# Patient Record
Sex: Female | Born: 1955 | Race: Black or African American | Hispanic: No | Marital: Married | Smoking: Former smoker
Health system: Southern US, Community
[De-identification: ages and names within clinical notes are randomized; demographics above are authoritative.]

## PROBLEM LIST (undated history)

## (undated) DIAGNOSIS — R112 Nausea with vomiting, unspecified: Secondary | ICD-10-CM

## (undated) DIAGNOSIS — E7800 Pure hypercholesterolemia, unspecified: Secondary | ICD-10-CM

## (undated) DIAGNOSIS — Z9851 Tubal ligation status: Secondary | ICD-10-CM

## (undated) DIAGNOSIS — Z8679 Personal history of other diseases of the circulatory system: Secondary | ICD-10-CM

## (undated) DIAGNOSIS — Z9889 Other specified postprocedural states: Secondary | ICD-10-CM

## (undated) DIAGNOSIS — T8859XA Other complications of anesthesia, initial encounter: Secondary | ICD-10-CM

## (undated) DIAGNOSIS — T4145XA Adverse effect of unspecified anesthetic, initial encounter: Secondary | ICD-10-CM

## (undated) DIAGNOSIS — I10 Essential (primary) hypertension: Secondary | ICD-10-CM

## (undated) HISTORY — PX: COLONOSCOPY: SHX174

## (undated) HISTORY — PX: TUBAL LIGATION: SHX77

## (undated) HISTORY — PX: BREAST SURGERY: SHX581

---
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
## 2000-10-30 ENCOUNTER — Encounter: Payer: Self-pay | Admitting: Emergency Medicine

## 2000-10-30 ENCOUNTER — Emergency Department (HOSPITAL_COMMUNITY): Admission: EM | Admit: 2000-10-30 | Discharge: 2000-10-30 | Payer: Self-pay | Admitting: Emergency Medicine

## 2005-10-28 DIAGNOSIS — D126 Benign neoplasm of colon, unspecified: Secondary | ICD-10-CM

## 2005-10-28 HISTORY — DX: Benign neoplasm of colon, unspecified: D12.6

## 2009-08-31 ENCOUNTER — Other Ambulatory Visit: Admission: RE | Admit: 2009-08-31 | Discharge: 2009-08-31 | Payer: Self-pay | Admitting: Obstetrics and Gynecology

## 2009-08-31 ENCOUNTER — Ambulatory Visit: Payer: Self-pay | Admitting: Obstetrics and Gynecology

## 2009-09-15 ENCOUNTER — Ambulatory Visit: Payer: Self-pay | Admitting: Obstetrics & Gynecology

## 2010-03-16 ENCOUNTER — Ambulatory Visit: Payer: Self-pay | Admitting: Obstetrics & Gynecology

## 2010-10-12 ENCOUNTER — Ambulatory Visit: Payer: Self-pay | Admitting: Obstetrics and Gynecology

## 2010-10-12 LAB — CONVERTED CEMR LAB: Pap Smear: NEGATIVE

## 2011-04-12 ENCOUNTER — Ambulatory Visit: Payer: Self-pay | Admitting: Advanced Practice Midwife

## 2011-05-27 ENCOUNTER — Encounter: Payer: Self-pay | Admitting: Podiatry

## 2012-01-09 ENCOUNTER — Encounter (HOSPITAL_BASED_OUTPATIENT_CLINIC_OR_DEPARTMENT_OTHER): Payer: Self-pay | Admitting: *Deleted

## 2012-01-09 ENCOUNTER — Emergency Department (HOSPITAL_BASED_OUTPATIENT_CLINIC_OR_DEPARTMENT_OTHER)
Admission: EM | Admit: 2012-01-09 | Discharge: 2012-01-09 | Disposition: A | Discharge status: Home / Self Care | Payer: 59 | Attending: Emergency Medicine | Admitting: Emergency Medicine

## 2012-01-09 DIAGNOSIS — R109 Unspecified abdominal pain: Secondary | ICD-10-CM | POA: Insufficient documentation

## 2012-01-09 DIAGNOSIS — E860 Dehydration: Secondary | ICD-10-CM

## 2012-01-09 DIAGNOSIS — K529 Noninfective gastroenteritis and colitis, unspecified: Secondary | ICD-10-CM

## 2012-01-09 DIAGNOSIS — I10 Essential (primary) hypertension: Secondary | ICD-10-CM | POA: Insufficient documentation

## 2012-01-09 DIAGNOSIS — E785 Hyperlipidemia, unspecified: Secondary | ICD-10-CM | POA: Insufficient documentation

## 2012-01-09 DIAGNOSIS — Z9851 Tubal ligation status: Secondary | ICD-10-CM | POA: Insufficient documentation

## 2012-01-09 DIAGNOSIS — K5289 Other specified noninfective gastroenteritis and colitis: Secondary | ICD-10-CM | POA: Insufficient documentation

## 2012-01-09 DIAGNOSIS — R110 Nausea: Secondary | ICD-10-CM | POA: Insufficient documentation

## 2012-01-09 DIAGNOSIS — R197 Diarrhea, unspecified: Secondary | ICD-10-CM | POA: Insufficient documentation

## 2012-01-09 DIAGNOSIS — R748 Abnormal levels of other serum enzymes: Secondary | ICD-10-CM

## 2012-01-09 HISTORY — DX: Essential (primary) hypertension: I10

## 2012-01-09 HISTORY — DX: Pure hypercholesterolemia, unspecified: E78.00

## 2012-01-09 HISTORY — DX: Tubal ligation status: Z98.51

## 2012-01-09 LAB — COMPREHENSIVE METABOLIC PANEL
BUN: 11 mg/dL (ref 6–23)
CO2: 30 mEq/L (ref 19–32)
Calcium: 9.4 mg/dL (ref 8.4–10.5)
Creatinine, Ser: 0.8 mg/dL (ref 0.50–1.10)
GFR calc Af Amer: >90 mL/min (ref >90)
GFR calc non Af Amer: 81 mL/min — ABNORMAL LOW (ref >90)
Glucose, Bld: 88 mg/dL (ref 70–99)

## 2012-01-09 LAB — DIFFERENTIAL
Eosinophils Relative: 2 % (ref 0–5)
Lymphocytes Relative: 8 % — ABNORMAL LOW (ref 12–46)
Lymphs Abs: 0.4 10*3/uL — ABNORMAL LOW (ref 0.7–4.0)
Monocytes Absolute: 0.3 10*3/uL (ref 0.1–1.0)

## 2012-01-09 LAB — URINALYSIS, ROUTINE W REFLEX MICROSCOPIC
Hgb urine dipstick: NEGATIVE
Leukocytes, UA: NEGATIVE
Protein, ur: NEGATIVE mg/dL
Urobilinogen, UA: 0.2 mg/dL (ref 0.0–1.0)

## 2012-01-09 LAB — CBC
HCT: 36.2 % (ref 36.0–46.0)
MCV: 81.7 fL (ref 78.0–100.0)
RBC: 4.43 MIL/uL (ref 3.87–5.11)
WBC: 4.3 10*3/uL (ref 4.0–10.5)

## 2012-01-09 MED ORDER — SODIUM CHLORIDE 0.9 % IV BOLUS (SEPSIS)
2000.0000 mL | Freq: Once | INTRAVENOUS | Status: AC
  Administered 2012-01-09: 1000 mL via INTRAVENOUS

## 2012-01-09 MED ORDER — FAMOTIDINE IN NACL 20-0.9 MG/50ML-% IV SOLN
20.0000 mg | Freq: Once | INTRAVENOUS | Status: AC
  Administered 2012-01-09: 20 mg via INTRAVENOUS
  Filled 2012-01-09: qty 50

## 2012-01-09 MED ORDER — KETOROLAC TROMETHAMINE 30 MG/ML IJ SOLN
30.0000 mg | Freq: Once | INTRAMUSCULAR | Status: AC
  Administered 2012-01-09: 30 mg via INTRAVENOUS
  Filled 2012-01-09: qty 1

## 2012-01-09 MED ORDER — HYDROCODONE-ACETAMINOPHEN 5-325 MG PO TABS: 2.0000 | ORAL_TABLET | ORAL | Status: AC | PRN

## 2012-01-09 MED ORDER — ONDANSETRON HCL 4 MG/2ML IJ SOLN
4.0000 mg | Freq: Once | INTRAMUSCULAR | Status: AC
  Administered 2012-01-09: 4 mg via INTRAVENOUS
  Filled 2012-01-09: qty 2

## 2012-01-09 MED ORDER — SODIUM CHLORIDE 0.9 % IV SOLN
INTRAVENOUS | Status: DC
  Administered 2012-01-09: 1000 mL via INTRAVENOUS

## 2012-01-09 MED ORDER — ONDANSETRON HCL 4 MG PO TABS: 8.0000 mg | ORAL_TABLET | Freq: Three times a day (TID) | ORAL | Status: AC | PRN

## 2012-01-09 MED ORDER — HYDROMORPHONE HCL PF 1 MG/ML IJ SOLN
0.5000 mg | Freq: Once | INTRAMUSCULAR | Status: AC
  Administered 2012-01-09: 0.5 mg via INTRAVENOUS
  Filled 2012-01-09: qty 1

## 2012-01-09 MED ORDER — FAMOTIDINE 20 MG PO TABS: 20.0000 mg | ORAL_TABLET | Freq: Two times a day (BID) | ORAL | Status: AC | PRN

## 2012-01-09 NOTE — ED Notes (Signed)
Diarrhea, headache low grade temp nausea

## 2012-01-09 NOTE — ED Notes (Signed)
Pt discharged understanding prescriptions and follow up instructions. Rated her pain at 5/10. Patient states that she feels much better just weak. Transported to check out by wheelchair.

## 2012-01-09 NOTE — Discharge Instructions (Signed)
Clear Liquid Diet The clear liquid dietconsists of foods that are liquid or will become liquid at room temperature.You should be able to see through the liquid and beverages. Examples of foods allowed on a clear liquid diet include fruit juice, broth or bouillon, gelatin, or frozen ice pops. The purpose of this diet is to provide necessary fluid, electrolytes such as sodium and potassium, and energy to keep the body functioning during times when you are not able to consume a regular diet.A clear liquid diet should not be continued for long periods of time as it is not nutritionally adequate.  REASONS FOR USING A CLEAR LIQUID DIET  In sudden onset (acute) conditions for a patient before or after surgery.   As the first step in oral feeding.   For fluid and electrolyte replacement in diarrheal diseases.   As a diet before certain medical tests are performed.  ADEQUACY The clear liquid diet is adequate only in ascorbic acid, according to the Recommended Dietary Allowances of the [employer]. CHOOSING FOODS Breads and Starches  Allowed:  None are allowed.   Avoid: All are avoided.  Vegetables  Allowed:  Strained tomato or vegetable juice.   Avoid: Any others.  Fruit  Allowed:  Strained fruit juices and fruit drinks. Include 1 serving of citrus or vitamin C-enriched fruit juice daily.   Avoid: Any others.  Meat and Meat Substitutes  Allowed:  None are allowed.   Avoid: All are avoided.  Milk  Allowed:  None are allowed.   Avoid: All are avoided.  Soups and Combination Foods  Allowed:  Clear bouillon, broth, or strained broth-based soups.   Avoid: Any others.  Desserts and Sweets  Allowed:  Sugar, honey. High protein gelatin. Flavored gelatin, ices, or frozen ice pops that do not contain milk.   Avoid: Any others.  Fats and Oils  Allowed:  None are allowed.   Avoid: All are avoided.  Beverages  Allowed: Cereal beverages, coffee (regular or  decaffeinated), tea, or soda at the discretion of your caregiver.   Avoid: Any others.  Condiments  Allowed:  Iodized salt.   Avoid: Any others, including pepper.  Supplements  Allowed:  Liquid nutrition beverages.   Avoid: Any others that contain lactose or fiber.  SAMPLE MEAL PLAN Breakfast  4 oz (120 mL) strained orange juice.    to 1 cup (125 to 250 mL) gelatin (plain or fortified).   1 cup (250 mL) beverage (coffee or tea).   Sugar, if desired.  Midmorning Snack   cup (125 mL) gelatin (plain or fortified).  Lunch  1 cup (250 mL) broth or consomm.   4 oz (120 mL) strained grapefruit juice.    cup (125 mL) gelatin (plain or fortified).   1 cup (250 mL) beverage (coffee or tea).   Sugar, if desired.  Midafternoon Snack   cup (125 mL) fruit ice.    cup (125 mL) strained fruit juice.  Dinner  1 cup (250 mL) broth or consomm.    cup (125 mL) cranberry juice.    cup (125 mL) flavored gelatin (plain or fortified).   1 cup (250 mL) beverage (coffee or tea).   Sugar, if desired.  Evening Snack  4 oz (120 mL) strained apple juice (vitamin C-fortified).    cup (125 mL) flavored gelatin (plain or fortified).  Document Released: 10/14/2005 Document Revised: 10/03/2011 Document Reviewed: 01/11/2011 [facility] Patient Information 2012 [place], [place].Dehydration, Adult Dehydration is when you lose more fluids from the body  than you take in. Vital organs like the kidneys, brain, and heart cannot function without a proper amount of fluids and salt. Any loss of fluids from the body can cause dehydration.  CAUSES   Vomiting.   Diarrhea.   Excessive sweating.   Excessive urine output.   Fever.  SYMPTOMS  Mild dehydration  Thirst.   Dry lips.   Slightly dry mouth.  Moderate dehydration  Very dry mouth.   Sunken eyes.   Skin does not bounce back quickly when lightly pinched and released.   Dark urine and decreased urine production.    Decreased tear production.   Headache.  Severe dehydration  Very dry mouth.   Extreme thirst.   Rapid, weak pulse (more than 100 beats per minute at rest).   Cold hands and feet.   Not able to sweat in spite of heat and temperature.   Rapid breathing.   Blue lips.   Confusion and lethargy.   Difficulty being awakened.   Minimal urine production.   No tears.  DIAGNOSIS  Your caregiver will diagnose dehydration based on your symptoms and your exam. Blood and urine tests will help confirm the diagnosis. The diagnostic evaluation should also identify the cause of dehydration. TREATMENT  Treatment of mild or moderate dehydration can often be done at home by increasing the amount of fluids that you drink. It is best to drink small amounts of fluid more often. Drinking too much at one time can make vomiting worse. Refer to the home care instructions below. Severe dehydration needs to be treated at the hospital where you will probably be given intravenous (IV) fluids that contain water and electrolytes. HOME CARE INSTRUCTIONS   Ask your caregiver about specific rehydration instructions.   Drink enough fluids to keep your urine clear or pale yellow.   Drink small amounts frequently if you have nausea and vomiting.   Eat as you normally do.   Avoid:   Foods or drinks high in sugar.   Carbonated drinks.   Juice.   Extremely hot or cold fluids.   Drinks with caffeine.   Fatty, greasy foods.   Alcohol.   Tobacco.   Overeating.   Gelatin desserts.   Wash your hands well to avoid spreading bacteria and viruses.   Only take over-the-counter or prescription medicines for pain, discomfort, or fever as directed by your caregiver.   Ask your caregiver if you should continue all prescribed and over-the-counter medicines.   Keep all follow-up appointments with your caregiver.  SEEK MEDICAL CARE IF:  You have abdominal pain and it increases or stays in one area  (localizes).   You have a rash, stiff neck, or severe headache.   You are irritable, sleepy, or difficult to awaken.   You are weak, dizzy, or extremely thirsty.  SEEK IMMEDIATE MEDICAL CARE IF:   You are unable to keep fluids down or you get worse despite treatment.   You have frequent episodes of vomiting or diarrhea.   You have blood or green matter (bile) in your vomit.   You have blood in your stool or your stool looks black and tarry.   You have not urinated in 6 to 8 hours, or you have only urinated a small amount of very dark urine.   You have a fever.   You faint.  MAKE SURE YOU:   Understand these instructions.   Will watch your condition.   Will get help right away if you are not doing well  or get worse.  Document Released: 10/14/2005 Document Revised: 10/03/2011 Document Reviewed: 06/03/2011 [facility] Patient Information 2012 [place], [place].Viral Gastroenteritis Viral gastroenteritis is also known as stomach flu. This condition affects the stomach and intestinal tract. It can cause sudden diarrhea and vomiting. The illness typically lasts 3 to 8 days. Most people develop an immune response that eventually gets rid of the virus. While this natural response develops, the virus can make you quite ill. CAUSES  Many different viruses can cause gastroenteritis, such as rotavirus or noroviruses. You can catch one of these viruses by consuming contaminated food or water. You may also catch a virus by sharing utensils or other personal items with an infected person or by touching a contaminated surface. SYMPTOMS  The most common symptoms are diarrhea and vomiting. These problems can cause a severe loss of body fluids (dehydration) and a body salt (electrolyte) imbalance. Other symptoms may include:  Fever.   Headache.   Fatigue.   Abdominal pain.  DIAGNOSIS  Your caregiver can usually diagnose viral gastroenteritis based on your symptoms and a physical exam. A stool  sample may also be taken to test for the presence of viruses or other infections. TREATMENT  This illness typically goes away on its own. Treatments are aimed at rehydration. The most serious cases of viral gastroenteritis involve vomiting so severely that you are not able to keep fluids down. In these cases, fluids must be given through an intravenous line (IV). HOME CARE INSTRUCTIONS   Drink enough fluids to keep your urine clear or pale yellow. Drink small amounts of fluids frequently and increase the amounts as tolerated.   Ask your caregiver for specific rehydration instructions.   Avoid:   Foods high in sugar.   Alcohol.   Carbonated drinks.   Tobacco.   Juice.   Caffeine drinks.   Extremely hot or cold fluids.   Fatty, greasy foods.   Too much intake of anything at one time.   Dairy products until 24 to 48 hours after diarrhea stops.   You may consume probiotics. Probiotics are active cultures of beneficial bacteria. They may lessen the amount and number of diarrheal stools in adults. Probiotics can be found in yogurt with active cultures and in supplements.   Wash your hands well to avoid spreading the virus.   Only take over-the-counter or prescription medicines for pain, discomfort, or fever as directed by your caregiver. Do not give aspirin to children. Antidiarrheal medicines are not recommended.   Ask your caregiver if you should continue to take your regular prescribed and over-the-counter medicines.   Keep all follow-up appointments as directed by your caregiver.  SEEK IMMEDIATE MEDICAL CARE IF:   You are unable to keep fluids down.   You do not urinate at least once every 6 to 8 hours.   You develop shortness of breath.   You notice blood in your stool or vomit. This may look like coffee grounds.   You have abdominal pain that increases or is concentrated in one small area (localized).   You have persistent vomiting or diarrhea.   You have a  fever.   The patient is a child younger than 3 months, and he or she has a fever.   The patient is a child older than 3 months, and he or she has a fever and persistent symptoms.   The patient is a child older than 3 months, and he or she has a fever and symptoms suddenly get worse.  The patient is a baby, and he or she has no tears when crying.  MAKE SURE YOU:   Understand these instructions.   Will watch your condition.   Will get help right away if you are not doing well or get worse.  Document Released: 10/14/2005 Document Revised: 10/03/2011 Document Reviewed: 07/31/2011 [facility] Patient Information 2012 [place], [place].Viral Gastroenteritis Viral gastroenteritis is also known as stomach flu. This condition affects the stomach and intestinal tract. It can cause sudden diarrhea and vomiting. The illness typically lasts 3 to 8 days. Most people develop an immune response that eventually gets rid of the virus. While this natural response develops, the virus can make you quite ill. CAUSES  Many different viruses can cause gastroenteritis, such as rotavirus or noroviruses. You can catch one of these viruses by consuming contaminated food or water. You may also catch a virus by sharing utensils or other personal items with an infected person or by touching a contaminated surface. SYMPTOMS  The most common symptoms are diarrhea and vomiting. These problems can cause a severe loss of body fluids (dehydration) and a body salt (electrolyte) imbalance. Other symptoms may include:  Fever.   Headache.   Fatigue.   Abdominal pain.  DIAGNOSIS  Your caregiver can usually diagnose viral gastroenteritis based on your symptoms and a physical exam. A stool sample may also be taken to test for the presence of viruses or other infections. TREATMENT  This illness typically goes away on its own. Treatments are aimed at rehydration. The most serious cases of viral gastroenteritis involve vomiting so  severely that you are not able to keep fluids down. In these cases, fluids must be given through an intravenous line (IV). HOME CARE INSTRUCTIONS   Drink enough fluids to keep your urine clear or pale yellow. Drink small amounts of fluids frequently and increase the amounts as tolerated.   Ask your caregiver for specific rehydration instructions.   Avoid:   Foods high in sugar.   Alcohol.   Carbonated drinks.   Tobacco.   Juice.   Caffeine drinks.   Extremely hot or cold fluids.   Fatty, greasy foods.   Too much intake of anything at one time.   Dairy products until 24 to 48 hours after diarrhea stops.   You may consume probiotics. Probiotics are active cultures of beneficial bacteria. They may lessen the amount and number of diarrheal stools in adults. Probiotics can be found in yogurt with active cultures and in supplements.   Wash your hands well to avoid spreading the virus.   Only take over-the-counter or prescription medicines for pain, discomfort, or fever as directed by your caregiver. Do not give aspirin to children. Antidiarrheal medicines are not recommended.   Ask your caregiver if you should continue to take your regular prescribed and over-the-counter medicines.   Keep all follow-up appointments as directed by your caregiver.  SEEK IMMEDIATE MEDICAL CARE IF:   You are unable to keep fluids down.   You do not urinate at least once every 6 to 8 hours.   You develop shortness of breath.   You notice blood in your stool or vomit. This may look like coffee grounds.   You have abdominal pain that increases or is concentrated in one small area (localized).   You have persistent vomiting or diarrhea.   You have a fever.   The patient is a child younger than 3 months, and he or she has a fever.   The patient is  a child older than 3 months, and he or she has a fever and persistent symptoms.   The patient is a child older than 3 months, and he or she has a  fever and symptoms suddenly get worse.   The patient is a baby, and he or she has no tears when crying.  MAKE SURE YOU:   Understand these instructions.   Will watch your condition.   Will get help right away if you are not doing well or get worse.  Document Released: 10/14/2005 Document Revised: 10/03/2011 Document Reviewed: 07/31/2011 [facility] Patient Information 2012 [place], [place].

## 2012-01-09 NOTE — ED Provider Notes (Addendum)
History     CSN: [id number]  Arrival date & time 01/09/12  1220   First MD Initiated Contact with Patient 01/09/12 1318      Chief Complaint  Patient presents with  . Nausea  . Diarrhea    (Consider location/radiation/quality/duration/timing/severity/associated sxs/prior treatment) Patient is a 56 y.o. female presenting with vomiting. The history is provided by the patient.  Emesis  This is a new problem. The current episode started 12 to 24 hours ago. The problem occurs 5 to 10 times per day. The problem has not changed since onset.The emesis has an appearance of stomach contents. The maximum temperature recorded prior to her arrival was 100 to 100.9 F. The fever has been present for less than 1 day. Associated symptoms include abdominal pain, chills, diarrhea, a fever and headaches. Pertinent negatives include no arthralgias, no cough, no myalgias, no sweats and no URI.    Past Medical History  Diagnosis Date  . Hypertension   . High cholesterol   . Tubal ligation status     Past Surgical History  Procedure Date  . Colonoscopy     No family history on file.  History  Substance Use Topics  . Smoking status: Not on file  . Smokeless tobacco: Not on file  . Alcohol Use:     OB History    Grav Para Term Preterm Abortions TAB SAB Ect Mult Living                  Review of Systems  Constitutional: Positive for fever, chills, appetite change and fatigue. Negative for diaphoresis.  HENT: Positive for sore throat. Negative for congestion, rhinorrhea, trouble swallowing, neck pain, neck stiffness, voice change and postnasal drip.   Eyes: Negative.   Respiratory: Negative for cough, chest tightness and shortness of breath.   Cardiovascular: Negative for chest pain.  Gastrointestinal: Positive for nausea, vomiting, abdominal pain and diarrhea. Negative for constipation, blood in stool, abdominal distention and anal bleeding.  Genitourinary: Negative for dysuria.    Musculoskeletal: Negative for myalgias, back pain, joint swelling and arthralgias.  Skin: Negative.   Neurological: Positive for light-headedness and headaches. Negative for dizziness and syncope.  Hematological: Negative.   Psychiatric/Behavioral: Negative.     Allergies  Codeine; Darvocet; and Morphine and related  Home Medications   Current Outpatient Rx  Name Route Sig Dispense Refill  . LOVASTATIN 20 MG PO TABS Oral Take 20 mg by mouth at bedtime.    Marland Kitchen OLMESARTAN MEDOXOMIL 20 MG PO TABS Oral Take 20 mg by mouth daily.      BP 111/73  Pulse 80  Temp(Src) 99 F (37.2 C) (Oral)  Resp 18  Ht 5\' 7"  (1.702 m)  Wt 160 lb (72.576 kg)  BMI 25.06 kg/m2  SpO2 99%  Physical Exam  Nursing note and vitals reviewed. Constitutional: She is oriented to person, place, and time. She appears well-developed and well-nourished. She is active.  Non-toxic appearance. She does not have a sickly appearance. She does not appear ill. She appears distressed.  HENT:  Head: Normocephalic and atraumatic.  Right Ear: Hearing, tympanic membrane, external ear and ear canal normal.  Left Ear: Hearing, tympanic membrane, external ear and ear canal normal.  Nose: Nose normal. No mucosal edema.  Mouth/Throat: Uvula is midline. Mucous membranes are dry. No oral lesions. No uvula swelling. No oropharyngeal exudate, posterior oropharyngeal edema, posterior oropharyngeal erythema or tonsillar abscesses.       No suggestion of pharyngitis or tonsillitis.  Eyes:  Conjunctivae and EOM are normal. Pupils are equal, round, and reactive to light. Right eye exhibits no chemosis, no discharge and no exudate. Left eye exhibits no chemosis, no discharge and no exudate. Right conjunctiva is not injected. Left conjunctiva is not injected. No scleral icterus.  Neck: Normal range of motion, full passive range of motion without pain and phonation normal. Neck supple. No rigidity. No Brudzinski's sign noted.  Cardiovascular:  Normal rate, regular rhythm, intact distal pulses and normal pulses.   No extrasystoles are present.  Pulmonary/Chest: Effort normal and breath sounds normal. No accessory muscle usage. Not tachypneic. No respiratory distress. She has no decreased breath sounds. She has no wheezes. She has no rhonchi. She has no rales. She exhibits no tenderness, no crepitus and no retraction.  Abdominal: Soft. Normal appearance. She exhibits no shifting dullness, no distension, no pulsatile liver, no fluid wave, no abdominal bruit, no ascites, no pulsatile midline mass and no mass. Bowel sounds are increased. There is no hepatosplenomegaly. There is generalized tenderness. There is no rigidity, no rebound, no guarding, no CVA tenderness, no tenderness at McBurney's point and negative Murphy's sign. No hernia.  Musculoskeletal: Normal range of motion.  Neurological: She is alert and oriented to person, place, and time. She has normal strength and normal reflexes. She is not disoriented. No cranial nerve deficit. Coordination normal. GCS eye subscore is 4. GCS verbal subscore is 5. GCS motor subscore is 6.  Skin: Skin is warm, dry and intact. No bruising, no ecchymosis, no lesion and no rash noted. She is not diaphoretic. No erythema. No pallor.  Psychiatric: She has a normal mood and affect. Her speech is normal and behavior is normal. Judgment and thought content normal. Cognition and memory are normal.    ED Course  Procedures (including critical care time)   Labs Reviewed  CBC  DIFFERENTIAL  COMPREHENSIVE METABOLIC PANEL  LIPASE, BLOOD  URINALYSIS, ROUTINE W REFLEX MICROSCOPIC   No results found.   No diagnosis found.    MDM  The patient appears to have most likely a viral gastroenteritis with mild dehydration. I will give her IV fluid repletion, anti-medics, antacids, analgesics through her IV. I will assess her electrolytes, her liver enzymes, pancreatic enzyme, kidney function, urine looking for  electrolyte abnormality, evidence of cholecystitis, choledocholithiasis, pancreatitis, renal failure, or urinary tract infection.  My overwhelming suspicion however is for uncomplicated gastroenteritis of a viral etiology with mild dehydration.        Felisa Bonier, MD 01/09/12 1423  3:19 PM At this time the patient has had no further vomiting, has no further nausea, reports improved abdominal discomfort, and is feeling well. She is only now getting the beginning of her first of 2 L of saline IV infusion to rehydrate her. She is interested in trying an oral fluid challenge and she will need more time to complete her IV fluid boluses, but at that time will be able to be discharged. Her liver enzymes are mildly inflamed, and this is attributed to likely viral etiology and not thought to represent acute cholecystitis or choledocholithiasis.  Felisa Bonier, MD 01/09/12 1520

## 2012-01-20 ENCOUNTER — Encounter (HOSPITAL_BASED_OUTPATIENT_CLINIC_OR_DEPARTMENT_OTHER): Payer: Self-pay | Admitting: Emergency Medicine

## 2012-01-20 ENCOUNTER — Emergency Department (HOSPITAL_BASED_OUTPATIENT_CLINIC_OR_DEPARTMENT_OTHER)
Admission: EM | Admit: 2012-01-20 | Discharge: 2012-01-20 | Disposition: A | Discharge status: Home / Self Care | Payer: 59 | Attending: Emergency Medicine | Admitting: Emergency Medicine

## 2012-01-20 ENCOUNTER — Emergency Department (INDEPENDENT_AMBULATORY_CARE_PROVIDER_SITE_OTHER): Payer: 59

## 2012-01-20 DIAGNOSIS — R0989 Other specified symptoms and signs involving the circulatory and respiratory systems: Secondary | ICD-10-CM

## 2012-01-20 DIAGNOSIS — I10 Essential (primary) hypertension: Secondary | ICD-10-CM | POA: Insufficient documentation

## 2012-01-20 DIAGNOSIS — R509 Fever, unspecified: Secondary | ICD-10-CM | POA: Insufficient documentation

## 2012-01-20 DIAGNOSIS — B349 Viral infection, unspecified: Secondary | ICD-10-CM

## 2012-01-20 DIAGNOSIS — R918 Other nonspecific abnormal finding of lung field: Secondary | ICD-10-CM

## 2012-01-20 DIAGNOSIS — B9789 Other viral agents as the cause of diseases classified elsewhere: Secondary | ICD-10-CM | POA: Insufficient documentation

## 2012-01-20 DIAGNOSIS — E7800 Pure hypercholesterolemia, unspecified: Secondary | ICD-10-CM | POA: Insufficient documentation

## 2012-01-20 DIAGNOSIS — J029 Acute pharyngitis, unspecified: Secondary | ICD-10-CM | POA: Insufficient documentation

## 2012-01-20 DIAGNOSIS — R52 Pain, unspecified: Secondary | ICD-10-CM

## 2012-01-20 LAB — DIFFERENTIAL
Basophils Absolute: 0 10*3/uL (ref 0.0–0.1)
Basophils Relative: 1 % (ref 0–1)
Eosinophils Absolute: 0 10*3/uL (ref 0.0–0.7)
Eosinophils Relative: 0 % (ref 0–5)
Lymphocytes Relative: 18 % (ref 12–46)

## 2012-01-20 LAB — LIPASE, BLOOD: Lipase: 18 U/L (ref 11–59)

## 2012-01-20 LAB — URINALYSIS, ROUTINE W REFLEX MICROSCOPIC
Bilirubin Urine: NEGATIVE
Glucose, UA: NEGATIVE mg/dL
Specific Gravity, Urine: 1.024 (ref 1.005–1.030)
pH: 5.5 (ref 5.0–8.0)

## 2012-01-20 LAB — COMPREHENSIVE METABOLIC PANEL
ALT: 27 U/L (ref 0–35)
AST: 33 U/L (ref 0–37)
Albumin: 4.1 g/dL (ref 3.5–5.2)
CO2: 28 mEq/L (ref 19–32)
Calcium: 9.4 mg/dL (ref 8.4–10.5)
Sodium: 138 mEq/L (ref 135–145)
Total Protein: 7.7 g/dL (ref 6.0–8.3)

## 2012-01-20 LAB — URINE MICROSCOPIC-ADD ON

## 2012-01-20 LAB — CBC
MCV: 81.7 fL (ref 78.0–100.0)
Platelets: 296 10*3/uL (ref 150–400)
RDW: 14.8 % (ref 11.5–15.5)
WBC: 3.8 10*3/uL — ABNORMAL LOW (ref 4.0–10.5)

## 2012-01-20 MED ORDER — BENZONATATE 100 MG PO CAPS: 100.0000 mg | ORAL_CAPSULE | Freq: Three times a day (TID) | ORAL | Status: AC

## 2012-01-20 MED ORDER — KETOROLAC TROMETHAMINE 30 MG/ML IJ SOLN
30.0000 mg | Freq: Once | INTRAMUSCULAR | Status: AC
  Administered 2012-01-20: 30 mg via INTRAVENOUS

## 2012-01-20 MED ORDER — ONDANSETRON HCL 4 MG/2ML IJ SOLN
INTRAMUSCULAR | Status: AC
  Administered 2012-01-20: 4 mg
  Filled 2012-01-20: qty 2

## 2012-01-20 MED ORDER — POTASSIUM CHLORIDE CRYS ER 20 MEQ PO TBCR
40.0000 meq | EXTENDED_RELEASE_TABLET | Freq: Once | ORAL | Status: AC
  Administered 2012-01-20: 40 meq via ORAL
  Filled 2012-01-20: qty 2

## 2012-01-20 MED ORDER — SODIUM CHLORIDE 0.9 % IV BOLUS (SEPSIS)
1000.0000 mL | Freq: Once | INTRAVENOUS | Status: AC
  Administered 2012-01-20: 1000 mL via INTRAVENOUS

## 2012-01-20 MED ORDER — KETOROLAC TROMETHAMINE 30 MG/ML IJ SOLN
INTRAMUSCULAR | Status: AC
  Filled 2012-01-20: qty 1

## 2012-01-20 NOTE — ED Provider Notes (Signed)
History     CSN: [id number]  Arrival date & time 01/20/12  7829   First MD Initiated Contact with Patient 01/20/12 [phone]      No chief complaint on file.   (Consider location/radiation/quality/duration/timing/severity/associated sxs/prior treatment) HPI Comments: Patient presents for 2 weeks of not feeling well.  She states she hurts all over, has headaches, has cough, has left-sided pain, has occasional nausea and vomiting.  She had an episode of diarrhea yesterday.  She has intermittent abdominal pain as well.  Patient states she was seen here 11 days ago for similar symptoms.  She did followup with her primary care doctor this week who did not give her any further treatments.  Patient states she still doesn't feel well which is why she presents today.  The left-sided pain is worse with coughing.  She denies dysuria.  She has subjective fevers.  She complains of sore throat and difficulty eating due to her nausea.  Patient is a 56 y.o. female presenting with cough. The history is provided by the patient. No language interpreter was used.  Cough This is a new problem. The current episode started more than 1 week ago. The problem occurs every few minutes. The cough is non-productive. Maximum temperature: Subjective. Associated symptoms include headaches, sore throat and myalgias. Pertinent negatives include no chest pain, no chills, no shortness of breath and no eye redness. She is not a smoker.    Past Medical History  Diagnosis Date  . Hypertension   . High cholesterol   . Tubal ligation status     Past Surgical History  Procedure Date  . Colonoscopy     History reviewed. No pertinent family history.  History  Substance Use Topics  . Smoking status: Not on file  . Smokeless tobacco: Not on file  . Alcohol Use:     OB History    Grav Para Term Preterm Abortions TAB SAB Ect Mult Living                  Review of Systems  Constitutional: Positive for fever and appetite  change. Negative for chills.  HENT: Positive for sore throat.   Eyes: Negative.  Negative for discharge and redness.  Respiratory: Positive for cough. Negative for shortness of breath.   Cardiovascular: Negative.  Negative for chest pain.  Gastrointestinal: Positive for nausea, vomiting, abdominal pain and diarrhea.  Genitourinary: Negative.  Negative for dysuria and vaginal discharge.  Musculoskeletal: Positive for myalgias. Negative for back pain.  Skin: Negative.  Negative for color change and rash.  Neurological: Positive for headaches. Negative for syncope.  Hematological: Negative.  Negative for adenopathy.  Psychiatric/Behavioral: Negative.  Negative for confusion.  All other systems reviewed and are negative.    Allergies  Codeine; Darvocet; and Morphine and related  Home Medications   Current Outpatient Rx  Name Route Sig Dispense Refill  . FAMOTIDINE 20 MG PO TABS Oral Take 1 tablet (20 mg total) by mouth 2 (two) times daily as needed for heartburn (upset stomach). 14 tablet 0  . HYDROCODONE-ACETAMINOPHEN 5-325 MG PO TABS Oral Take 2 tablets by mouth every 4 (four) hours as needed for pain. 20 tablet 0  . LOVASTATIN 20 MG PO TABS Oral Take 20 mg by mouth at bedtime.    Marland Kitchen OLMESARTAN MEDOXOMIL 20 MG PO TABS Oral Take 20 mg by mouth daily.      There were no vitals taken for this visit.  Physical Exam  Nursing note and vitals reviewed.  Constitutional: She is oriented to person, place, and time. She appears well-developed and well-nourished.  Non-toxic appearance. She does not have a sickly appearance.  HENT:  Head: Normocephalic and atraumatic.  Eyes: Conjunctivae, EOM and lids are normal. Pupils are equal, round, and reactive to light. No scleral icterus.  Neck: Trachea normal and normal range of motion. Neck supple.  Cardiovascular: Normal rate, regular rhythm and normal heart sounds.   Pulmonary/Chest: Effort normal and breath sounds normal. No respiratory distress.  She has no wheezes. She has no rales.  Abdominal: Soft. Normal appearance. She exhibits no distension. There is no tenderness. There is no rebound, no guarding and no CVA tenderness.  Genitourinary:       Mild CVA tenderness worse on the left than the right  Musculoskeletal: Normal range of motion.  Neurological: She is alert and oriented to person, place, and time. She has normal strength.  Skin: Skin is warm, dry and intact. No rash noted.  Psychiatric: She has a normal mood and affect. Her behavior is normal. Judgment and thought content normal.    ED Course  Procedures (including critical care time)  Results for orders placed during the hospital encounter of 01/20/12  CBC      Component Value Range   WBC 3.8 (*) 4.0 - 10.5 (K/uL)   RBC 4.75  3.87 - 5.11 (MIL/uL)   Hemoglobin 13.1  12.0 - 15.0 (g/dL)   HCT 16.1  09.6 - 04.5 (%)   MCV 81.7  78.0 - 100.0 (fL)   MCH 27.6  26.0 - 34.0 (pg)   MCHC 33.8  30.0 - 36.0 (g/dL)   RDW 40.9  81.1 - 91.4 (%)   Platelets 296  150 - 400 (K/uL)  DIFFERENTIAL      Component Value Range   Neutrophils Relative 67  43 - 77 (%)   Neutro Abs 2.5  1.7 - 7.7 (K/uL)   Lymphocytes Relative 18  12 - 46 (%)   Lymphs Abs 0.7  0.7 - 4.0 (K/uL)   Monocytes Relative 15 (*) 3 - 12 (%)   Monocytes Absolute 0.6  0.1 - 1.0 (K/uL)   Eosinophils Relative 0  0 - 5 (%)   Eosinophils Absolute 0.0  0.0 - 0.7 (K/uL)   Basophils Relative 1  0 - 1 (%)   Basophils Absolute 0.0  0.0 - 0.1 (K/uL)  COMPREHENSIVE METABOLIC PANEL      Component Value Range   Sodium 138  135 - 145 (mEq/L)   Potassium 3.3 (*) 3.5 - 5.1 (mEq/L)   Chloride 100  96 - 112 (mEq/L)   CO2 28  19 - 32 (mEq/L)   Glucose, Bld 94  70 - 99 (mg/dL)   BUN 4 (*) 6 - 23 (mg/dL)   Creatinine, Ser 7.82  0.50 - 1.10 (mg/dL)   Calcium 9.4  8.4 - 95.6 (mg/dL)   Total Protein 7.7  6.0 - 8.3 (g/dL)   Albumin 4.1  3.5 - 5.2 (g/dL)   AST 33  0 - 37 (U/L)   ALT 27  0 - 35 (U/L)   Alkaline Phosphatase 76  39  - 117 (U/L)   Total Bilirubin 0.2 (*) 0.3 - 1.2 (mg/dL)   GFR calc non Af Amer 62 (*) >90 (mL/min)   GFR calc Af Amer 72 (*) >90 (mL/min)  URINALYSIS, ROUTINE W REFLEX MICROSCOPIC      Component Value Range   Color, Urine YELLOW  YELLOW    APPearance CLOUDY (*) CLEAR  Specific Gravity, Urine 1.024  1.005 - 1.030    pH 5.5  5.0 - 8.0    Glucose, UA NEGATIVE  NEGATIVE (mg/dL)   Hgb urine dipstick TRACE (*) NEGATIVE    Bilirubin Urine NEGATIVE  NEGATIVE    Ketones, ur NEGATIVE  NEGATIVE (mg/dL)   Protein, ur NEGATIVE  NEGATIVE (mg/dL)   Urobilinogen, UA 0.2  0.0 - 1.0 (mg/dL)   Nitrite NEGATIVE  NEGATIVE    Leukocytes, UA NEGATIVE  NEGATIVE   LIPASE, BLOOD      Component Value Range   Lipase 18  11 - 59 (U/L)  URINE MICROSCOPIC-ADD ON      Component Value Range   Squamous Epithelial / LPF MANY (*) RARE    WBC, UA 3-6  <3 (WBC/hpf)   RBC / HPF 3-6  <3 (RBC/hpf)   Bacteria, UA MANY (*) RARE    Urine-Other MUCOUS PRESENT     Dg Chest 2 View  01/20/2012  *RADIOLOGY REPORT*  Clinical Data: Congestion, body aches, fever  CHEST - 2 VIEW  Comparison: None.  Findings: Normal cardiac silhouette.  No evidence effusion, infiltrate, or pneumothorax. There is some coarsening of the central bronchovascular markings.  Nodular density projecting over the right lower lobe measuring 10 mm.  This could represent a nipple shadow.  Degenerative spurring of the thoracic spine is noted.  IMPRESSION:  1.  Some coarsened central bronchovascular markings may represent bronchitis.  2.  Nodular density projecting in the right lower lobe may represent nipple shadow. Recommend repeat frontal projection with the nipple markers. 3.  No evidence of pneumonia.  Original Report Authenticated By: Genevive Bi, M.D.     MDM  Patient with likely viral syndrome.  She shows no signs of urinary tract infection or pneumonia on her x-ray and urinalysis.  She shows no significant signs of other laboratory abnormalities  except some mild hypokalemia.  She's been given potassium here to supplement that.  Patient has a benign abdominal exam at this point in time.  Given the patient's myriad symptoms she may have influenza, mononucleosis or some other systemic viral syndrome.  Patient's been counseled regarding rest, good hydration and Tylenol and ibuprofen for fevers and myalgias.        Nat Christen, MD 01/20/12 0830

## 2012-01-20 NOTE — Discharge Instructions (Signed)
Cough, Adult  A cough is a reflex that helps clear your throat and airways. It can help heal the body or may be a reaction to an irritated airway. A cough may only last 2 or 3 weeks (acute) or may last more than 8 weeks (chronic).  CAUSES Acute cough:  Viral or bacterial infections.  Chronic cough:  Infections.   Allergies.   Asthma.   Post-nasal drip.   Smoking.   Heartburn or acid reflux.   Some medicines.   Chronic lung problems (COPD).   Cancer.  SYMPTOMS   Cough.   Fever.   Chest pain.   Increased breathing rate.   High-pitched whistling sound when breathing (wheezing).   Colored mucus that you cough up (sputum).  TREATMENT   A bacterial cough may be treated with antibiotic medicine.   A viral cough must run its course and will not respond to antibiotics.   Your caregiver may recommend other treatments if you have a chronic cough.  HOME CARE INSTRUCTIONS   Only take over-the-counter or prescription medicines for pain, discomfort, or fever as directed by your caregiver. Use cough suppressants only as directed by your caregiver.   Use a cold steam vaporizer or humidifier in your bedroom or home to help loosen secretions.   Sleep in a semi-upright position if your cough is worse at night.   Rest as needed.   Stop smoking if you smoke.  SEEK IMMEDIATE MEDICAL CARE IF:   You have pus in your sputum.   Your cough starts to worsen.   You cannot control your cough with suppressants and are losing sleep.   You begin coughing up blood.   You have difficulty breathing.   You develop pain which is getting worse or is uncontrolled with medicine.   You have a fever.  MAKE SURE YOU:   Understand these instructions.   Will watch your condition.   Will get help right away if you are not doing well or get worse.  Document Released: 04/12/2011 Document Revised: 10/03/2011 Document Reviewed: 04/12/2011 [facility] Patient Information 2012 [place],  [place].Viral Syndrome You or your child has Viral Syndrome. It is the most common infection causing "colds" and infections in the nose, throat, sinuses, and breathing tubes. Sometimes the infection causes nausea, vomiting, or diarrhea. The germ that causes the infection is a virus. No antibiotic or other medicine will kill it. There are medicines that you can take to make you or your child more comfortable.  HOME CARE INSTRUCTIONS   Rest in bed until you start to feel better.   If you have diarrhea or vomiting, eat small amounts of crackers and toast. Soup is helpful.   Do not give aspirin or medicine that contains aspirin to children.   Only take over-the-counter or prescription medicines for pain, discomfort, or fever as directed by your caregiver.  SEEK IMMEDIATE MEDICAL CARE IF:   You or your child has not improved within one week.   You or your child has pain that is not at least partially relieved by over-the-counter medicine.   Thick, colored mucus or blood is coughed up.   Discharge from the nose becomes thick yellow or green.   Diarrhea or vomiting gets worse.   There is any major change in your or your child's condition.   You or your child develops a skin rash, stiff neck, severe headache, or are unable to hold down food or fluid.   You or your child has an oral temperature  above 102 F (38.9 C), not controlled by medicine.   Your baby is older than 3 months with a rectal temperature of 102 F (38.9 C) or higher.   Your baby is 100 months old or younger with a rectal temperature of 100.4 F (38 C) or higher.  Document Released: 09/29/2006 Document Revised: 10/03/2011 Document Reviewed: 09/30/2007 [facility] Patient Information 2012 [place], [place].

## 2012-01-20 NOTE — ED Notes (Signed)
Pt reports generalized body aches, fever, cough, back pain and sore throat x 11 days.  She was seen in ED and again with PMD but not feeling better.

## 2012-06-10 DIAGNOSIS — L732 Hidradenitis suppurativa: Secondary | ICD-10-CM | POA: Insufficient documentation

## 2013-03-28 ENCOUNTER — Encounter (HOSPITAL_BASED_OUTPATIENT_CLINIC_OR_DEPARTMENT_OTHER): Payer: Self-pay | Admitting: *Deleted

## 2013-03-28 ENCOUNTER — Emergency Department (HOSPITAL_BASED_OUTPATIENT_CLINIC_OR_DEPARTMENT_OTHER)
Admission: EM | Admit: 2013-03-28 | Discharge: 2013-03-29 | Disposition: A | Discharge status: Home / Self Care | Payer: 59 | Attending: Emergency Medicine | Admitting: Emergency Medicine

## 2013-03-28 ENCOUNTER — Emergency Department (HOSPITAL_BASED_OUTPATIENT_CLINIC_OR_DEPARTMENT_OTHER): Payer: 59

## 2013-03-28 DIAGNOSIS — R21 Rash and other nonspecific skin eruption: Secondary | ICD-10-CM | POA: Insufficient documentation

## 2013-03-28 DIAGNOSIS — R509 Fever, unspecified: Secondary | ICD-10-CM | POA: Insufficient documentation

## 2013-03-28 DIAGNOSIS — R142 Eructation: Secondary | ICD-10-CM | POA: Insufficient documentation

## 2013-03-28 DIAGNOSIS — R51 Headache: Secondary | ICD-10-CM | POA: Insufficient documentation

## 2013-03-28 DIAGNOSIS — Z9851 Tubal ligation status: Secondary | ICD-10-CM | POA: Insufficient documentation

## 2013-03-28 DIAGNOSIS — R143 Flatulence: Secondary | ICD-10-CM | POA: Insufficient documentation

## 2013-03-28 DIAGNOSIS — R141 Gas pain: Secondary | ICD-10-CM | POA: Insufficient documentation

## 2013-03-28 DIAGNOSIS — K529 Noninfective gastroenteritis and colitis, unspecified: Secondary | ICD-10-CM

## 2013-03-28 DIAGNOSIS — R1012 Left upper quadrant pain: Secondary | ICD-10-CM | POA: Insufficient documentation

## 2013-03-28 DIAGNOSIS — R52 Pain, unspecified: Secondary | ICD-10-CM | POA: Insufficient documentation

## 2013-03-28 DIAGNOSIS — K5289 Other specified noninfective gastroenteritis and colitis: Secondary | ICD-10-CM | POA: Insufficient documentation

## 2013-03-28 DIAGNOSIS — Z79899 Other long term (current) drug therapy: Secondary | ICD-10-CM | POA: Insufficient documentation

## 2013-03-28 DIAGNOSIS — I10 Essential (primary) hypertension: Secondary | ICD-10-CM | POA: Insufficient documentation

## 2013-03-28 DIAGNOSIS — R197 Diarrhea, unspecified: Secondary | ICD-10-CM | POA: Insufficient documentation

## 2013-03-28 DIAGNOSIS — E7800 Pure hypercholesterolemia, unspecified: Secondary | ICD-10-CM | POA: Insufficient documentation

## 2013-03-28 DIAGNOSIS — E876 Hypokalemia: Secondary | ICD-10-CM | POA: Insufficient documentation

## 2013-03-28 LAB — URINALYSIS, ROUTINE W REFLEX MICROSCOPIC
Bilirubin Urine: NEGATIVE
Ketones, ur: NEGATIVE mg/dL
Nitrite: NEGATIVE
Urobilinogen, UA: 0.2 mg/dL (ref 0.0–1.0)

## 2013-03-28 LAB — COMPREHENSIVE METABOLIC PANEL
AST: 36 U/L (ref 0–37)
BUN: 7 mg/dL (ref 6–23)
CO2: 29 mEq/L (ref 19–32)
Calcium: 9.7 mg/dL (ref 8.4–10.5)
Creatinine, Ser: 0.9 mg/dL (ref 0.50–1.10)
GFR calc Af Amer: 81 mL/min — ABNORMAL LOW (ref >90)
GFR calc non Af Amer: 70 mL/min — ABNORMAL LOW (ref >90)
Potassium: 2.9 mEq/L — ABNORMAL LOW (ref 3.5–5.1)
Sodium: 141 mEq/L (ref 135–145)
Total Bilirubin: 0.3 mg/dL (ref 0.3–1.2)

## 2013-03-28 LAB — CBC WITH DIFFERENTIAL/PLATELET
Basophils Absolute: 0 10*3/uL (ref 0.0–0.1)
Eosinophils Relative: 1 % (ref 0–5)
Lymphocytes Relative: 23 % (ref 12–46)
Lymphs Abs: 0.8 10*3/uL (ref 0.7–4.0)
Neutro Abs: 2.2 10*3/uL (ref 1.7–7.7)
Neutrophils Relative %: 66 % (ref 43–77)
Platelets: 230 10*3/uL (ref 150–400)
RBC: 4.33 MIL/uL (ref 3.87–5.11)
RDW: 14.9 % (ref 11.5–15.5)
WBC: 3.3 10*3/uL — ABNORMAL LOW (ref 4.0–10.5)

## 2013-03-28 LAB — URINE MICROSCOPIC-ADD ON

## 2013-03-28 LAB — LIPASE, BLOOD: Lipase: 17 U/L (ref 11–59)

## 2013-03-28 MED ORDER — HYDROMORPHONE HCL PF 1 MG/ML IJ SOLN
1.0000 mg | Freq: Once | INTRAMUSCULAR | Status: AC
  Administered 2013-03-28: 1 mg via INTRAVENOUS
  Filled 2013-03-28: qty 1

## 2013-03-28 MED ORDER — SODIUM CHLORIDE 0.9 % IV BOLUS (SEPSIS)
1000.0000 mL | Freq: Once | INTRAVENOUS | Status: AC
  Administered 2013-03-28: 1000 mL via INTRAVENOUS

## 2013-03-28 MED ORDER — IOHEXOL 300 MG/ML  SOLN: 100.0000 mL | Freq: Once | INTRAMUSCULAR | Status: AC | PRN

## 2013-03-28 MED ORDER — ONDANSETRON HCL 4 MG/2ML IJ SOLN
4.0000 mg | Freq: Once | INTRAMUSCULAR | Status: AC
  Administered 2013-03-28: 4 mg via INTRAVENOUS
  Filled 2013-03-28: qty 2

## 2013-03-28 MED ORDER — SODIUM CHLORIDE 0.9 % IV SOLN
INTRAVENOUS | Status: DC
  Administered 2013-03-29: via INTRAVENOUS

## 2013-03-28 MED ORDER — POTASSIUM CHLORIDE 10 MEQ/100ML IV SOLN
10.0000 meq | Freq: Once | INTRAVENOUS | Status: AC
  Administered 2013-03-29: 10 meq via INTRAVENOUS
  Filled 2013-03-28: qty 100

## 2013-03-28 MED ORDER — IOHEXOL 300 MG/ML  SOLN
50.0000 mL | Freq: Once | INTRAMUSCULAR | Status: AC | PRN
  Administered 2013-03-28: 50 mL via ORAL

## 2013-03-28 NOTE — ED Notes (Signed)
Patient hasn't vomited since 5am today, but has had loose stools all day. States that she able to hold down ginger ale and water. Abd pain on the LUQ continues.

## 2013-03-28 NOTE — ED Provider Notes (Signed)
History    This chart was scribed for Nancy Jakes, MD by Quintella Reichert, ED scribe.  This patient was seen in room MH09/MH09 and the patient's care was started at 9:48 PM.   CSN: [id number]  Arrival date & time 03/28/13  1842      Chief Complaint  Patient presents with  . Emesis     Patient is a 57 y.o. female presenting with vomiting. The history is provided by the patient. No language interpreter was used.  Emesis Severity:  Moderate Duration:  1 day Timing:  Intermittent Number of daily episodes:  3 Emesis appearance: white. Progression:  Worsening Chronicity:  Recurrent Relieved by:  None tried Worsened by:  Nothing tried Ineffective treatments:  None tried Associated symptoms: abdominal pain, chills, fever and headaches     HPI Comments: [name] is a 57 y.o. female who presents to the Emergency Department complaining of emesis that started yesterday, with accompanying intermittent LUQ abdominal pain, moderate-to-severe headache, intermittent generalized body aches, fever, chills and diarrhea.  Pt states that she woke up with a headache on Saturday, and subsequently developed left-sided abdominal pain with accompanying belching and gas.  She attempted to treat pain with Tylenol, without relief.  Throughout the day on Saturday pt notes that on several occasions she vomited "white stuff."  At 2:30 AM she began vomiting more frequently.  She states that she has vomited 3-4 times today.  She states that headache is waxing-and-waning and severity and exacerbated by vomiting.  Presently she rates HA at a severity of 5/10.  Pt has been to the ED 2 times before for similar symptoms and diagnosed with likely viral gastroenteritis.  She notes she has never received a CT-scan.  Pt also notes a rash under her breast that began prior to onset of current symptoms.  She denies blood in stool or vomit, visual changes, cough, congestion, rhinorrhea, CP, SOB, dysuria, hematuria,  or swelling in ankles.  She denies h/o bleeding easily.     PCP is Dr. Ivory Broad in [place], [place].   Past Medical History  Diagnosis Date  . Hypertension   . High cholesterol   . Tubal ligation status     Past Surgical History  Procedure Laterality Date  . Colonoscopy    . Tubal ligation      No family history on file.  History  Substance Use Topics  . Smoking status: Never Smoker   . Smokeless tobacco: Not on file  . Alcohol Use: No    OB History   Grav Para Term Preterm Abortions TAB SAB Ect Mult Living                  Review of Systems  Constitutional: Positive for fever and chills.  HENT: Negative for congestion and rhinorrhea.   Eyes: Negative for visual disturbance.  Respiratory: Negative for cough and shortness of breath.   Cardiovascular: Negative for chest pain and leg swelling.  Gastrointestinal: Positive for vomiting and abdominal pain. Negative for blood in stool.  Genitourinary: Negative for dysuria and hematuria.  Skin: Positive for rash.  Neurological: Positive for headaches.  Hematological: Does not bruise/bleed easily.  Psychiatric/Behavioral: Negative for confusion.    Allergies  Codeine; Darvocet; and Morphine and related  Home Medications   Current Outpatient Rx  Name  Route  Sig  Dispense  Refill  . Atorvastatin Calcium (LIPITOR PO)   Oral   Take by mouth.         Marland Kitchen LOVASTATIN  PO   Oral   Take by mouth.         . lovastatin (MEVACOR) 20 MG tablet   Oral   Take 20 mg by mouth at bedtime.         Marland Kitchen olmesartan (BENICAR) 20 MG tablet   Oral   Take 20 mg by mouth daily.           BP 125/64  Pulse 76  Temp(Src) 98.5 F (36.9 C) (Oral)  Ht 5' 7.5" (1.715 m)  Wt 180 lb (81.647 kg)  BMI 27.76 kg/m2  SpO2 95%  Physical Exam  Nursing note and vitals reviewed. Constitutional: She is oriented to person, place, and time. She appears well-developed and well-nourished. No distress.  HENT:  Head: Normocephalic and  atraumatic.  Eyes: Conjunctivae and EOM are normal. Pupils are equal, round, and reactive to light.  Neck: Normal range of motion. Neck supple.  Cardiovascular: Normal rate, regular rhythm and normal heart sounds.   Pulmonary/Chest: Effort normal and breath sounds normal. No respiratory distress. She has no wheezes. She has no rales.  Abdominal: Soft. Bowel sounds are normal. There is tenderness (Some epigastric and LUQ tenderness, no tenderness in other areas).  Neurological: She is alert and oriented to person, place, and time. No cranial nerve deficit. Coordination normal.  Skin: Skin is warm and dry.  Psychiatric: She has a normal mood and affect. Her behavior is normal.    ED Course  Procedures (including critical care time)  DIAGNOSTIC STUDIES: Oxygen Saturation is 95% on room air, adequate by my interpretation.    COORDINATION OF CARE: 9:59 PM-Discussed treatment plan which includes CT-scan of abdomen, pelvis and head with pt at bedside and pt agreed to plan.    Results for orders placed during the hospital encounter of 03/28/13  URINALYSIS, ROUTINE W REFLEX MICROSCOPIC      Result Value Range   Color, Urine YELLOW  YELLOW   APPearance CLEAR  CLEAR   Specific Gravity, Urine 1.006  1.005 - 1.030   pH 6.0  5.0 - 8.0   Glucose, UA NEGATIVE  NEGATIVE mg/dL   Hgb urine dipstick SMALL (*) NEGATIVE   Bilirubin Urine NEGATIVE  NEGATIVE   Ketones, ur NEGATIVE  NEGATIVE mg/dL   Protein, ur NEGATIVE  NEGATIVE mg/dL   Urobilinogen, UA 0.2  0.0 - 1.0 mg/dL   Nitrite NEGATIVE  NEGATIVE   Leukocytes, UA NEGATIVE  NEGATIVE  URINE MICROSCOPIC-ADD ON      Result Value Range   Squamous Epithelial / LPF FEW (*) RARE   WBC, UA 0-2  <3 WBC/hpf   RBC / HPF 3-6  <3 RBC/hpf  COMPREHENSIVE METABOLIC PANEL      Result Value Range   Sodium 141  135 - 145 mEq/L   Potassium 2.9 (*) 3.5 - 5.1 mEq/L   Chloride 100  96 - 112 mEq/L   CO2 29  19 - 32 mEq/L   Glucose, Bld 96  70 - 99 mg/dL   BUN 7   6 - 23 mg/dL   Creatinine, Ser 1.61  0.50 - 1.10 mg/dL   Calcium 9.7  8.4 - 09.6 mg/dL   Total Protein 7.1  6.0 - 8.3 g/dL   Albumin 3.6  3.5 - 5.2 g/dL   AST 36  0 - 37 U/L   ALT 24  0 - 35 U/L   Alkaline Phosphatase 59  39 - 117 U/L   Total Bilirubin 0.3  0.3 - 1.2 mg/dL  GFR calc non Af Amer 70 (*) >90 mL/min   GFR calc Af Amer 81 (*) >90 mL/min  LIPASE, BLOOD      Result Value Range   Lipase 17  11 - 59 U/L  CBC WITH DIFFERENTIAL      Result Value Range   WBC 3.3 (*) 4.0 - 10.5 K/uL   RBC 4.33  3.87 - 5.11 MIL/uL   Hemoglobin 12.0  12.0 - 15.0 g/dL   HCT 16.1 (*) 09.6 - 04.5 %   MCV 81.3  78.0 - 100.0 fL   MCH 27.7  26.0 - 34.0 pg   MCHC 34.1  30.0 - 36.0 g/dL   RDW 40.9  81.1 - 91.4 %   Platelets 230  150 - 400 K/uL   Neutrophils Relative % 66  43 - 77 %   Neutro Abs 2.2  1.7 - 7.7 K/uL   Lymphocytes Relative 23  12 - 46 %   Lymphs Abs 0.8  0.7 - 4.0 K/uL   Monocytes Relative 10  3 - 12 %   Monocytes Absolute 0.3  0.1 - 1.0 K/uL   Eosinophils Relative 1  0 - 5 %   Eosinophils Absolute 0.0  0.0 - 0.7 K/uL   Basophils Relative 1  0 - 1 %   Basophils Absolute 0.0  0.0 - 0.1 K/uL      1. Gastroenteritis   2. Hypokalemia   3. Headache       MDM  Lab workup so far significant for hypokalemia. Since patient's been having vomiting and diarrhea we'll go ahead and treat with 10 mEq IV piggyback of potassium x2. Patient awaiting CT scan and head CT scan results. White blood cell count is low this is most likely a viral illness and probably consistent with a gastroenteritis. Urinalysis negative for urinary tract infection liver function tests are normal including lipase not consistent with pancreatitis. No elevated bilirubin. Disposition will be based on CT scan.     I personally performed the services described in this documentation, which was scribed in my presence. The recorded information has been reviewed and is accurate.     Nancy Jakes, MD 03/28/13  [id number]

## 2013-03-28 NOTE — ED Notes (Signed)
C/o vomiting that started yesterday. C/o upper left abd pain that started yesterday. Pain with inspiration. C/o diarrhea. Has not been around anyone that is sick. Fevers unknown. Denies urinary symptoms. C/o general h/a since yesterday.

## 2013-03-29 MED ORDER — ONDANSETRON 4 MG PO TBDP: 4.0000 mg | ORAL_TABLET | Freq: Three times a day (TID) | ORAL | Status: DC | PRN

## 2013-03-29 MED ORDER — PROMETHAZINE HCL 25 MG PO TABS: 25.0000 mg | ORAL_TABLET | Freq: Four times a day (QID) | ORAL | Status: DC | PRN

## 2013-03-29 MED ORDER — POTASSIUM CHLORIDE CRYS ER 20 MEQ PO TBCR: 20.0000 meq | EXTENDED_RELEASE_TABLET | Freq: Two times a day (BID) | ORAL | Status: DC

## 2013-03-29 MED ORDER — IOHEXOL 300 MG/ML  SOLN
100.0000 mL | Freq: Once | INTRAMUSCULAR | Status: AC | PRN
  Administered 2013-03-29: 100 mL via INTRAVENOUS

## 2013-03-29 MED ORDER — LOPERAMIDE HCL 2 MG PO TABS: 2.0000 mg | ORAL_TABLET | Freq: Four times a day (QID) | ORAL | Status: DC | PRN

## 2014-09-03 DIAGNOSIS — I10 Essential (primary) hypertension: Secondary | ICD-10-CM | POA: Diagnosis present

## 2014-10-25 ENCOUNTER — Inpatient Hospital Stay (HOSPITAL_COMMUNITY): Admission: RE | Admit: 2014-10-25 | Payer: 59 | Source: Ambulatory Visit | Admitting: Orthopedic Surgery

## 2014-10-25 ENCOUNTER — Encounter (HOSPITAL_COMMUNITY): Admission: RE | Payer: Self-pay | Source: Ambulatory Visit

## 2014-10-25 SURGERY — ARTHROPLASTY, KNEE, TOTAL
Anesthesia: Spinal | Site: Knee | Laterality: Right

## 2014-10-28 DIAGNOSIS — D649 Anemia, unspecified: Secondary | ICD-10-CM

## 2014-10-28 HISTORY — DX: Anemia, unspecified: D64.9

## 2014-11-07 DIAGNOSIS — M179 Osteoarthritis of knee, unspecified: Secondary | ICD-10-CM | POA: Insufficient documentation

## 2015-03-08 ENCOUNTER — Encounter (HOSPITAL_BASED_OUTPATIENT_CLINIC_OR_DEPARTMENT_OTHER): Payer: Self-pay | Admitting: *Deleted

## 2015-03-08 ENCOUNTER — Emergency Department (HOSPITAL_BASED_OUTPATIENT_CLINIC_OR_DEPARTMENT_OTHER): Payer: 59

## 2015-03-08 ENCOUNTER — Emergency Department (HOSPITAL_BASED_OUTPATIENT_CLINIC_OR_DEPARTMENT_OTHER)
Admission: EM | Admit: 2015-03-08 | Discharge: 2015-03-08 | Disposition: A | Discharge status: Home / Self Care | Payer: 59 | Attending: Emergency Medicine | Admitting: Emergency Medicine

## 2015-03-08 DIAGNOSIS — S6991XA Unspecified injury of right wrist, hand and finger(s), initial encounter: Secondary | ICD-10-CM | POA: Diagnosis present

## 2015-03-08 DIAGNOSIS — Y929 Unspecified place or not applicable: Secondary | ICD-10-CM | POA: Insufficient documentation

## 2015-03-08 DIAGNOSIS — E780 Pure hypercholesterolemia: Secondary | ICD-10-CM | POA: Insufficient documentation

## 2015-03-08 DIAGNOSIS — Y999 Unspecified external cause status: Secondary | ICD-10-CM | POA: Insufficient documentation

## 2015-03-08 DIAGNOSIS — S62626A Displaced fracture of medial phalanx of right little finger, initial encounter for closed fracture: Secondary | ICD-10-CM

## 2015-03-08 DIAGNOSIS — I10 Essential (primary) hypertension: Secondary | ICD-10-CM | POA: Diagnosis not present

## 2015-03-08 DIAGNOSIS — W010XXA Fall on same level from slipping, tripping and stumbling without subsequent striking against object, initial encounter: Secondary | ICD-10-CM | POA: Insufficient documentation

## 2015-03-08 DIAGNOSIS — Z79899 Other long term (current) drug therapy: Secondary | ICD-10-CM | POA: Diagnosis not present

## 2015-03-08 DIAGNOSIS — S62656A Nondisplaced fracture of medial phalanx of right little finger, initial encounter for closed fracture: Secondary | ICD-10-CM | POA: Insufficient documentation

## 2015-03-08 DIAGNOSIS — Y939 Activity, unspecified: Secondary | ICD-10-CM | POA: Insufficient documentation

## 2015-03-08 NOTE — Discharge Instructions (Signed)
Rest, ice and elevate your finger. Take ibuprofen, 600-800 mg every 6-8 hours for pain and swelling.  Finger Fracture Fractures of fingers are breaks in the bones of the fingers. There are many types of fractures. There are different ways of treating these fractures. Your health care provider will discuss the best way to treat your fracture. CAUSES Traumatic injury is the main cause of broken fingers. These include:  Injuries while playing sports.  Workplace injuries.  Falls. RISK FACTORS Activities that can increase your risk of finger fractures include:  Sports.  Workplace activities that involve machinery.  A condition called osteoporosis, which can make your bones less dense and cause them to fracture more easily. SIGNS AND SYMPTOMS The main symptoms of a broken finger are pain and swelling within 15 minutes after the injury. Other symptoms include:  Bruising of your finger.  Stiffness of your finger.  Numbness of your finger.  Exposed bones (compound fracture) if the fracture is severe. DIAGNOSIS  The best way to diagnose a broken bone is with X-ray imaging. Additionally, your health care provider will use this X-ray image to evaluate the position of the broken finger bones.  TREATMENT  Finger fractures can be treated with:   Nonreduction--This means the bones are in place. The finger is splinted without changing the positions of the bone pieces. The splint is usually left on for about a week to 10 days. This will depend on your fracture and what your health care provider thinks.  Closed reduction--The bones are put back into position without using surgery. The finger is then splinted.  Open reduction and internal fixation--The fracture site is opened. Then the bone pieces are fixed into place with pins or some type of hardware. This is seldom required. It depends on the severity of the fracture. HOME CARE INSTRUCTIONS   Follow your health care provider's instructions  regarding activities, exercises, and physical therapy.  Only take over-the-counter or prescription medicines for pain, discomfort, or fever as directed by your health care provider. SEEK MEDICAL CARE IF: You have pain or swelling that limits the motion or use of your fingers. SEEK IMMEDIATE MEDICAL CARE IF:  Your finger becomes numb. MAKE SURE YOU:   Understand these instructions.  Will watch your condition.  Will get help right away if you are not doing well or get worse. Document Released: 01/26/2001 Document Revised: 08/04/2013 Document Reviewed: 05/26/2013 [facility] Patient Information 2015 [place], [place]. This information is not intended to replace advice given to you by your health care provider. Make sure you discuss any questions you have with your health care provider.

## 2015-03-08 NOTE — ED Provider Notes (Signed)
CSN: [id number]     Arrival date & time 03/08/15  1528 History   First MD Initiated Contact with Patient 03/08/15 1615     Chief Complaint  Patient presents with  . Hand Injury     (Consider location/radiation/quality/duration/timing/severity/associated sxs/prior Treatment) HPI Comments: 59 year old female complaining of right pinky finger pain after tripping and falling up the stairs and landing on her finger 5 days ago. States she then went to the beach over the weekend and was unable to have it evaluated. Reports increased pain and swelling to her finger. Pain worse with movement. No alleviating factors tried. Denies numbness or tingling. Pain is described as a throbbing sensation.  Patient is a 59 y.o. female presenting with hand injury. The history is provided by the patient.  Hand Injury   Past Medical History  Diagnosis Date  . Hypertension   . High cholesterol   . Tubal ligation status    Past Surgical History  Procedure Laterality Date  . Colonoscopy    . Tubal ligation     History reviewed. No pertinent family history. History  Substance Use Topics  . Smoking status: Never Smoker   . Smokeless tobacco: Not on file  . Alcohol Use: No   OB History    No data available     Review of Systems  Constitutional: Negative.   HENT: Negative.   Respiratory: Negative.   Musculoskeletal:       + R pinky finger pain and swelling.  Skin: Negative for color change.  Neurological: Negative for numbness.      Allergies  Codeine; Darvocet; and Morphine and related  Home Medications   Prior to Admission medications   Medication Sig Start Date End Date Taking? Authorizing Provider  Atorvastatin Calcium (LIPITOR PO) Take by mouth.    Historical Provider, MD  loperamide (IMODIUM A-D) 2 MG tablet Take 1 tablet (2 mg total) by mouth 4 (four) times daily as needed for diarrhea or loose stools. 03/28/13   Fredia Sorrow, MD  lovastatin (MEVACOR) 20 MG tablet Take 20 mg by  mouth at bedtime.    Historical Provider, MD  LOVASTATIN PO Take by mouth.    Historical Provider, MD  olmesartan (BENICAR) 20 MG tablet Take 20 mg by mouth daily.    Historical Provider, MD  ondansetron (ZOFRAN ODT) 4 MG disintegrating tablet Take 1 tablet (4 mg total) by mouth every 8 (eight) hours as needed for nausea. 03/29/13   Fredia Sorrow, MD  potassium chloride SA (K-DUR,KLOR-CON) 20 MEQ tablet Take 1 tablet (20 mEq total) by mouth 2 (two) times daily. 03/29/13   Fredia Sorrow, MD  promethazine (PHENERGAN) 25 MG tablet Take 1 tablet (25 mg total) by mouth every 6 (six) hours as needed for nausea. 03/29/13   Fredia Sorrow, MD   BP 112/67 mmHg  Pulse 75  Temp(Src) 98.2 F (36.8 C) (Oral)  Resp 16  Ht 5\' 7"  (1.702 m)  Wt 180 lb (81.647 kg)  BMI 28.19 kg/m2  SpO2 100% Physical Exam  Constitutional: She is oriented to person, place, and time. She appears well-developed and well-nourished. No distress.  HENT:  Head: Normocephalic and atraumatic.  Mouth/Throat: Oropharynx is clear and moist.  Eyes: Conjunctivae and EOM are normal.  Neck: Normal range of motion. Neck supple.  Cardiovascular: Normal rate, regular rhythm and normal heart sounds.   Pulmonary/Chest: Effort normal and breath sounds normal. No respiratory distress.  Musculoskeletal:  Right fifth digit with swelling from PIP distally. TTP over middle  phalanx. No deformity. Range of motion limited by pain. Brisk cap refill less than 2 seconds.  Neurological: She is alert and oriented to person, place, and time. No sensory deficit.  Skin: Skin is warm and dry.  Psychiatric: She has a normal mood and affect. Her behavior is normal.  Nursing note and vitals reviewed.   ED Course  Procedures (including critical care time) Labs Review Labs Reviewed - No data to display  Imaging Review Dg Hand Complete Right  03/08/2015   CLINICAL DATA:  Patient tripped and fell, hitting medial right hand.  EXAM: RIGHT HAND - COMPLETE 3+  VIEW  COMPARISON:  None.  FINDINGS: Frontal, oblique, and lateral views obtained. There is a nondisplaced fracture of the mid to distal aspect of the fifth middle phalanx. There is no other apparent fracture. No dislocation. There is no appreciable arthropathy.  IMPRESSION: Nondisplaced fracture mid to distal aspect fifth middle phalanx.   Electronically Signed   By: Lowella Grip III M.D.   On: 03/08/2015 16:07     EKG Interpretation None      MDM   Final diagnoses:  Fracture of fifth finger, middle phalanx, right, closed, initial encounter   Neurovascularly intact. X-ray results as stated above. Finger splint applied. Advised rice, NSAIDs. Follow-up with Dr. Barbaraann Barthel. Stable for discharge. Return precautions given. Patient states understanding of treatment care plan and is agreeable.  Carman Ching, PA-C 03/08/15 1640  Ripley Fraise, MD 03/08/15 1944

## 2015-03-08 NOTE — ED Notes (Signed)
Patient transported to X-ray 

## 2015-03-08 NOTE — ED Notes (Signed)
Pt c/o fall up one step right hand injury

## 2015-05-26 DIAGNOSIS — R7303 Prediabetes: Secondary | ICD-10-CM | POA: Diagnosis present

## 2017-04-02 ENCOUNTER — Other Ambulatory Visit: Payer: Self-pay | Admitting: Internal Medicine

## 2017-04-02 DIAGNOSIS — Z1231 Encounter for screening mammogram for malignant neoplasm of breast: Secondary | ICD-10-CM

## 2017-06-03 ENCOUNTER — Ambulatory Visit
Admission: RE | Admit: 2017-06-03 | Discharge: 2017-06-03 | Disposition: A | Discharge status: Home / Self Care | Payer: No Typology Code available for payment source | Source: Ambulatory Visit | Attending: Internal Medicine | Admitting: Internal Medicine

## 2017-06-03 DIAGNOSIS — Z1231 Encounter for screening mammogram for malignant neoplasm of breast: Secondary | ICD-10-CM

## 2017-09-15 ENCOUNTER — Encounter (HOSPITAL_BASED_OUTPATIENT_CLINIC_OR_DEPARTMENT_OTHER): Payer: Self-pay | Admitting: *Deleted

## 2017-09-15 ENCOUNTER — Other Ambulatory Visit: Payer: Self-pay

## 2017-09-15 ENCOUNTER — Emergency Department (HOSPITAL_BASED_OUTPATIENT_CLINIC_OR_DEPARTMENT_OTHER)
Admission: EM | Admit: 2017-09-15 | Discharge: 2017-09-15 | Disposition: A | Discharge status: Home / Self Care | Payer: No Typology Code available for payment source | Attending: Emergency Medicine | Admitting: Emergency Medicine

## 2017-09-15 DIAGNOSIS — Z79899 Other long term (current) drug therapy: Secondary | ICD-10-CM | POA: Insufficient documentation

## 2017-09-15 DIAGNOSIS — M62830 Muscle spasm of back: Secondary | ICD-10-CM | POA: Insufficient documentation

## 2017-09-15 DIAGNOSIS — I10 Essential (primary) hypertension: Secondary | ICD-10-CM | POA: Insufficient documentation

## 2017-09-15 DIAGNOSIS — M62838 Other muscle spasm: Secondary | ICD-10-CM

## 2017-09-15 MED ORDER — KETOROLAC TROMETHAMINE 60 MG/2ML IM SOLN
30.0000 mg | Freq: Once | INTRAMUSCULAR | Status: AC
  Administered 2017-09-15: 30 mg via INTRAMUSCULAR
  Filled 2017-09-15: qty 2

## 2017-09-15 MED ORDER — DEXAMETHASONE SODIUM PHOSPHATE 10 MG/ML IJ SOLN
10.0000 mg | Freq: Once | INTRAMUSCULAR | Status: AC
  Administered 2017-09-15: 10 mg via INTRAMUSCULAR
  Filled 2017-09-15: qty 1

## 2017-09-15 MED ORDER — CYCLOBENZAPRINE HCL 5 MG PO TABS
5.0000 mg | ORAL_TABLET | Freq: Once | ORAL | Status: AC
  Administered 2017-09-15: 5 mg via ORAL

## 2017-09-15 MED ORDER — CYCLOBENZAPRINE HCL 10 MG PO TABS
ORAL_TABLET | ORAL | Status: AC
  Filled 2017-09-15: qty 1

## 2017-09-15 MED ORDER — ACETAMINOPHEN 500 MG PO TABS: 1000.0000 mg | ORAL_TABLET | Freq: Three times a day (TID) | ORAL | 0 refills | Status: AC

## 2017-09-15 MED ORDER — CYCLOBENZAPRINE HCL 10 MG PO TABS: 5.0000 mg | ORAL_TABLET | Freq: Every day | ORAL | 0 refills | Status: AC

## 2017-09-15 MED ORDER — IBUPROFEN 600 MG PO TABS: 600.0000 mg | ORAL_TABLET | Freq: Four times a day (QID) | ORAL | 0 refills | Status: DC | PRN

## 2017-09-15 NOTE — ED Provider Notes (Signed)
[facility] EMERGENCY DEPARTMENT Provider Note  CSN: [id number] Arrival date & time: 09/15/17 1629  Chief Complaint(s) No chief complaint on file.  HPI Nancy Tran is a 61 y.o. female with a history of chronic left upper back/shoulder pain being managed by primary care provider and orthopedic surgery presents to the emergency department with persistent pain.  She denies any trauma.  Describes the pain as aching/spastic/cramping pain.  Moderate to severe in intensity.  Exacerbated with certain positions and movement.  Also exacerbated with palpation of the parascapular muscles.  Patient reports that the most alleviating factor was steroid shot in the left shoulder performed 6 months ago.  She denies any chest pain, shortness of breath, recent fevers or infections.  Denies any focal weakness.  Denies any other alleviating or aggravating factors.  Denies any other physical complaints.  HPI  Past Medical History Past Medical History:  Diagnosis Date  . High cholesterol   . High cholesterol   . Hypertension   . Tubal ligation status    Patient Active Problem List   Diagnosis Date Noted  . Tubal ligation status    Home Medication(s) Prior to Admission medications   Medication Sig Start Date End Date Taking? Authorizing Provider  LOSARTAN POTASSIUM PO Take by mouth.   Yes [provider]  PRAVASTATIN SODIUM PO Take by mouth.   Yes [provider]  acetaminophen (TYLENOL) 500 MG tablet Take 2 tablets (1,000 mg total) every 8 (eight) hours for 5 days by mouth. Do not take more than 4000 mg of acetaminophen (Tylenol) in a 24-hour period. Please note that other medicines that you may be prescribed may have Tylenol as well. 09/15/17 09/20/17  Rayven Hendrickson, Grayce Sessions, MD  Atorvastatin Calcium (LIPITOR PO) Take by mouth.    [provider]  cyclobenzaprine (FLEXERIL) 10 MG tablet Take 0.5-1 tablets (5-10 mg total) at bedtime for 10 days by mouth. 09/15/17  09/25/17  Horrace Hanak, Grayce Sessions, MD  ibuprofen (ADVIL,MOTRIN) 600 MG tablet Take 1 tablet (600 mg total) every 6 (six) hours as needed by mouth. 09/15/17   Philisha Weinel, Grayce Sessions, MD  loperamide (IMODIUM A-D) 2 MG tablet Take 1 tablet (2 mg total) by mouth 4 (four) times daily as needed for diarrhea or loose stools. 03/28/13   Fredia Sorrow, MD  lovastatin (MEVACOR) 20 MG tablet Take 20 mg by mouth at bedtime.    [provider]  LOVASTATIN PO Take by mouth.    [provider]  olmesartan (BENICAR) 20 MG tablet Take 20 mg by mouth daily.    [provider]  ondansetron (ZOFRAN ODT) 4 MG disintegrating tablet Take 1 tablet (4 mg total) by mouth every 8 (eight) hours as needed for nausea. 03/29/13   Fredia Sorrow, MD  potassium chloride SA (K-DUR,KLOR-CON) 20 MEQ tablet Take 1 tablet (20 mEq total) by mouth 2 (two) times daily. 03/29/13   Fredia Sorrow, MD  promethazine (PHENERGAN) 25 MG tablet Take 1 tablet (25 mg total) by mouth every 6 (six) hours as needed for nausea. 03/29/13   Fredia Sorrow, MD  Past Surgical History Past Surgical History:  Procedure Laterality Date  . COLONOSCOPY    . TUBAL LIGATION     Family History No family history on file.  Social History Social History   Tobacco Use  . Smoking status: Never Smoker  . Smokeless tobacco: Never Used  Substance Use Topics  . Alcohol use: Yes  . Drug use: No   Allergies Codeine; Darvocet [propoxyphene n-acetaminophen]; and Morphine and related  Review of Systems Review of Systems All other systems are reviewed and are negative for acute change except as noted in the HPI  Physical Exam Vital Signs  I have reviewed the triage vital signs BP 136/82   Pulse 77   Temp 98.4 F (36.9 C) (Oral)   Resp 20   Ht 5\' 7"  (1.702 m)   Wt 81.6 kg (180 lb)   SpO2 99%   BMI  28.19 kg/m   Physical Exam  Constitutional: She is oriented to person, place, and time. She appears well-developed and well-nourished. No distress.  HENT:  Head: Normocephalic and atraumatic.  Right Ear: External ear normal.  Left Ear: External ear normal.  Nose: Nose normal.  Eyes: Conjunctivae and EOM are normal. No scleral icterus.  Neck: Normal range of motion and phonation normal.  Cardiovascular: Normal rate and regular rhythm.  Pulmonary/Chest: Effort normal. No stridor. No respiratory distress.  Abdominal: She exhibits no distension.  Musculoskeletal: Normal range of motion. She exhibits no edema.       Cervical back: She exhibits tenderness and spasm. She exhibits no bony tenderness.       Back:  Neurological: She is alert and oriented to person, place, and time.  Skin: She is not diaphoretic.  Psychiatric: She has a normal mood and affect. Her behavior is normal.  Vitals reviewed.   ED Results and Treatments Labs (all labs ordered are listed, but only abnormal results are displayed) Labs Reviewed - No data to display                                                                                                                       EKG  EKG Interpretation  Date/Time:    Ventricular Rate:    PR Interval:    QRS Duration:   QT Interval:    QTC Calculation:   R Axis:     Text Interpretation:        Radiology No results found. Pertinent labs & imaging results that were available during my care of the patient were reviewed by me and considered in my medical decision making (see chart for details).  Medications Ordered in ED Medications  cyclobenzaprine (FLEXERIL) 10 MG tablet (not administered)  dexamethasone (DECADRON) injection 10 mg (10 mg Intramuscular Given 09/15/17 1832)  ketorolac (TORADOL) injection 30 mg (30 mg Intramuscular Given 09/15/17 1832)  cyclobenzaprine (FLEXERIL) tablet 5 mg (5 mg Oral Given 09/15/17 1831)  Procedures Procedures  (including critical care time)  Medical Decision Making / ED Course I have reviewed the nursing notes for this encounter and the patient's prior records (if available in EHR or on provided paperwork).    Presentation is consistent with muscular strain/spasm versus undiagnosed herniated disc.  No need for emergent imaging at this time.  Recommended continued symptomatic/supportive management.  Patient provided with IM Decadron and Toradol here.  We will provide the patient with prescription for muscle relaxers.  Recommended follow-up with primary care provider and orthopedic surgery for continued management.  The patient appears reasonably screened and/or stabilized for discharge and I doubt any other medical condition or other [facility] requiring further screening, evaluation, or treatment in the ED at this time prior to discharge.  The patient is safe for discharge with strict return precautions.   Final Clinical Impression(s) / ED Diagnoses Final diagnoses:  Muscle spasm    Disposition: Discharge  Condition: Good  I have discussed the results, Dx and Tx plan with the patient who expressed understanding and agree(s) with the plan. Discharge instructions discussed at great length. The patient was given strict return precautions who verbalized understanding of the instructions. No further questions at time of discharge.    ED Discharge Orders        Ordered    cyclobenzaprine (FLEXERIL) 10 MG tablet  Daily at bedtime     09/15/17 1837    acetaminophen (TYLENOL) 500 MG tablet  Every 8 hours     09/15/17 1837    ibuprofen (ADVIL,MOTRIN) 600 MG tablet  Every 6 hours PRN     09/15/17 1837       Follow Up: Lilian Coma., MD [place] [address] [place] [address] [phone]  Schedule an appointment as soon as possible for a visit  As  needed     This chart was dictated using voice recognition software.  Despite best efforts to proofread,  errors can occur which can change the documentation meaning.   Fatima Blank, MD 09/15/17 [phone]

## 2017-09-15 NOTE — Discharge Instructions (Signed)
You may use over-the-counter Motrin (Ibuprofen), Acetaminophen (Tylenol), topical muscle creams such as SalonPas, Icy Hot, Bengay, etc. Please stretch, apply heat, and have massage therapy for additional assistance. ° °

## 2017-09-15 NOTE — ED Triage Notes (Signed)
Muscle spasms in her left upper arm and neck for a long time. She had a cortisone injection 6 months ago but has not been able to go back.

## 2018-06-10 DIAGNOSIS — R1310 Dysphagia, unspecified: Secondary | ICD-10-CM | POA: Insufficient documentation

## 2018-06-10 DIAGNOSIS — E041 Nontoxic single thyroid nodule: Secondary | ICD-10-CM | POA: Insufficient documentation

## 2018-06-24 DIAGNOSIS — Z86010 Personal history of colonic polyps: Secondary | ICD-10-CM | POA: Insufficient documentation

## 2018-07-28 DIAGNOSIS — D709 Neutropenia, unspecified: Secondary | ICD-10-CM | POA: Insufficient documentation

## 2018-08-28 DIAGNOSIS — K2960 Other gastritis without bleeding: Secondary | ICD-10-CM

## 2018-08-28 HISTORY — DX: Other gastritis without bleeding: K29.60

## 2018-10-12 DIAGNOSIS — Z124 Encounter for screening for malignant neoplasm of cervix: Secondary | ICD-10-CM | POA: Insufficient documentation

## 2018-11-02 DIAGNOSIS — K2970 Gastritis, unspecified, without bleeding: Secondary | ICD-10-CM | POA: Insufficient documentation

## 2019-04-12 DIAGNOSIS — D649 Anemia, unspecified: Secondary | ICD-10-CM | POA: Diagnosis present

## 2019-07-20 DIAGNOSIS — M7989 Other specified soft tissue disorders: Secondary | ICD-10-CM | POA: Insufficient documentation

## 2019-10-14 DIAGNOSIS — N289 Disorder of kidney and ureter, unspecified: Secondary | ICD-10-CM | POA: Insufficient documentation

## 2019-10-28 DIAGNOSIS — F411 Generalized anxiety disorder: Secondary | ICD-10-CM | POA: Diagnosis present

## 2019-10-28 DIAGNOSIS — G44209 Tension-type headache, unspecified, not intractable: Secondary | ICD-10-CM | POA: Insufficient documentation

## 2019-11-07 DIAGNOSIS — Z8249 Family history of ischemic heart disease and other diseases of the circulatory system: Secondary | ICD-10-CM | POA: Insufficient documentation

## 2019-12-09 DIAGNOSIS — I671 Cerebral aneurysm, nonruptured: Secondary | ICD-10-CM | POA: Diagnosis present

## 2019-12-27 DIAGNOSIS — K5792 Diverticulitis of intestine, part unspecified, without perforation or abscess without bleeding: Secondary | ICD-10-CM

## 2019-12-27 HISTORY — DX: Diverticulitis of intestine, part unspecified, without perforation or abscess without bleeding: K57.92

## 2019-12-29 HISTORY — PX: CEREBRAL ANEURYSM REPAIR: SHX164

## 2020-01-09 ENCOUNTER — Inpatient Hospital Stay (HOSPITAL_BASED_OUTPATIENT_CLINIC_OR_DEPARTMENT_OTHER)
Admission: EM | Admit: 2020-01-09 | Discharge: 2020-01-18 | DRG: 378 | Disposition: A | Discharge status: Home / Self Care | Payer: BC Managed Care – PPO | Attending: Internal Medicine | Admitting: Internal Medicine

## 2020-01-09 ENCOUNTER — Emergency Department (HOSPITAL_BASED_OUTPATIENT_CLINIC_OR_DEPARTMENT_OTHER): Payer: BC Managed Care – PPO

## 2020-01-09 ENCOUNTER — Encounter (HOSPITAL_BASED_OUTPATIENT_CLINIC_OR_DEPARTMENT_OTHER): Payer: Self-pay

## 2020-01-09 ENCOUNTER — Other Ambulatory Visit: Payer: Self-pay

## 2020-01-09 DIAGNOSIS — E785 Hyperlipidemia, unspecified: Secondary | ICD-10-CM | POA: Diagnosis present

## 2020-01-09 DIAGNOSIS — Z7902 Long term (current) use of antithrombotics/antiplatelets: Secondary | ICD-10-CM

## 2020-01-09 DIAGNOSIS — E041 Nontoxic single thyroid nodule: Secondary | ICD-10-CM | POA: Diagnosis present

## 2020-01-09 DIAGNOSIS — I671 Cerebral aneurysm, nonruptured: Secondary | ICD-10-CM | POA: Diagnosis not present

## 2020-01-09 DIAGNOSIS — D649 Anemia, unspecified: Secondary | ICD-10-CM | POA: Diagnosis present

## 2020-01-09 DIAGNOSIS — K5733 Diverticulitis of large intestine without perforation or abscess with bleeding: Secondary | ICD-10-CM

## 2020-01-09 DIAGNOSIS — R7303 Prediabetes: Secondary | ICD-10-CM | POA: Diagnosis not present

## 2020-01-09 DIAGNOSIS — N179 Acute kidney failure, unspecified: Secondary | ICD-10-CM | POA: Diagnosis not present

## 2020-01-09 DIAGNOSIS — Z881 Allergy status to other antibiotic agents status: Secondary | ICD-10-CM | POA: Diagnosis not present

## 2020-01-09 DIAGNOSIS — N289 Disorder of kidney and ureter, unspecified: Secondary | ICD-10-CM | POA: Diagnosis not present

## 2020-01-09 DIAGNOSIS — R195 Other fecal abnormalities: Secondary | ICD-10-CM | POA: Diagnosis not present

## 2020-01-09 DIAGNOSIS — Z880 Allergy status to penicillin: Secondary | ICD-10-CM | POA: Diagnosis not present

## 2020-01-09 DIAGNOSIS — Z79899 Other long term (current) drug therapy: Secondary | ICD-10-CM | POA: Diagnosis not present

## 2020-01-09 DIAGNOSIS — Z86010 Personal history of colonic polyps: Secondary | ICD-10-CM | POA: Diagnosis not present

## 2020-01-09 DIAGNOSIS — F411 Generalized anxiety disorder: Secondary | ICD-10-CM | POA: Diagnosis present

## 2020-01-09 DIAGNOSIS — I10 Essential (primary) hypertension: Secondary | ICD-10-CM | POA: Diagnosis not present

## 2020-01-09 DIAGNOSIS — Z888 Allergy status to other drugs, medicaments and biological substances status: Secondary | ICD-10-CM | POA: Diagnosis not present

## 2020-01-09 DIAGNOSIS — Z7289 Other problems related to lifestyle: Secondary | ICD-10-CM | POA: Diagnosis not present

## 2020-01-09 DIAGNOSIS — E876 Hypokalemia: Secondary | ICD-10-CM | POA: Diagnosis not present

## 2020-01-09 DIAGNOSIS — K5792 Diverticulitis of intestine, part unspecified, without perforation or abscess without bleeding: Secondary | ICD-10-CM | POA: Diagnosis not present

## 2020-01-09 DIAGNOSIS — Z20822 Contact with and (suspected) exposure to covid-19: Secondary | ICD-10-CM | POA: Diagnosis present

## 2020-01-09 DIAGNOSIS — Z885 Allergy status to narcotic agent status: Secondary | ICD-10-CM | POA: Diagnosis not present

## 2020-01-09 DIAGNOSIS — D62 Acute posthemorrhagic anemia: Secondary | ICD-10-CM | POA: Diagnosis present

## 2020-01-09 HISTORY — DX: Personal history of other diseases of the circulatory system: Z86.79

## 2020-01-09 HISTORY — DX: Other complications of anesthesia, initial encounter: T88.59XA

## 2020-01-09 HISTORY — DX: Nausea with vomiting, unspecified: Z98.890

## 2020-01-09 HISTORY — DX: Nausea with vomiting, unspecified: R11.2

## 2020-01-09 LAB — CBC WITH DIFFERENTIAL/PLATELET
Abs Immature Granulocytes: 0.03 10*3/uL (ref 0.00–0.07)
Basophils Absolute: 0 10*3/uL (ref 0.0–0.1)
Basophils Relative: 0 %
Eosinophils Absolute: 0.1 10*3/uL (ref 0.0–0.5)
Eosinophils Relative: 1 %
HCT: 28.5 % — ABNORMAL LOW (ref 36.0–46.0)
Hemoglobin: 9.2 g/dL — ABNORMAL LOW (ref 12.0–15.0)
Immature Granulocytes: 0 %
Lymphocytes Relative: 21 %
Lymphs Abs: 1.7 10*3/uL (ref 0.7–4.0)
MCH: 27.4 pg (ref 26.0–34.0)
MCHC: 32.3 g/dL (ref 30.0–36.0)
MCV: 84.8 fL (ref 80.0–100.0)
Monocytes Absolute: 0.6 10*3/uL (ref 0.1–1.0)
Monocytes Relative: 8 %
Neutro Abs: 5.6 10*3/uL (ref 1.7–7.7)
Neutrophils Relative %: 70 %
Platelets: 378 10*3/uL (ref 150–400)
RBC: 3.36 MIL/uL — ABNORMAL LOW (ref 3.87–5.11)
RDW: 14.9 % (ref 11.5–15.5)
WBC: 8.1 10*3/uL (ref 4.0–10.5)
nRBC: 0 % (ref 0.0–0.2)

## 2020-01-09 LAB — URINALYSIS, ROUTINE W REFLEX MICROSCOPIC
Bilirubin Urine: NEGATIVE
Glucose, UA: NEGATIVE mg/dL
Hgb urine dipstick: NEGATIVE
Ketones, ur: NEGATIVE mg/dL
Leukocytes,Ua: NEGATIVE
Nitrite: NEGATIVE
Protein, ur: NEGATIVE mg/dL
Specific Gravity, Urine: 1.02 (ref 1.005–1.030)
pH: 5.5 (ref 5.0–8.0)

## 2020-01-09 LAB — COMPREHENSIVE METABOLIC PANEL
ALT: 17 U/L (ref 0–44)
AST: 19 U/L (ref 15–41)
Albumin: 4.2 g/dL (ref 3.5–5.0)
Alkaline Phosphatase: 55 U/L (ref 38–126)
Anion gap: 11 (ref 5–15)
BUN: 24 mg/dL — ABNORMAL HIGH (ref 8–23)
CO2: 24 mmol/L (ref 22–32)
Calcium: 9.7 mg/dL (ref 8.9–10.3)
Chloride: 102 mmol/L (ref 98–111)
Creatinine, Ser: 1.21 mg/dL — ABNORMAL HIGH (ref 0.44–1.00)
GFR calc Af Amer: 55 mL/min — ABNORMAL LOW (ref >60)
GFR calc non Af Amer: 47 mL/min — ABNORMAL LOW (ref >60)
Glucose, Bld: 107 mg/dL — ABNORMAL HIGH (ref 70–99)
Potassium: 3.3 mmol/L — ABNORMAL LOW (ref 3.5–5.1)
Sodium: 137 mmol/L (ref 135–145)
Total Bilirubin: 0.3 mg/dL (ref 0.3–1.2)
Total Protein: 7.2 g/dL (ref 6.5–8.1)

## 2020-01-09 LAB — LIPASE, BLOOD: Lipase: 37 U/L (ref 11–51)

## 2020-01-09 LAB — OCCULT BLOOD X 1 CARD TO LAB, STOOL: Fecal Occult Bld: POSITIVE — AB

## 2020-01-09 MED ORDER — FENTANYL CITRATE (PF) 100 MCG/2ML IJ SOLN
100.0000 ug | Freq: Once | INTRAMUSCULAR | Status: AC
  Administered 2020-01-09: 21:00:00 100 ug via INTRAVENOUS
  Filled 2020-01-09: qty 2

## 2020-01-09 MED ORDER — CIPROFLOXACIN IN D5W 400 MG/200ML IV SOLN
400.0000 mg | Freq: Once | INTRAVENOUS | Status: AC
Start: 2020-01-09 — End: 2020-01-10
  Administered 2020-01-09: 400 mg via INTRAVENOUS
  Filled 2020-01-09: qty 200

## 2020-01-09 MED ORDER — FENTANYL CITRATE (PF) 100 MCG/2ML IJ SOLN
100.0000 ug | Freq: Once | INTRAMUSCULAR | Status: AC
  Administered 2020-01-09: 23:00:00 100 ug via INTRAVENOUS
  Filled 2020-01-09: qty 2

## 2020-01-09 MED ORDER — IOHEXOL 300 MG/ML  SOLN
100.0000 mL | Freq: Once | INTRAMUSCULAR | Status: AC | PRN
  Administered 2020-01-09: 100 mL via INTRAVENOUS

## 2020-01-09 MED ORDER — ONDANSETRON HCL 4 MG/2ML IJ SOLN
4.0000 mg | Freq: Once | INTRAMUSCULAR | Status: AC
  Administered 2020-01-09: 21:00:00 4 mg via INTRAVENOUS
  Filled 2020-01-09: qty 2

## 2020-01-09 MED ORDER — SODIUM CHLORIDE 0.9 % IV SOLN: Freq: Once | INTRAVENOUS | Status: AC

## 2020-01-09 MED ORDER — PROMETHAZINE HCL 25 MG/ML IJ SOLN
12.5000 mg | Freq: Once | INTRAMUSCULAR | Status: AC
  Administered 2020-01-10: 12.5 mg via INTRAVENOUS

## 2020-01-09 MED ORDER — METRONIDAZOLE IN NACL 5-0.79 MG/ML-% IV SOLN
500.0000 mg | Freq: Once | INTRAVENOUS | Status: AC
Start: 2020-01-09 — End: 2020-01-10
  Administered 2020-01-09: 500 mg via INTRAVENOUS
  Filled 2020-01-09: qty 100

## 2020-01-09 MED ORDER — SODIUM CHLORIDE 0.9 % IV SOLN
INTRAVENOUS | Status: DC | PRN
  Administered 2020-01-09: 23:00:00 250 mL via INTRAVENOUS

## 2020-01-09 MED ORDER — PROMETHAZINE HCL 25 MG/ML IJ SOLN
INTRAMUSCULAR | Status: AC
  Filled 2020-01-09: qty 1

## 2020-01-09 NOTE — ED Notes (Signed)
Pt vomiting approx 217ml. Dr. Laverta Baltimore and primary RN aware.

## 2020-01-09 NOTE — ED Notes (Signed)
Pt keeping pants, shoes, personal cell phone and charger cord. All other belongings given to husband Truman Hayward.

## 2020-01-09 NOTE — ED Triage Notes (Signed)
Pt reports sudden onset abdominal pain this evening. She had a covid vaccine earlier today. She states she is on blood thinners and her stools are"dark" due to that. 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

## 2020-01-09 NOTE — ED Provider Notes (Signed)
Emergency Department Provider Note   I have reviewed the triage vital signs and the nursing notes.   HISTORY  Chief Complaint Abdominal Pain   HPI Nancy Tran is a 64 y.o. female with PMH of HTN, HLD, and cerebral aneurysm s/p repair on Brilinta presents to the emergency department with acute onset abdominal pain with nausea after eating this evening.  Patient did receive for COVID-19 vaccine today but did not have any immediate vaccine reaction.  She describes her pain is severe with no radiation of symptoms or other modifying factors.  She has noticed some darker bowel movements since being started on Brilinta after her recent aneurysm repair.  No black bowel movements or bright red blood.  No prior history of similar abdominal pain.  Denies any fevers or chills.  No chest pain or respiratory symptoms.  No URI symptoms. No HA, weakness/numbness, vision change, or syncope.   Past Medical History:  Diagnosis Date  . High cholesterol   . High cholesterol   . Hypertension   . Tubal ligation status     Patient Active Problem List   Diagnosis Date Noted  . Hypokalemia 01/10/2020  . Occult GI bleeding 01/10/2020  . AKI (acute kidney injury) ([place]) 01/10/2020  . Hypomagnesemia 01/10/2020  . Acute diverticulitis 01/09/2020  . Cerebral aneurysm 12/09/2019  . Family history of brain aneurysm 11/07/2019  . GAD (generalized anxiety disorder) 10/28/2019  . Tension-type headache, not intractable 10/28/2019  . Mild renal insufficiency 10/14/2019  . Leg swelling 07/20/2019  . Normochromic anemia 04/12/2019  . Gastritis determined by endoscopy 11/02/2018  . Cervical cancer screening 10/12/2018  . Neutropenia ([place]) 07/28/2018  . History of colonic polyps 06/24/2018  . Odynophagia 06/10/2018  . Thyroid nodule 06/10/2018  . Prediabetes 05/26/2015  . Osteoarthritis of knee 11/07/2014  . Essential hypertension 09/03/2014  . Hyperlipidemia LDL goal <130 10/26/2013  . Hidradenitis  06/10/2012  . Tubal ligation status     Past Surgical History:  Procedure Laterality Date  . COLONOSCOPY    . TUBAL LIGATION      Allergies Codeine, Darvocet [propoxyphene n-acetaminophen], Lisinopril, Morphine and related, Amoxicillin-pot clavulanate, and Penicillins  History reviewed. No pertinent family history.  Social History Social History   Tobacco Use  . Smoking status: Never Smoker  . Smokeless tobacco: Never Used  Substance Use Topics  . Alcohol use: Yes  . Drug use: No    Review of Systems  Constitutional: No fever/chills Eyes: No visual changes. ENT: No sore throat. Cardiovascular: Denies chest pain. Respiratory: Denies shortness of breath. Gastrointestinal: Right sided abdominal pain. Positive nausea, no vomiting.  No diarrhea.  No constipation. Positive dark BMs.  Genitourinary: Negative for dysuria. Musculoskeletal: Negative for back pain. Skin: Negative for rash. Neurological: Negative for headaches, focal weakness or numbness.  10-point ROS otherwise negative.  ____________________________________________   PHYSICAL EXAM:  VITAL SIGNS: ED Triage Vitals  Enc Vitals Group     BP 01/09/20 2038 (!) 152/81     Pulse Rate 01/09/20 2038 83     Resp 01/09/20 2038 18     Temp 01/09/20 2038 98.3 F (36.8 C)     Temp Source 01/09/20 2038 Oral     SpO2 01/09/20 2038 100 %     Weight 01/09/20 2044 189 lb (85.7 kg)     Height 01/09/20 2044 5\' 7"  (1.702 m)   Constitutional: Alert and oriented. Able to provide a full history but appears uncomfortable.  Eyes: Conjunctivae are normal.  Head: Atraumatic. Nose:  No congestion/rhinnorhea. Mouth/Throat: Mucous membranes are moist.   Neck: No stridor.   Cardiovascular: Normal rate, regular rhythm. Good peripheral circulation. Grossly normal heart sounds.   Respiratory: Normal respiratory effort.  No retractions. Lungs CTAB. Gastrointestinal: Soft and nontender. No distention.  Musculoskeletal: No lower  extremity tenderness nor edema. No gross deformities of extremities. Neurologic:  Normal speech and language. No gross focal neurologic deficits are appreciated.  Skin:  Skin is warm, dry and intact. No rash noted.  ____________________________________________   LABS (all labs ordered are listed, but only abnormal results are displayed)  Labs Reviewed  COMPREHENSIVE METABOLIC PANEL - Abnormal; Notable for the following components:      Result Value   Potassium 3.3 (*)    Glucose, Bld 107 (*)    BUN 24 (*)    Creatinine, Ser 1.21 (*)    GFR calc non Af Amer 47 (*)    GFR calc Af Amer 55 (*)    All other components within normal limits  CBC WITH DIFFERENTIAL/PLATELET - Abnormal; Notable for the following components:   RBC 3.36 (*)    Hemoglobin 9.2 (*)    HCT 28.5 (*)    All other components within normal limits  OCCULT BLOOD X 1 CARD TO LAB, STOOL - Abnormal; Notable for the following components:   Fecal Occult Bld POSITIVE (*)    All other components within normal limits  COMPREHENSIVE METABOLIC PANEL - Abnormal; Notable for the following components:   Glucose, Bld 117 (*)    Creatinine, Ser 1.01 (*)    GFR calc non Af Amer 59 (*)    All other components within normal limits  CBC WITH DIFFERENTIAL/PLATELET - Abnormal; Notable for the following components:   WBC 14.2 (*)    RBC 3.45 (*)    Hemoglobin 9.3 (*)    HCT 29.3 (*)    Neutro Abs 13.0 (*)    Lymphs Abs 0.4 (*)    All other components within normal limits  MAGNESIUM - Abnormal; Notable for the following components:   Magnesium 1.2 (*)    All other components within normal limits  SARS CORONAVIRUS 2 (TAT 6-24 HRS)  URINE CULTURE  LIPASE, BLOOD  URINALYSIS, ROUTINE W REFLEX MICROSCOPIC  HIV ANTIBODY (ROUTINE TESTING W REFLEX)  CBC WITH DIFFERENTIAL/PLATELET  BASIC METABOLIC PANEL  MAGNESIUM  TYPE AND SCREEN  ABO/RH   ____________________________________________  RADIOLOGY  CT ABDOMEN PELVIS W  CONTRAST  Result Date: 01/09/2020 CLINICAL DATA:  Sudden onset abdominal pain EXAM: CT ABDOMEN AND PELVIS WITH CONTRAST TECHNIQUE: Multidetector CT imaging of the abdomen and pelvis was performed using the standard protocol following bolus administration of intravenous contrast. CONTRAST:  171mL OMNIPAQUE IOHEXOL 300 MG/ML  SOLN COMPARISON:  CT abdomen pelvis 03/29/2013 (report only) FINDINGS: Lower chest: Lung bases are clear. Normal heart size. No pericardial effusion. Hepatobiliary: No focal liver abnormality is seen. No gallstones, gallbladder wall thickening, or biliary dilatation. Pancreas: Unremarkable. No pancreatic ductal dilatation or surrounding inflammatory changes. Spleen: Normal in size without focal abnormality. Adrenals/Urinary Tract: Adrenal glands are unremarkable. Kidneys are normal, without renal calculi, focal lesion, or hydronephrosis. Bladder is unremarkable. Stomach/Bowel: Distal esophagus, stomach and duodenal sweep are unremarkable. No small bowel wall thickening or dilatation. No evidence of obstruction. There is circumferential thickening of the ascending colon with more focal phlegmonous change which appears to be centered on several right-sided colonic diverticula (2/45, 5/33, 6/33). No extraluminal gas or fluid collection is seen. Minimal thickening at the appendiceal base with a  more normal appearing appendiceal tip favored to be reactive to the more focal colonic process. Distal colon is unremarkable. Vascular/Lymphatic: Atherosclerotic plaque within the normal caliber abdominal aorta and branch vessels. Few reactive lymph nodes in the right lower quadrant. No pathologically enlarged nodes in the abdomen or pelvis. Reproductive: Normal appearance of the uterus and adnexal structures. Other: No abdominopelvic free fluid or free gas. No bowel containing hernias. Musculoskeletal: Mild grade 1 anterolisthesis L4 on 5 without pars defects. Minimal discogenic and facet degenerative  change maximal L5-S1. No acute osseous abnormality or suspicious osseous lesion. IMPRESSION: 1. Circumferential thickening of the ascending colon with more focal phlegmonous change which appears to be centered on several right-sided colonic diverticula. No extraluminal gas or fluid collection is seen to suggest perforation. Findings are most suggestive of acute diverticulitis. 2. Minimal thickening at the appendiceal base with a more normal appearing appendiceal tip favored to be reactive to the above process. 3. Mild grade 1 anterolisthesis L4 on 5 without pars defects. 4. Aortic Atherosclerosis (ICD10-I70.0). Electronically Signed   By: Lovena Le M.D.   On: 01/09/2020 22:01    ____________________________________________   PROCEDURES  Procedure(s) performed:   Procedures  None ____________________________________________   INITIAL IMPRESSION / ASSESSMENT AND PLAN / ED COURSE  Pertinent labs & imaging results that were available during my care of the patient were reviewed by me and considered in my medical decision making (see chart for details).   Patient presents to the ED with acute abdominal pain and report of dark stools. Patient is on antiplatelet after recent aneurysm coiling at [facility]. No neuro symptoms. Abdomen tender on the right diffusely with no peritoneal findings. Labs reviewed. CT with consistent with acute diverticulitis. Appendix changes thought to be related to diverticulitis changes. Covering with abx.   No reassessment the patient continues to have moderate to severe pain and has developed some vomiting as well. Will admit. Starting IVF.   Discussed patient's case with [facility], Dr. Myna Hidalgo to request admission. Patient and family (if present) updated with plan. Care transferred to [facility] [place] service.  I reviewed all nursing notes, vitals, pertinent old records, EKGs, labs, imaging (as available).   ____________________________________________  FINAL CLINICAL IMPRESSION(S) / ED  DIAGNOSES  Final diagnoses:  Diverticulitis of large intestine without perforation or abscess with bleeding     MEDICATIONS GIVEN DURING THIS VISIT:  Medications  ondansetron (ZOFRAN) injection 4 mg (4 mg Intravenous Given 01/10/20 1303)  metroNIDAZOLE (FLAGYL) IVPB 500 mg (500 mg Intravenous New Bag/Given 01/10/20 1307)  ciprofloxacin (CIPRO) IVPB 400 mg (400 mg Intravenous New Bag/Given 01/10/20 0954)  acetaminophen (TYLENOL) tablet 650 mg (has no administration in time range)    Or  acetaminophen (TYLENOL) suppository 650 mg (has no administration in time range)  aspirin EC tablet 81 mg (81 mg Oral Given 01/10/20 1519)  pravastatin (PRAVACHOL) tablet 40 mg (40 mg Oral Given 01/10/20 1519)  ticagrelor (BRILINTA) tablet 90 mg (90 mg Oral Given 01/10/20 1519)  baclofen (LIORESAL) tablet 5-10 mg (has no administration in time range)  HYDROmorphone (DILAUDID) injection 1 mg (1 mg Intravenous Given 01/10/20 1518)  pantoprazole (PROTONIX) injection 40 mg (40 mg Intravenous Given 01/10/20 1519)  dextrose 5 % in lactated ringers infusion ( Intravenous New Bag/Given 01/10/20 1519)  ondansetron (ZOFRAN) injection 4 mg (4 mg Intravenous Given 01/09/20 2110)  fentaNYL (SUBLIMAZE) injection 100 mcg (100 mcg Intravenous Given 01/09/20 2112)  iohexol (OMNIPAQUE) 300 MG/ML solution 100 mL (100 mLs Intravenous Contrast Given 01/09/20 2138)  fentaNYL (SUBLIMAZE)  injection 100 mcg (100 mcg Intravenous Given 01/09/20 2246)  ciprofloxacin (CIPRO) IVPB 400 mg ( Intravenous Stopped 01/10/20 0000)  metroNIDAZOLE (FLAGYL) IVPB 500 mg ( Intravenous Stopped 01/10/20 0002)  0.9 %  sodium chloride infusion ( Intravenous Rate/Dose Verify 01/10/20 0007)  promethazine (PHENERGAN) injection 12.5 mg (12.5 mg Intravenous Given 01/10/20 0008)  potassium chloride SA (KLOR-CON) CR tablet 40 mEq (40 mEq Oral Given 01/10/20 1519)  magnesium sulfate IVPB 2 g 50 mL (2 g Intravenous New Bag/Given 01/10/20 1818)    Note:  This document was  prepared using Dragon voice recognition software and may include unintentional dictation errors.  Nanda Quinton, MD, [facility]    Dillyn Joaquin, [facility], MD 01/10/20 2001

## 2020-01-10 ENCOUNTER — Encounter (HOSPITAL_COMMUNITY): Payer: Self-pay | Admitting: Family Medicine

## 2020-01-10 DIAGNOSIS — N179 Acute kidney failure, unspecified: Secondary | ICD-10-CM

## 2020-01-10 DIAGNOSIS — E876 Hypokalemia: Secondary | ICD-10-CM | POA: Diagnosis present

## 2020-01-10 DIAGNOSIS — R195 Other fecal abnormalities: Secondary | ICD-10-CM | POA: Diagnosis present

## 2020-01-10 DIAGNOSIS — I671 Cerebral aneurysm, nonruptured: Secondary | ICD-10-CM

## 2020-01-10 LAB — CBC WITH DIFFERENTIAL/PLATELET
Abs Immature Granulocytes: 0.06 10*3/uL (ref 0.00–0.07)
Basophils Absolute: 0 10*3/uL (ref 0.0–0.1)
Basophils Relative: 0 %
Eosinophils Absolute: 0 10*3/uL (ref 0.0–0.5)
Eosinophils Relative: 0 %
HCT: 29.3 % — ABNORMAL LOW (ref 36.0–46.0)
Hemoglobin: 9.3 g/dL — ABNORMAL LOW (ref 12.0–15.0)
Immature Granulocytes: 0 %
Lymphocytes Relative: 3 %
Lymphs Abs: 0.4 10*3/uL — ABNORMAL LOW (ref 0.7–4.0)
MCH: 27 pg (ref 26.0–34.0)
MCHC: 31.7 g/dL (ref 30.0–36.0)
MCV: 84.9 fL (ref 80.0–100.0)
Monocytes Absolute: 0.8 10*3/uL (ref 0.1–1.0)
Monocytes Relative: 5 %
Neutro Abs: 13 10*3/uL — ABNORMAL HIGH (ref 1.7–7.7)
Neutrophils Relative %: 92 %
Platelets: 391 10*3/uL (ref 150–400)
RBC: 3.45 MIL/uL — ABNORMAL LOW (ref 3.87–5.11)
RDW: 14.8 % (ref 11.5–15.5)
WBC: 14.2 10*3/uL — ABNORMAL HIGH (ref 4.0–10.5)
nRBC: 0 % (ref 0.0–0.2)

## 2020-01-10 LAB — COMPREHENSIVE METABOLIC PANEL
ALT: 18 U/L (ref 0–44)
AST: 22 U/L (ref 15–41)
Albumin: 4 g/dL (ref 3.5–5.0)
Alkaline Phosphatase: 51 U/L (ref 38–126)
Anion gap: 13 (ref 5–15)
BUN: 17 mg/dL (ref 8–23)
CO2: 25 mmol/L (ref 22–32)
Calcium: 9.5 mg/dL (ref 8.9–10.3)
Chloride: 100 mmol/L (ref 98–111)
Creatinine, Ser: 1.01 mg/dL — ABNORMAL HIGH (ref 0.44–1.00)
GFR calc Af Amer: >60 mL/min (ref >60)
GFR calc non Af Amer: 59 mL/min — ABNORMAL LOW (ref >60)
Glucose, Bld: 117 mg/dL — ABNORMAL HIGH (ref 70–99)
Potassium: 3.5 mmol/L (ref 3.5–5.1)
Sodium: 138 mmol/L (ref 135–145)
Total Bilirubin: 0.6 mg/dL (ref 0.3–1.2)
Total Protein: 7 g/dL (ref 6.5–8.1)

## 2020-01-10 LAB — TYPE AND SCREEN
ABO/RH(D): O POS
Antibody Screen: NEGATIVE

## 2020-01-10 LAB — MAGNESIUM: Magnesium: 1.2 mg/dL — ABNORMAL LOW (ref 1.7–2.4)

## 2020-01-10 LAB — ABO/RH: ABO/RH(D): O POS

## 2020-01-10 LAB — HIV ANTIBODY (ROUTINE TESTING W REFLEX): HIV Screen 4th Generation wRfx: NONREACTIVE

## 2020-01-10 LAB — SARS CORONAVIRUS 2 (TAT 6-24 HRS): SARS Coronavirus 2: NEGATIVE

## 2020-01-10 MED ORDER — CIPROFLOXACIN IN D5W 400 MG/200ML IV SOLN
400.0000 mg | Freq: Two times a day (BID) | INTRAVENOUS | Status: DC
  Administered 2020-01-10 – 2020-01-16 (×13): 400 mg via INTRAVENOUS
  Filled 2020-01-10 (×14): qty 200

## 2020-01-10 MED ORDER — ACETAMINOPHEN 325 MG PO TABS
650.0000 mg | ORAL_TABLET | Freq: Four times a day (QID) | ORAL | Status: DC | PRN
  Administered 2020-01-16 (×2): 650 mg via ORAL
  Filled 2020-01-10 (×2): qty 2

## 2020-01-10 MED ORDER — ASPIRIN EC 81 MG PO TBEC
81.0000 mg | DELAYED_RELEASE_TABLET | Freq: Every day | ORAL | Status: DC
  Administered 2020-01-10 – 2020-01-18 (×9): 81 mg via ORAL
  Filled 2020-01-10 (×9): qty 1

## 2020-01-10 MED ORDER — HYDROMORPHONE HCL 1 MG/ML IJ SOLN
1.0000 mg | INTRAMUSCULAR | Status: DC | PRN
  Administered 2020-01-10 – 2020-01-15 (×16): 1 mg via INTRAVENOUS
  Filled 2020-01-10 (×17): qty 1

## 2020-01-10 MED ORDER — PANTOPRAZOLE SODIUM 40 MG PO TBEC: 40.0000 mg | DELAYED_RELEASE_TABLET | Freq: Every day | ORAL | Status: DC

## 2020-01-10 MED ORDER — POTASSIUM CHLORIDE CRYS ER 20 MEQ PO TBCR
40.0000 meq | EXTENDED_RELEASE_TABLET | Freq: Once | ORAL | Status: AC
  Administered 2020-01-10: 40 meq via ORAL
  Filled 2020-01-10: qty 2

## 2020-01-10 MED ORDER — TICAGRELOR 90 MG PO TABS
90.0000 mg | ORAL_TABLET | Freq: Two times a day (BID) | ORAL | Status: DC
  Administered 2020-01-10 – 2020-01-18 (×17): 90 mg via ORAL
  Filled 2020-01-10 (×17): qty 1

## 2020-01-10 MED ORDER — ONDANSETRON HCL 4 MG/2ML IJ SOLN
4.0000 mg | Freq: Four times a day (QID) | INTRAMUSCULAR | Status: DC | PRN
  Administered 2020-01-10 – 2020-01-15 (×7): 4 mg via INTRAVENOUS
  Filled 2020-01-10 (×8): qty 2

## 2020-01-10 MED ORDER — PANTOPRAZOLE SODIUM 40 MG IV SOLR
40.0000 mg | INTRAVENOUS | Status: DC
  Administered 2020-01-10 – 2020-01-11 (×2): 40 mg via INTRAVENOUS
  Filled 2020-01-10 (×2): qty 40

## 2020-01-10 MED ORDER — METRONIDAZOLE IN NACL 5-0.79 MG/ML-% IV SOLN
500.0000 mg | Freq: Three times a day (TID) | INTRAVENOUS | Status: DC
  Administered 2020-01-10 – 2020-01-16 (×20): 500 mg via INTRAVENOUS
  Filled 2020-01-10 (×20): qty 100

## 2020-01-10 MED ORDER — FENTANYL CITRATE (PF) 100 MCG/2ML IJ SOLN
25.0000 ug | INTRAMUSCULAR | Status: DC | PRN
  Administered 2020-01-10 (×3): 50 ug via INTRAVENOUS
  Filled 2020-01-10 (×3): qty 2

## 2020-01-10 MED ORDER — ACETAMINOPHEN 650 MG RE SUPP: 650.0000 mg | Freq: Four times a day (QID) | RECTAL | Status: DC | PRN

## 2020-01-10 MED ORDER — MAGNESIUM SULFATE 2 GM/50ML IV SOLN
2.0000 g | Freq: Once | INTRAVENOUS | Status: AC
  Administered 2020-01-10: 2 g via INTRAVENOUS
  Filled 2020-01-10: qty 50

## 2020-01-10 MED ORDER — BACLOFEN 10 MG PO TABS: 5.0000 mg | ORAL_TABLET | Freq: Three times a day (TID) | ORAL | Status: DC | PRN

## 2020-01-10 MED ORDER — PRAVASTATIN SODIUM 40 MG PO TABS
40.0000 mg | ORAL_TABLET | Freq: Every day | ORAL | Status: DC
  Administered 2020-01-10 – 2020-01-18 (×9): 40 mg via ORAL
  Filled 2020-01-10 (×10): qty 1

## 2020-01-10 MED ORDER — POTASSIUM CHLORIDE IN NACL 20-0.9 MEQ/L-% IV SOLN
INTRAVENOUS | Status: DC
  Filled 2020-01-10: qty 1000

## 2020-01-10 MED ORDER — DEXTROSE IN LACTATED RINGERS 5 % IV SOLN: INTRAVENOUS | Status: DC

## 2020-01-10 MED ORDER — PANTOPRAZOLE SODIUM 40 MG IV SOLR
40.0000 mg | INTRAVENOUS | Status: DC
  Administered 2020-01-10: 02:00:00 40 mg via INTRAVENOUS
  Filled 2020-01-10: qty 40

## 2020-01-10 NOTE — H&P (Signed)
History and Physical    Nancy Tran [id number] DOB: 29-May-1956 DOA: 01/09/2020  PCP: Lilian Coma., MD   Patient coming from: Home   Chief Complaint: Abdominal pain, nausea   HPI: Nancy Tran is a 64 y.o. female with medical history significant for hypertension, hyperlipidemia, and recent embolization of unruptured left ICA aneurysm, now presenting to the emergency department with acute abdominal pain.  The patient reports 2 to 3 days of mild abdominal discomfort, and then developed severe pain on 01/09/2020, described as generalized and associated with nausea and nonbloody vomiting.  She had noted some dark stools recently, but no melena or hematochezia.  Dark stools began after she started aspirin and Brilinta approximately 3 weeks ago leading up to her ICA embolization on 12/29/2019.  She denies any fevers, chills, hematemesis, chest pain, shortness of breath, or cough.  She has never experienced this previously.  [facility] ED Course: Upon arrival to the ED, patient is found to be afebrile, saturating well on room air, and with stable blood pressure.  Chemistry panel notable for potassium 3.3 and creatinine 1.21, up from 0.89 earlier this month.  CBC notable for a hemoglobin of 9.2, up from 8.6 earlier this month.  Patient was treated with IV fluids, ciprofloxacin, Flagyl, fentanyl, and COVID-19 PCR screening test is in process.  Review of Systems:  All other systems reviewed and apart from HPI, are negative.  Past Medical History:  Diagnosis Date  . High cholesterol   . High cholesterol   . Hypertension   . Tubal ligation status     Past Surgical History:  Procedure Laterality Date  . COLONOSCOPY    . TUBAL LIGATION       reports that she has never smoked. She has never used smokeless tobacco. She reports current alcohol use. She reports that she does not use drugs.  Allergies  Allergen Reactions  . Codeine   . Darvocet [Propoxyphene N-Acetaminophen]   . Lisinopril  Other (See Comments)  . Morphine And Related Nausea And Vomiting  . Amoxicillin-Pot Clavulanate Rash    Other reaction(s): RASH Other reaction(s): RASH   . Penicillins Rash    History reviewed. No pertinent family history.   Prior to Admission medications   Medication Sig Start Date End Date Taking? Authorizing Provider  Atorvastatin Calcium (LIPITOR PO) Take by mouth.    [provider]  ibuprofen (ADVIL,MOTRIN) 600 MG tablet Take 1 tablet (600 mg total) every 6 (six) hours as needed by mouth. 09/15/17   Cardama, Grayce Sessions, MD  loperamide (IMODIUM A-D) 2 MG tablet Take 1 tablet (2 mg total) by mouth 4 (four) times daily as needed for diarrhea or loose stools. 03/28/13   Fredia Sorrow, MD  LOSARTAN POTASSIUM PO Take by mouth.    [provider]  lovastatin (MEVACOR) 20 MG tablet Take 20 mg by mouth at bedtime.    [provider]  LOVASTATIN PO Take by mouth.    [provider]  olmesartan (BENICAR) 20 MG tablet Take 20 mg by mouth daily.    [provider]  ondansetron (ZOFRAN ODT) 4 MG disintegrating tablet Take 1 tablet (4 mg total) by mouth every 8 (eight) hours as needed for nausea. 03/29/13   Fredia Sorrow, MD  potassium chloride SA (K-DUR,KLOR-CON) 20 MEQ tablet Take 1 tablet (20 mEq total) by mouth 2 (two) times daily. 03/29/13   Fredia Sorrow, MD  PRAVASTATIN SODIUM PO Take by mouth.    [provider]  promethazine (PHENERGAN) 25  MG tablet Take 1 tablet (25 mg total) by mouth every 6 (six) hours as needed for nausea. 03/29/13   Fredia Sorrow, MD    Physical Exam: Vitals:   01/09/20 2256 01/10/20 0009 01/10/20 0036 01/10/20 0125  BP: (!) 142/72 140/70 132/71 130/70  Pulse: 86 95 83 79  Resp: 11 13 (!) 24 20  Temp:    97.9 F (36.6 C)  TempSrc:    Oral  SpO2: 100% 100% 100% 100%  Weight:      Height:         Constitutional: NAD, calm  Eyes: PERTLA, lids and conjunctivae normal ENMT: Mucous membranes are  moist. Posterior pharynx clear of any exudate or lesions.   Neck: normal, supple, no masses, no thyromegaly Respiratory:  no wheezing, no crackles. No accessory muscle use.  Cardiovascular: S1 & S2 heard, regular rate and rhythm. No extremity edema. No significant JVD. Abdomen: No distension, soft, tender in mid-abdomen without rebound pain or guarding. Bowel sounds active.  Musculoskeletal: no clubbing / cyanosis. No joint deformity upper and lower extremities.   Skin: no significant rashes, lesions, ulcers. Warm, dry, well-perfused. Neurologic: No facial asymmetry. Sensation intact. Moving all extremities.  Psychiatric: Alert and oriented, appropriate throughout interview and exam. Pleasant and cooperative.    Labs and Imaging on Admission: I have personally reviewed following labs and imaging studies  CBC: Recent Labs  Lab 01/09/20 2100  WBC 8.1  NEUTROABS 5.6  HGB 9.2*  HCT 28.5*  MCV 84.8  PLT [id number]   Basic Metabolic Panel: Recent Labs  Lab 01/09/20 2100  NA 137  K 3.3*  CL 102  CO2 24  GLUCOSE 107*  BUN 24*  CREATININE 1.21*  CALCIUM 9.7   GFR: Estimated Creatinine Clearance: 52.8 mL/min (A) (by C-G formula based on SCr of 1.21 mg/dL (H)). Liver Function Tests: Recent Labs  Lab 01/09/20 2100  AST 19  ALT 17  ALKPHOS 55  BILITOT 0.3  PROT 7.2  ALBUMIN 4.2   Recent Labs  Lab 01/09/20 2100  LIPASE 37   No results for input(s): AMMONIA in the last 168 hours. Coagulation Profile: No results for input(s): INR, PROTIME in the last 168 hours. Cardiac Enzymes: No results for input(s): CKTOTAL, CKMB, CKMBINDEX, TROPONINI in the last 168 hours. BNP (last 3 results) No results for input(s): PROBNP in the last 8760 hours. HbA1C: No results for input(s): HGBA1C in the last 72 hours. CBG: No results for input(s): GLUCAP in the last 168 hours. Lipid Profile: No results for input(s): CHOL, HDL, LDLCALC, TRIG, CHOLHDL, LDLDIRECT in the last 72 hours. Thyroid  Function Tests: No results for input(s): TSH, T4TOTAL, FREET4, T3FREE, THYROIDAB in the last 72 hours. Anemia Panel: No results for input(s): VITAMINB12, FOLATE, FERRITIN, TIBC, IRON, RETICCTPCT in the last 72 hours. Urine analysis:    Component Value Date/Time   COLORURINE YELLOW 01/09/2020 2245   APPEARANCEUR CLEAR 01/09/2020 2245   LABSPEC 1.020 01/09/2020 2245   PHURINE 5.5 01/09/2020 2245   GLUCOSEU NEGATIVE 01/09/2020 2245   HGBUR NEGATIVE 01/09/2020 2245   BILIRUBINUR NEGATIVE 01/09/2020 2245   KETONESUR NEGATIVE 01/09/2020 2245   PROTEINUR NEGATIVE 01/09/2020 2245   UROBILINOGEN 0.2 03/28/2013 1921   NITRITE NEGATIVE 01/09/2020 2245   LEUKOCYTESUR NEGATIVE 01/09/2020 2245   Sepsis Labs: @LABRCNTIP (procalcitonin:4,lacticidven:4) )No results found for this or any previous visit (from the past 240 hour(s)).   Radiological Exams on Admission: CT ABDOMEN PELVIS W CONTRAST  Result Date: 01/09/2020 CLINICAL DATA:  Sudden onset abdominal  pain EXAM: CT ABDOMEN AND PELVIS WITH CONTRAST TECHNIQUE: Multidetector CT imaging of the abdomen and pelvis was performed using the standard protocol following bolus administration of intravenous contrast. CONTRAST:  134mL OMNIPAQUE IOHEXOL 300 MG/ML  SOLN COMPARISON:  CT abdomen pelvis 03/29/2013 (report only) FINDINGS: Lower chest: Lung bases are clear. Normal heart size. No pericardial effusion. Hepatobiliary: No focal liver abnormality is seen. No gallstones, gallbladder wall thickening, or biliary dilatation. Pancreas: Unremarkable. No pancreatic ductal dilatation or surrounding inflammatory changes. Spleen: Normal in size without focal abnormality. Adrenals/Urinary Tract: Adrenal glands are unremarkable. Kidneys are normal, without renal calculi, focal lesion, or hydronephrosis. Bladder is unremarkable. Stomach/Bowel: Distal esophagus, stomach and duodenal sweep are unremarkable. No small bowel wall thickening or dilatation. No evidence of  obstruction. There is circumferential thickening of the ascending colon with more focal phlegmonous change which appears to be centered on several right-sided colonic diverticula (2/45, 5/33, 6/33). No extraluminal gas or fluid collection is seen. Minimal thickening at the appendiceal base with a more normal appearing appendiceal tip favored to be reactive to the more focal colonic process. Distal colon is unremarkable. Vascular/Lymphatic: Atherosclerotic plaque within the normal caliber abdominal aorta and branch vessels. Few reactive lymph nodes in the right lower quadrant. No pathologically enlarged nodes in the abdomen or pelvis. Reproductive: Normal appearance of the uterus and adnexal structures. Other: No abdominopelvic free fluid or free gas. No bowel containing hernias. Musculoskeletal: Mild grade 1 anterolisthesis L4 on 5 without pars defects. Minimal discogenic and facet degenerative change maximal L5-S1. No acute osseous abnormality or suspicious osseous lesion. IMPRESSION: 1. Circumferential thickening of the ascending colon with more focal phlegmonous change which appears to be centered on several right-sided colonic diverticula. No extraluminal gas or fluid collection is seen to suggest perforation. Findings are most suggestive of acute diverticulitis. 2. Minimal thickening at the appendiceal base with a more normal appearing appendiceal tip favored to be reactive to the above process. 3. Mild grade 1 anterolisthesis L4 on 5 without pars defects. 4. Aortic Atherosclerosis (ICD10-I70.0). Electronically Signed   By: Lovena Le M.D.   On: 01/09/2020 22:01    Assessment/Plan   1. Acute diverticulitis  - Presents with acute abdominal pain and nausea after 2-3 days of mild abdominal discomfort, and found to have likely acute diverticulitis on CT  - She reports penicillin allergy and was started on ciprofloxacin and Flagyl in ED  - Continue current antibiotics, pain-control, IVF hydration, bowel  rest, and consider repeat imaging if not significantly improved in 2-3 days    2. Occult GI bleeding  - Hgb is 9.2 in ED, up from 8.6 earlier this month  - She reports recent dark stools after starting ASA and Brilinta prior to embolization of unruptured left ICA aneurysm 2 wks ago, no melena or gross blood on DRE per ED physician but was FOBT positive  - Type and screen, monitor blood counts, start daily PPI   3. Mild renal insufficiency  - SCr is 1.2 in ED< up from 0.9 earlier this month  - Hold losartan, continue IVF hydration, repeat chem panel in am    4. Hypokalemia  - KCl added to IVF, repeat chem panel in am   5. Hypertension  - BP at goal, hold losartan in light of increased creatinine and treat only as needed for now     DVT prophylaxis: SCDs Code Status: Full  Family Communication: Discussed with patient   Disposition Plan: Likely home in 3 days with oral antibiotics if  significantly improved, otherwise may need repeat imaging +/- surgical or IR consultation  Consults called: None  Admission status: inpatient    Vianne Bulls, MD [facility] Pager: See [email or web address]  If 7AM-7PM, please contact the daytime attending [email or web address]  01/10/2020, 2:08 AM

## 2020-01-10 NOTE — Progress Notes (Signed)
PROGRESS NOTE    Nancy Tran  [id number] DOB: 16-Jan-1956 DOA: 01/09/2020 PCP: Lilian Coma., MD    Brief Narrative:  Patient was admitted to the hospital with a working diagnosis of acute diverticulitis, complicated by acute kidney injury.   64 year old female who presented with abdominal pain and nausea.  She does have significant past medical history for hypertension, this lipidemia, left intracerebral aneurysm status post embolization.  Patient reported 2 to 3 days of mild abdominal discomfort, that progressed into severe pain on the day of admission.  No fevers no chills.  On her initial physical examination her blood pressure was 142/72, heart rate 95, respiratory rate 24, temperature 97.9, oxygen saturation 100%, her lungs had no wheezing or rhonchi, heart S1-S2 present and rhythmic, abdomen soft, positive tender in the mid abdomen without rebound or guarding, no lower extremity edema. Sodium 137, potassium 3.3, chloride 102, bicarb 24, glucose 107, BUN 24, creatinine 1.21, AST 19, ALT 17, total bilirubin 0.8, white count 8.1, hemoglobin 9.2, hematocrit 28.5, platelets 378.  SARS COVID-19 pending.  Urinalysis negative for infection.  CT of the abdomen and pelvis with circumferential thickening of the ascending colon, with more focal phlegmonous change on the right sided colonic diverticula, consistent with acute diverticulitis.  Patient was started on intravenous fluids, bowel rest and broad spectrum antibiotic therapy.   Assessment & Plan:   Principal Problem:   Acute diverticulitis Active Problems:   Cerebral aneurysm   Essential hypertension   Normochromic anemia   Hypokalemia   AKI (acute kidney injury) ([place])   Hypomagnesemia   1. Acute right sided diverticulitis. Patient continue to have significant pain and nausea, worse with movement. Wbc is now up to 14.2. Stable Hgb at 9,3 with Hct at 29,3. Positive hemoccult blood.   Will continue IV antibiotic therapy with  ciprofloxacin and metronidazole, continue hydration with IV fluids (d5 LR at 75 ml per H), change fentanyl to hydromorphone and will allow to have clear liquids. No clinical signs of significant gi bleeding, will continue close cell count monitoring.   Patient continue to be at high risk for worsening diverticulitis.   2. AKI with hypokalemia and hypomagnesemia. Patient clinically dry, will continue hydration with balanced electrolyte solution and dextrose at 75 ml per H, will continue K correction with KCl and MG, target K of 4 and Mg at 2. Renal function today with serum cr at 1,01.   3. HTN. Continue with IV fluids, will continue blood pressure monitoring, hold on antihypertensive medications.  4. Cerebral aneurysm/ dyslipidemia. Will resume antiplatelet therapy and statin.    DVT prophylaxis: enoxaparin   Code Status:  full Family Communication: I spoke with patient's husband at the bedside, we talked in detail about patient's condition, plan of care and prognosis and all questions were addressed.  Disposition Plan/ discharge barriers: patient from home, barrier for dc acutely ill with diverticulitis, requiring IV antibiotic therapy.    Antimicrobials:   Ciprofloxacin   Metronidazole     Subjective: Patient continue to have significant right lower abdominal pain, worse with movement, sharp in natures, associated with nausea, mild improvement with IV analgesics. No chest pain or dyspnea.   Objective: Vitals:   01/10/20 0009 01/10/20 0036 01/10/20 0125 01/10/20 0609  BP: 140/70 132/71 130/70 (!) 120/58  Pulse: 95 83 79 87  Resp: 13 (!) 24 20 16   Temp:   97.9 F (36.6 C) (!) 100.5 F (38.1 C)  TempSrc:   Oral Oral  SpO2: 100% 100% 100%  99%  Weight:      Height:        Intake/Output Summary (Last 24 hours) at 01/10/2020 1153 Last data filed at 01/10/2020 0007 Gross per 24 hour  Intake 463.81 ml  Output --  Net 463.81 ml   Filed Weights   01/09/20 2044  Weight: 85.7  kg    Examination:   General: Not in pain or dyspnea. Deconditioned  Neurology: Awake and alert, non focal  E ENT: no pallor, no icterus, oral mucosa moist Cardiovascular: No JVD. S1-S2 present, rhythmic, no gallops, rubs, or murmurs. No lower extremity edema. Pulmonary: positive breath sounds bilaterally, adequate air movement, no wheezing, rhonchi or rales. Gastrointestinal. Abdomen with no organomegaly, distended, tender to palpation at the right side, superficial palpation, mild guarding and rebound.  Skin. No rashes Musculoskeletal: no joint deformities     Data Reviewed: I have personally reviewed following labs and imaging studies  CBC: Recent Labs  Lab 01/09/20 2100 01/10/20 0308  WBC 8.1 14.2*  NEUTROABS 5.6 13.0*  HGB 9.2* 9.3*  HCT 28.5* 29.3*  MCV 84.8 84.9  PLT 378 0000000   Basic Metabolic Panel: Recent Labs  Lab 01/09/20 2100 01/10/20 0308  NA 137 138  K 3.3* 3.5  CL 102 100  CO2 24 25  GLUCOSE 107* 117*  BUN 24* 17  CREATININE 1.21* 1.01*  CALCIUM 9.7 9.5  MG  --  1.2*   GFR: Estimated Creatinine Clearance: 63.2 mL/min (A) (by C-G formula based on SCr of 1.01 mg/dL (H)). Liver Function Tests: Recent Labs  Lab 01/09/20 2100 01/10/20 0308  AST 19 22  ALT 17 18  ALKPHOS 55 51  BILITOT 0.3 0.6  PROT 7.2 7.0  ALBUMIN 4.2 4.0   Recent Labs  Lab 01/09/20 2100  LIPASE 37   No results for input(s): AMMONIA in the last 168 hours. Coagulation Profile: No results for input(s): INR, PROTIME in the last 168 hours. Cardiac Enzymes: No results for input(s): CKTOTAL, CKMB, CKMBINDEX, TROPONINI in the last 168 hours. BNP (last 3 results) No results for input(s): PROBNP in the last 8760 hours. HbA1C: No results for input(s): HGBA1C in the last 72 hours. CBG: No results for input(s): GLUCAP in the last 168 hours. Lipid Profile: No results for input(s): CHOL, HDL, LDLCALC, TRIG, CHOLHDL, LDLDIRECT in the last 72 hours. Thyroid Function Tests: No  results for input(s): TSH, T4TOTAL, FREET4, T3FREE, THYROIDAB in the last 72 hours. Anemia Panel: No results for input(s): VITAMINB12, FOLATE, FERRITIN, TIBC, IRON, RETICCTPCT in the last 72 hours.    Radiology Studies: I have reviewed all of the imaging during this hospital visit personally     Scheduled Meds: . pantoprazole (PROTONIX) IV  40 mg Intravenous Q24H   Continuous Infusions: . 0.9 % NaCl with KCl 20 mEq / L 100 mL/hr at 01/10/20 0156  . ciprofloxacin 400 mg (01/10/20 0954)  . metronidazole 500 mg (01/10/20 0606)     LOS: 1 day        Triton Heidrich Gerome Apley, MD

## 2020-01-10 NOTE — ED Notes (Signed)
[facility] notified (Tammy) - patient ready for transport 

## 2020-01-10 NOTE — Progress Notes (Addendum)
Received pt to rm [id number] via [facility] from [place], alert and oriented, tolerated transport well.

## 2020-01-11 ENCOUNTER — Encounter (HOSPITAL_COMMUNITY): Payer: Self-pay | Admitting: Family Medicine

## 2020-01-11 LAB — CBC WITH DIFFERENTIAL/PLATELET
Abs Immature Granulocytes: 0.12 10*3/uL — ABNORMAL HIGH (ref 0.00–0.07)
Basophils Absolute: 0 10*3/uL (ref 0.0–0.1)
Basophils Relative: 0 %
Eosinophils Absolute: 0 10*3/uL (ref 0.0–0.5)
Eosinophils Relative: 0 %
HCT: 24.7 % — ABNORMAL LOW (ref 36.0–46.0)
Hemoglobin: 8.1 g/dL — ABNORMAL LOW (ref 12.0–15.0)
Immature Granulocytes: 1 %
Lymphocytes Relative: 5 %
Lymphs Abs: 0.8 10*3/uL (ref 0.7–4.0)
MCH: 27 pg (ref 26.0–34.0)
MCHC: 32.8 g/dL (ref 30.0–36.0)
MCV: 82.3 fL (ref 80.0–100.0)
Monocytes Absolute: 1.1 10*3/uL — ABNORMAL HIGH (ref 0.1–1.0)
Monocytes Relative: 8 %
Neutro Abs: 12.9 10*3/uL — ABNORMAL HIGH (ref 1.7–7.7)
Neutrophils Relative %: 86 %
Platelets: 294 10*3/uL (ref 150–400)
RBC: 3 MIL/uL — ABNORMAL LOW (ref 3.87–5.11)
RDW: 14.6 % (ref 11.5–15.5)
WBC: 14.9 10*3/uL — ABNORMAL HIGH (ref 4.0–10.5)
nRBC: 0 % (ref 0.0–0.2)

## 2020-01-11 LAB — BASIC METABOLIC PANEL
Anion gap: 10 (ref 5–15)
BUN: 8 mg/dL (ref 8–23)
CO2: 23 mmol/L (ref 22–32)
Calcium: 8.9 mg/dL (ref 8.9–10.3)
Chloride: 103 mmol/L (ref 98–111)
Creatinine, Ser: 0.99 mg/dL (ref 0.44–1.00)
GFR calc Af Amer: >60 mL/min (ref >60)
GFR calc non Af Amer: >60 mL/min (ref >60)
Glucose, Bld: 119 mg/dL — ABNORMAL HIGH (ref 70–99)
Potassium: 4.6 mmol/L (ref 3.5–5.1)
Sodium: 136 mmol/L (ref 135–145)

## 2020-01-11 LAB — URINE CULTURE: Culture: NO GROWTH

## 2020-01-11 LAB — MAGNESIUM: Magnesium: 1.8 mg/dL (ref 1.7–2.4)

## 2020-01-11 NOTE — Progress Notes (Signed)
PROGRESS NOTE    Nancy Tran  [id number] DOB: 11-May-1956 DOA: 01/09/2020 PCP: Lilian Coma., MD    Brief Narrative:  Patient was admitted to the hospital with a working diagnosis of acute diverticulitis, complicated by acute kidney injury.   64 year old female who presented with abdominal pain and nausea.  She does have significant past medical history for hypertension, this lipidemia, left intracerebral aneurysm status post embolization.  Patient reported 2 to 3 days of mild abdominal discomfort, that progressed into severe pain on the day of admission.  No fevers no chills.  On her initial physical examination her blood pressure was 142/72, heart rate 95, respiratory rate 24, temperature 97.9, oxygen saturation 100%, her lungs had no wheezing or rhonchi, heart S1-S2 present and rhythmic, abdomen soft, positive tender in the mid abdomen without rebound or guarding, no lower extremity edema. Sodium 137, potassium 3.3, chloride 102, bicarb 24, glucose 107, BUN 24, creatinine 1.21, AST 19, ALT 17, total bilirubin 0.8, white count 8.1, hemoglobin 9.2, hematocrit 28.5, platelets 378.  SARS COVID-19 pending.  Urinalysis negative for infection.  CT of the abdomen and pelvis with circumferential thickening of the ascending colon, with more focal phlegmonous change on the right sided colonic diverticula, consistent with acute diverticulitis.  Patient was started on intravenous fluids, bowel rest and broad spectrum antibiotic therapy.    Assessment & Plan:   Principal Problem:   Acute diverticulitis Active Problems:   Cerebral aneurysm   Essential hypertension   Normochromic anemia   Hypokalemia   AKI (acute kidney injury) ([place])   Hypomagnesemia    1. Acute right sided diverticulitis. Persistent leukocytosis at 14.9, with persistent abdominal pain, nausea and vomiting.  Will continue broad spectrum antibiotic therapy with IV ciprofloxacin and metronidazole, IV fluids with d5 LR  at 75 ml per H and continue pain control with hydromorphone, antiacids and as needed antiemetics. Will keep patient on clear liquid diet for now.  Patient continue to be at high risk for worsening diverticulitis.   2. AKI with hypokalemia and hypomagnesemia. K has improved to 4,6 with stable renal function with serum cr at 0,99 with bicarbonate at 23. Mg continue to be low at 1,8. Will add 2 gm Mg sulfate IV and follow renal function in am.   3. HTN. Blood pressure today 125 mmHg, will continue to hold on antihypertensive medications, for now.  4. Cerebral aneurysm/ dyslipidemia. Continue ticagrelor/ asa and statin therapy.   DVT prophylaxis: enoxaparin   Code Status:  full Family Communication: I spoke with patient's husband at the bedside, we talked in detail about patient's condition, plan of care and prognosis and all questions were addressed.  Disposition Plan/ discharge barriers: patient from home, barrier for dc acutely ill with diverticulitis, requiring IV antibiotic therapy.    Antimicrobials:   Ciprofloxacin   Metronidazole      Subjective: Patient continue to have significant pain at the right sided abdomen, worse with movement, associated with nausea and vomiting, no chest pain or dyspnea. No melena or hematochezia.   Objective: Vitals:   01/10/20 0609 01/10/20 0815 01/10/20 2200 01/11/20 0408  BP: (!) 120/58 (!) 107/59 (!) 110/56 110/63  Pulse: 87 82 82 84  Resp: 16  16 16   Temp: (!) 100.5 F (38.1 C)  98.8 F (37.1 C) 98.7 F (37.1 C)  TempSrc: Oral  Oral Oral  SpO2: 99% 100% 100% 100%  Weight:      Height:        Intake/Output Summary (Last  24 hours) at 01/11/2020 1003 Last data filed at 01/11/2020 0532 Gross per 24 hour  Intake 2487.39 ml  Output --  Net 2487.39 ml   Filed Weights   01/09/20 2044  Weight: 85.7 kg    Examination:   General: Not in pain or dyspnea, deconditioned Neurology: Awake and alert, non focal  E ENT: no pallor, no  icterus, oral mucosa moist Cardiovascular: No JVD. S1-S2 present, rhythmic, no gallops, rubs, or murmurs. No lower extremity edema. Pulmonary: positive breath sounds bilaterally, adequate air movement, no wheezing, rhonchi or rales. Gastrointestinal. Abdomen mild distention. Positive tenderness to superficial palpation diffusely more on the right side. Positive guarding, unable to assess rebound. No organomegaly.  Skin. No rashes Musculoskeletal: no joint deformities     Data Reviewed: I have personally reviewed following labs and imaging studies  CBC: Recent Labs  Lab 01/09/20 2100 01/10/20 0308 01/11/20 0243  WBC 8.1 14.2* 14.9*  NEUTROABS 5.6 13.0* 12.9*  HGB 9.2* 9.3* 8.1*  HCT 28.5* 29.3* 24.7*  MCV 84.8 84.9 82.3  PLT 378 391 [id number]   Basic Metabolic Panel: Recent Labs  Lab 01/09/20 2100 01/10/20 0308 01/11/20 0243  NA 137 138 136  K 3.3* 3.5 4.6  CL 102 100 103  CO2 24 25 23   GLUCOSE 107* 117* 119*  BUN 24* 17 8  CREATININE 1.21* 1.01* 0.99  CALCIUM 9.7 9.5 8.9  MG  --  1.2* 1.8   GFR: Estimated Creatinine Clearance: 64.5 mL/min (by C-G formula based on SCr of 0.99 mg/dL). Liver Function Tests: Recent Labs  Lab 01/09/20 2100 01/10/20 0308  AST 19 22  ALT 17 18  ALKPHOS 55 51  BILITOT 0.3 0.6  PROT 7.2 7.0  ALBUMIN 4.2 4.0   Recent Labs  Lab 01/09/20 2100  LIPASE 37   No results for input(s): AMMONIA in the last 168 hours. Coagulation Profile: No results for input(s): INR, PROTIME in the last 168 hours. Cardiac Enzymes: No results for input(s): CKTOTAL, CKMB, CKMBINDEX, TROPONINI in the last 168 hours. BNP (last 3 results) No results for input(s): PROBNP in the last 8760 hours. HbA1C: No results for input(s): HGBA1C in the last 72 hours. CBG: No results for input(s): GLUCAP in the last 168 hours. Lipid Profile: No results for input(s): CHOL, HDL, LDLCALC, TRIG, CHOLHDL, LDLDIRECT in the last 72 hours. Thyroid Function Tests: No results for  input(s): TSH, T4TOTAL, FREET4, T3FREE, THYROIDAB in the last 72 hours. Anemia Panel: No results for input(s): VITAMINB12, FOLATE, FERRITIN, TIBC, IRON, RETICCTPCT in the last 72 hours.    Radiology Studies: I have reviewed all of the imaging during this hospital visit personally     Scheduled Meds: . aspirin EC  81 mg Oral Daily  . pantoprazole (PROTONIX) IV  40 mg Intravenous Q24H  . pravastatin  40 mg Oral Daily  . ticagrelor  90 mg Oral BID   Continuous Infusions: . ciprofloxacin 400 mg (01/11/20 0940)  . dextrose 5% lactated ringers 75 mL/hr at 01/11/20 0843  . metronidazole 500 mg (01/11/20 0532)     LOS: 2 days        Lavere Stork Gerome Apley, MD

## 2020-01-11 NOTE — Plan of Care (Signed)
  Problem: Education: Goal: Knowledge of General Education information will improve Description: Including pain rating scale, medication(s)/side effects and non-pharmacologic comfort measures Outcome: Progressing   Problem: Activity: Goal: Risk for activity intolerance will decrease Outcome: Progressing   

## 2020-01-11 NOTE — Progress Notes (Signed)
1530: Arrived to room to restart IV. Pt. Has IVF infusing in her R AC currently. She said "It is better now. My nurse redressed it and its fine so I dont need to be restuck right now." Instructed pt. To let her RN know if it started hurting or wouldn't infuse and IVT could come back. She said "OK".

## 2020-01-12 LAB — CBC WITH DIFFERENTIAL/PLATELET
Abs Immature Granulocytes: 0.04 10*3/uL (ref 0.00–0.07)
Basophils Absolute: 0 10*3/uL (ref 0.0–0.1)
Basophils Relative: 0 %
Eosinophils Absolute: 0.2 10*3/uL (ref 0.0–0.5)
Eosinophils Relative: 2 %
HCT: 23.5 % — ABNORMAL LOW (ref 36.0–46.0)
Hemoglobin: 7.6 g/dL — ABNORMAL LOW (ref 12.0–15.0)
Immature Granulocytes: 0 %
Lymphocytes Relative: 10 %
Lymphs Abs: 1 10*3/uL (ref 0.7–4.0)
MCH: 27.3 pg (ref 26.0–34.0)
MCHC: 32.3 g/dL (ref 30.0–36.0)
MCV: 84.5 fL (ref 80.0–100.0)
Monocytes Absolute: 1 10*3/uL (ref 0.1–1.0)
Monocytes Relative: 9 %
Neutro Abs: 8.4 10*3/uL — ABNORMAL HIGH (ref 1.7–7.7)
Neutrophils Relative %: 79 %
Platelets: 313 10*3/uL (ref 150–400)
RBC: 2.78 MIL/uL — ABNORMAL LOW (ref 3.87–5.11)
RDW: 14.6 % (ref 11.5–15.5)
WBC: 10.7 10*3/uL — ABNORMAL HIGH (ref 4.0–10.5)
nRBC: 0 % (ref 0.0–0.2)

## 2020-01-12 LAB — BASIC METABOLIC PANEL
Anion gap: 11 (ref 5–15)
BUN: <5 mg/dL — ABNORMAL LOW (ref 8–23)
CO2: 24 mmol/L (ref 22–32)
Calcium: 8.9 mg/dL (ref 8.9–10.3)
Chloride: 105 mmol/L (ref 98–111)
Creatinine, Ser: 0.94 mg/dL (ref 0.44–1.00)
GFR calc Af Amer: >60 mL/min (ref >60)
GFR calc non Af Amer: >60 mL/min (ref >60)
Glucose, Bld: 99 mg/dL (ref 70–99)
Potassium: 3.5 mmol/L (ref 3.5–5.1)
Sodium: 140 mmol/L (ref 135–145)

## 2020-01-12 MED ORDER — METOCLOPRAMIDE HCL 5 MG/ML IJ SOLN
5.0000 mg | Freq: Three times a day (TID) | INTRAMUSCULAR | Status: DC
  Administered 2020-01-12 – 2020-01-17 (×13): 5 mg via INTRAVENOUS
  Filled 2020-01-12 (×16): qty 2

## 2020-01-12 MED ORDER — POTASSIUM CHLORIDE CRYS ER 20 MEQ PO TBCR
40.0000 meq | EXTENDED_RELEASE_TABLET | Freq: Once | ORAL | Status: AC
  Administered 2020-01-12: 40 meq via ORAL
  Filled 2020-01-12: qty 2

## 2020-01-12 MED ORDER — PANTOPRAZOLE SODIUM 40 MG PO TBEC
40.0000 mg | DELAYED_RELEASE_TABLET | Freq: Two times a day (BID) | ORAL | Status: DC
  Administered 2020-01-12 – 2020-01-18 (×12): 40 mg via ORAL
  Filled 2020-01-12 (×12): qty 1

## 2020-01-12 MED ORDER — WHITE PETROLATUM EX OINT
TOPICAL_OINTMENT | CUTANEOUS | Status: AC
  Filled 2020-01-12: qty 28.35

## 2020-01-12 MED ORDER — PANTOPRAZOLE SODIUM 40 MG PO TBEC
40.0000 mg | DELAYED_RELEASE_TABLET | Freq: Every day | ORAL | Status: DC
  Administered 2020-01-12: 40 mg via ORAL
  Filled 2020-01-12: qty 1

## 2020-01-12 NOTE — Progress Notes (Signed)
Per patient, not ready for PIV start, she wants to take a shower first, waiting for husband to bring her soap. RN made aware.

## 2020-01-12 NOTE — Progress Notes (Signed)
PROGRESS NOTE    Nancy Tran  [id number] DOB: 1956/01/02 DOA: 01/09/2020 PCP: Lilian Coma., MD    Brief Narrative:  Patient was admitted to the hospital with a working diagnosis of acute diverticulitis, complicated by acute kidney injury.  64 year old female who presented with abdominal pain and nausea. She does have significant past medical history for hypertension, this lipidemia, left intracerebral aneurysm status post embolization. Patient reported 2 to 3 days of mild abdominal discomfort, that progressed into severe pain on the day of admission. No fevers no chills. On her initial physical examination her blood pressure was 142/72, heart rate 95, respiratory rate 24, temperature 97.9, oxygen saturation 100%,her lungs had no wheezing or rhonchi, heart S1-S2 present and rhythmic, abdomen soft, positive tender in the mid abdomen without rebound or guarding, no lower extremity edema. Sodium 137, potassium 3.3, chloride 102, bicarb 24, glucose 107, BUN 24, creatinine 1.21, AST 19, ALT 17, total bilirubin 0.8, white count 8.1, hemoglobin 9.2, hematocrit 28.5, platelets 378.SARS COVID-19 pending.Urinalysis negative for infection. CT of the abdomen and pelvis with circumferential thickening of the ascending colon, with more focal phlegmonous change on the right sided colonic diverticula, consistent with acute diverticulitis.  Patient was started on intravenous fluids, bowel rest and broad spectrum antibiotic therapy.  Abdominal pain slowly improving, Hgb noted to be low and complains of dark stools.   Assessment & Plan:   Principal Problem:   Acute diverticulitis Active Problems:   Cerebral aneurysm   Essential hypertension   Normochromic anemia   Hypokalemia   AKI (acute kidney injury) ([place])   Hypomagnesemia   1. Acute right sided diverticulitis/ lower GI bleeding/ acute blood loss anemia. Wbc is down to 10,7, her abdominal pain has improved but not yet back to  baseline, she continue to have nausea and vomiting.  Tolerating well antibiotic therapy with IV ciprofloxacin and metronidazole. Will decreased fluids with d5 LR at 50 ml per H, pain control with hydromorphone, continue with antiacids and as needed antiemetics. Advance diet to soft and will schedule metoclopramide tid with meals.   Reported black stools and serum Hgb down to 7,6, will continue close follow up of cell count, prbc transfusion if Hgb less than 7. Risk vs benefit, will continue with dual antiplatelet therapy for now.   2. AKI with hypokalemia and hypomagnesemia. K today is 3,5, with serum cr at 0,94 with bicarbonate at 24. Mg. Will continue k correction with po kcl 40 meq and will follow renal panel in am, plus Mg.    3. HTN. Continue to hold on antihypertensive medications, for now.  4. Cerebral aneurysm/ dyslipidemia. On ticagrelor/ asa and statin therapy. Risk vs benefit will continue antiplatelet therapy for now.    DVT prophylaxis:enoxaparin Code Status:full Family Communication:no family at the bedside  Disposition Plan/ discharge barriers:patient from home, barrier for dc acutely ill with diverticulitis, requiring IV antibiotic therapy.   Antimicrobials:  Ciprofloxacin   Metronidazole     Subjective: Patient continue to have right lower quadrant abdominal pain, mild improvement for yesterday but not yet back to baseline, is worse with movement and touch. Positive nausea and vomiting, dark stools and positive headache.   Objective: Vitals:   01/11/20 0408 01/11/20 1401 01/11/20 2111 01/12/20 0404  BP: 110/63 125/60 118/60 123/66  Pulse: 84 91 88 89  Resp: 16 17 18 20   Temp: 98.7 F (37.1 C) 99.6 F (37.6 C) 98.6 F (37 C) 98.1 F (36.7 C)  TempSrc: Oral Oral Oral Oral  SpO2:  100%  99% 100%  Weight:      Height:        Intake/Output Summary (Last 24 hours) at 01/12/2020 1123 Last data filed at 01/12/2020 0404 Gross per 24 hour    Intake 1943.95 ml  Output --  Net 1943.95 ml   Filed Weights   01/09/20 2044  Weight: 85.7 kg    Examination:   General: Not in pain or dyspnea, deconditioned and in pain/ abdominal Neurology: Awake and alert, non focal  E ENT: mild pallor, no icterus, oral mucosa moist Cardiovascular: No JVD. S1-S2 present, rhythmic, no gallops, rubs, or murmurs. No lower extremity edema. Pulmonary: positive breath sounds bilaterally, adequate air movement, no wheezing, rhonchi or rales. Gastrointestinal. Abdomen mild distended, tender to palpation at the right side, superficial palpation, with no organomegaly. Skin. No rashes Musculoskeletal: no joint deformities     Data Reviewed: I have personally reviewed following labs and imaging studies  CBC: Recent Labs  Lab 01/09/20 2100 01/10/20 0308 01/11/20 0243 01/12/20 0215  WBC 8.1 14.2* 14.9* 10.7*  NEUTROABS 5.6 13.0* 12.9* 8.4*  HGB 9.2* 9.3* 8.1* 7.6*  HCT 28.5* 29.3* 24.7* 23.5*  MCV 84.8 84.9 82.3 84.5  PLT 378 391 294 [id number]   Basic Metabolic Panel: Recent Labs  Lab 01/09/20 2100 01/10/20 0308 01/11/20 0243 01/12/20 0215  NA 137 138 136 140  K 3.3* 3.5 4.6 3.5  CL 102 100 103 105  CO2 24 25 23 24   GLUCOSE 107* 117* 119* 99  BUN 24* 17 8 <5*  CREATININE 1.21* 1.01* 0.99 0.94  CALCIUM 9.7 9.5 8.9 8.9  MG  --  1.2* 1.8  --    GFR: Estimated Creatinine Clearance: 68 mL/min (by C-G formula based on SCr of 0.94 mg/dL). Liver Function Tests: Recent Labs  Lab 01/09/20 2100 01/10/20 0308  AST 19 22  ALT 17 18  ALKPHOS 55 51  BILITOT 0.3 0.6  PROT 7.2 7.0  ALBUMIN 4.2 4.0   Recent Labs  Lab 01/09/20 2100  LIPASE 37   No results for input(s): AMMONIA in the last 168 hours. Coagulation Profile: No results for input(s): INR, PROTIME in the last 168 hours. Cardiac Enzymes: No results for input(s): CKTOTAL, CKMB, CKMBINDEX, TROPONINI in the last 168 hours. BNP (last 3 results) No results for input(s): PROBNP in  the last 8760 hours. HbA1C: No results for input(s): HGBA1C in the last 72 hours. CBG: No results for input(s): GLUCAP in the last 168 hours. Lipid Profile: No results for input(s): CHOL, HDL, LDLCALC, TRIG, CHOLHDL, LDLDIRECT in the last 72 hours. Thyroid Function Tests: No results for input(s): TSH, T4TOTAL, FREET4, T3FREE, THYROIDAB in the last 72 hours. Anemia Panel: No results for input(s): VITAMINB12, FOLATE, FERRITIN, TIBC, IRON, RETICCTPCT in the last 72 hours.    Radiology Studies: I have reviewed all of the imaging during this hospital visit personally     Scheduled Meds: . aspirin EC  81 mg Oral Daily  . pantoprazole (PROTONIX) IV  40 mg Intravenous Q24H  . pravastatin  40 mg Oral Daily  . ticagrelor  90 mg Oral BID   Continuous Infusions: . ciprofloxacin 400 mg (01/12/20 0847)  . dextrose 5% lactated ringers 75 mL/hr at 01/12/20 0341  . metronidazole 500 mg (01/12/20 0513)     LOS: 3 days        Gabriella Woodhead Gerome Apley, MD

## 2020-01-13 LAB — CBC WITH DIFFERENTIAL/PLATELET
Abs Immature Granulocytes: 0.03 10*3/uL (ref 0.00–0.07)
Basophils Absolute: 0.1 10*3/uL (ref 0.0–0.1)
Basophils Relative: 1 %
Eosinophils Absolute: 0.2 10*3/uL (ref 0.0–0.5)
Eosinophils Relative: 3 %
HCT: 26.9 % — ABNORMAL LOW (ref 36.0–46.0)
Hemoglobin: 8.4 g/dL — ABNORMAL LOW (ref 12.0–15.0)
Immature Granulocytes: 1 %
Lymphocytes Relative: 18 %
Lymphs Abs: 1.2 10*3/uL (ref 0.7–4.0)
MCH: 26.8 pg (ref 26.0–34.0)
MCHC: 31.2 g/dL (ref 30.0–36.0)
MCV: 85.9 fL (ref 80.0–100.0)
Monocytes Absolute: 0.8 10*3/uL (ref 0.1–1.0)
Monocytes Relative: 12 %
Neutro Abs: 4.3 10*3/uL (ref 1.7–7.7)
Neutrophils Relative %: 65 %
Platelets: 369 10*3/uL (ref 150–400)
RBC: 3.13 MIL/uL — ABNORMAL LOW (ref 3.87–5.11)
RDW: 14.5 % (ref 11.5–15.5)
WBC: 6.6 10*3/uL (ref 4.0–10.5)
nRBC: 0 % (ref 0.0–0.2)

## 2020-01-13 LAB — BASIC METABOLIC PANEL
Anion gap: 12 (ref 5–15)
BUN: 5 mg/dL — ABNORMAL LOW (ref 8–23)
CO2: 25 mmol/L (ref 22–32)
Calcium: 9.1 mg/dL (ref 8.9–10.3)
Chloride: 103 mmol/L (ref 98–111)
Creatinine, Ser: 1.05 mg/dL — ABNORMAL HIGH (ref 0.44–1.00)
GFR calc Af Amer: >60 mL/min (ref >60)
GFR calc non Af Amer: 56 mL/min — ABNORMAL LOW (ref >60)
Glucose, Bld: 106 mg/dL — ABNORMAL HIGH (ref 70–99)
Potassium: 3.9 mmol/L (ref 3.5–5.1)
Sodium: 140 mmol/L (ref 135–145)

## 2020-01-13 LAB — MAGNESIUM: Magnesium: 1.4 mg/dL — ABNORMAL LOW (ref 1.7–2.4)

## 2020-01-13 MED ORDER — MAGNESIUM SULFATE 2 GM/50ML IV SOLN
2.0000 g | Freq: Once | INTRAVENOUS | Status: AC
  Administered 2020-01-13: 2 g via INTRAVENOUS
  Filled 2020-01-13: qty 50

## 2020-01-13 MED ORDER — LORATADINE 10 MG PO TABS
10.0000 mg | ORAL_TABLET | Freq: Every day | ORAL | Status: DC
  Administered 2020-01-13 – 2020-01-18 (×6): 10 mg via ORAL
  Filled 2020-01-13 (×6): qty 1

## 2020-01-13 NOTE — Progress Notes (Signed)
PROGRESS NOTE    Penellope Kilson  [id number] DOB: October 01, 1956 DOA: 01/09/2020 PCP: Lilian Coma., MD    Brief Narrative:  Patient was admitted to the hospital with a working diagnosis of acute diverticulitis, complicated by acute kidney injury.  64 year old female who presented with abdominal pain and nausea. She does have significant past medical history for hypertension, dyslipidemia, left intracerebral aneurysm status post embolization. Patient reported 2 to 3 days of mild abdominal discomfort, that progressed into severe pain on the day of admission. No fevers no chills. On her initial physical examination her blood pressure was 142/72, heart rate 95, respiratory rate 24, temperature 97.9, oxygen saturation 100%,her lungs had no wheezing or rhonchi, heart S1-S2 present and rhythmic, abdomen soft, positive tender in the mid abdomen without rebound or guarding, no lower extremity edema. Sodium 137, potassium 3.3, chloride 102, bicarb 24, glucose 107, BUN 24, creatinine 1.21, AST 19, ALT 17, total bilirubin 0.8, white count 8.1, hemoglobin 9.2, hematocrit 28.5, platelets 378.SARS COVID-19 negative.Urinalysis negative for infection. CT of the abdomen and pelvis with circumferential thickening of the ascending colon, with more focal phlegmonous change on the right sided colonic diverticula, consistent with acute diverticulitis.  Patient was started on intravenous fluids, bowel rest and broad spectrum antibiotic therapy.  Abdominal pain slowly improving, Hgb noted to be low and complains of dark stools, risk vs benefit patient has been continue on ticagrelor with good toleration.    Assessment & Plan:   Principal Problem:   Acute diverticulitis Active Problems:   Cerebral aneurysm   Essential hypertension   Normochromic anemia   Hypokalemia   AKI (acute kidney injury) ([place])   Hypomagnesemia    1. Acute right sided diverticulitis/ lower GI bleeding/ acute blood  loss anemia. Wbc continue trending down today at 6,6 slowly improving her abdominal pain, but not yet back to baseline, no further dark stools.   Continue antibiotic therapy withIV ciprofloxacin and metronidazole. Continue pain control withhydromorphone. On antiacids and tid metoclopramide for nausea control.    Hgb up to 8,4 with no signs of recurrent bleeding, will continue close monitoring, continue with ticagrelor.   Continue IV fluids at 50 ml per H with D5LR.   2. AKI with hypokalemia and hypomagnesemia.K today is 3,9, with serum cr at 1,0 with bicarbonate at 25. Mg down to 1,4. Will correct Mg with 2 gm Mag sulfate and will follow electrolytes in am. Continue dextrose with balance electrolyte solutions at 50 ml per H.  .  3. HTN.Systolic blood pressure 99991111 to 115 mmHg, will continue to hold on antihypertensive medications, to prevent hypotension.   4. Cerebral aneurysm/ dyslipidemia.Continue with ticagrelor/ asa and statin therapy. I spoke with neurosurgery over the phone, patient needs to stay on antiplatelet therapy.   DVT prophylaxis:enoxaparin Code Status:full Family Communication:no family at the bedside  Disposition Plan/ discharge barriers:patient from home, barrier for dc acutely ill with diverticulitis, requiring IV antibiotic therapy and IV analgesics, possible dc in 48 to 72 H   Antimicrobials:   Ciprofloxacin and metronidazole     Subjective: Patient continue to have abdominal pain but has slowly improving, better nausea and no chest pain. No further black stools. Has been out of bed to chair with good toleration.   Objective: Vitals:   01/12/20 0404 01/12/20 1525 01/12/20 2241 01/13/20 0601  BP: 123/66 (!) 122/56 101/60 115/64  Pulse: 89 83 86 78  Resp: 20 17 18 18   Temp: 98.1 F (36.7 C) 98.7 F (37.1 C) 98.7 F (  37.1 C) 98.3 F (36.8 C)  TempSrc: Oral Oral Oral Oral  SpO2: 100% 100% 100% 100%  Weight:      Height:         Intake/Output Summary (Last 24 hours) at 01/13/2020 0903 Last data filed at 01/13/2020 0631 Gross per 24 hour  Intake 1956.02 ml  Output 200 ml  Net 1756.02 ml   Filed Weights   01/09/20 2044  Weight: 85.7 kg    Examination:   General: deconditioned  Neurology: Awake and alert, non focal  E ENT: no pallor, no icterus, oral mucosa moist Cardiovascular: No JVD. S1-S2 present, rhythmic, no gallops, rubs, or murmurs. No lower extremity edema. Pulmonary: positive breath sounds bilaterally, adequate air movement, no wheezing, rhonchi or rales. Gastrointestinal. Abdomen with no organomegaly, no rebound or guarding. Positive tenderness to superficial palpation at the right side. Positive bowel sounds.  Skin. No rashes Musculoskeletal: no joint deformities     Data Reviewed: I have personally reviewed following labs and imaging studies  CBC: Recent Labs  Lab 01/09/20 2100 01/10/20 0308 01/11/20 0243 01/12/20 0215 01/13/20 0307  WBC 8.1 14.2* 14.9* 10.7* 6.6  NEUTROABS 5.6 13.0* 12.9* 8.4* 4.3  HGB 9.2* 9.3* 8.1* 7.6* 8.4*  HCT 28.5* 29.3* 24.7* 23.5* 26.9*  MCV 84.8 84.9 82.3 84.5 85.9  PLT 378 391 294 313 0000000   Basic Metabolic Panel: Recent Labs  Lab 01/09/20 2100 01/10/20 0308 01/11/20 0243 01/12/20 0215 01/13/20 0307  NA 137 138 136 140 140  K 3.3* 3.5 4.6 3.5 3.9  CL 102 100 103 105 103  CO2 24 25 23 24 25   GLUCOSE 107* 117* 119* 99 106*  BUN 24* 17 8 <5* 5*  CREATININE 1.21* 1.01* 0.99 0.94 1.05*  CALCIUM 9.7 9.5 8.9 8.9 9.1  MG  --  1.2* 1.8  --  1.4*   GFR: Estimated Creatinine Clearance: 60.8 mL/min (A) (by C-G formula based on SCr of 1.05 mg/dL (H)). Liver Function Tests: Recent Labs  Lab 01/09/20 2100 01/10/20 0308  AST 19 22  ALT 17 18  ALKPHOS 55 51  BILITOT 0.3 0.6  PROT 7.2 7.0  ALBUMIN 4.2 4.0   Recent Labs  Lab 01/09/20 2100  LIPASE 37   No results for input(s): AMMONIA in the last 168 hours. Coagulation Profile: No results  for input(s): INR, PROTIME in the last 168 hours. Cardiac Enzymes: No results for input(s): CKTOTAL, CKMB, CKMBINDEX, TROPONINI in the last 168 hours. BNP (last 3 results) No results for input(s): PROBNP in the last 8760 hours. HbA1C: No results for input(s): HGBA1C in the last 72 hours. CBG: No results for input(s): GLUCAP in the last 168 hours. Lipid Profile: No results for input(s): CHOL, HDL, LDLCALC, TRIG, CHOLHDL, LDLDIRECT in the last 72 hours. Thyroid Function Tests: No results for input(s): TSH, T4TOTAL, FREET4, T3FREE, THYROIDAB in the last 72 hours. Anemia Panel: No results for input(s): VITAMINB12, FOLATE, FERRITIN, TIBC, IRON, RETICCTPCT in the last 72 hours.    Radiology Studies: I have reviewed all of the imaging during this hospital visit personally     Scheduled Meds: . aspirin EC  81 mg Oral Daily  . metoCLOPramide (REGLAN) injection  5 mg Intravenous TID AC  . pantoprazole  40 mg Oral BID  . pravastatin  40 mg Oral Daily  . ticagrelor  90 mg Oral BID   Continuous Infusions: . ciprofloxacin Stopped (01/13/20 0646)  . dextrose 5% lactated ringers 50 mL/hr at 01/12/20 1842  . metronidazole 500  mg (01/13/20 [id number])     LOS: 4 days        Bertha Lokken Gerome Apley, MD

## 2020-01-14 DIAGNOSIS — F411 Generalized anxiety disorder: Secondary | ICD-10-CM

## 2020-01-14 DIAGNOSIS — R7303 Prediabetes: Secondary | ICD-10-CM

## 2020-01-14 DIAGNOSIS — K5733 Diverticulitis of large intestine without perforation or abscess with bleeding: Principal | ICD-10-CM

## 2020-01-14 DIAGNOSIS — E785 Hyperlipidemia, unspecified: Secondary | ICD-10-CM

## 2020-01-14 LAB — PHOSPHORUS: Phosphorus: 3.6 mg/dL (ref 2.5–4.6)

## 2020-01-14 LAB — IRON AND TIBC
Iron: 33 ug/dL (ref 28–170)
Saturation Ratios: 10 % — ABNORMAL LOW (ref 10.4–31.8)
TIBC: 336 ug/dL (ref 250–450)
UIBC: 303 ug/dL

## 2020-01-14 LAB — BASIC METABOLIC PANEL
Anion gap: 10 (ref 5–15)
BUN: 5 mg/dL — ABNORMAL LOW (ref 8–23)
CO2: 25 mmol/L (ref 22–32)
Calcium: 9.1 mg/dL (ref 8.9–10.3)
Chloride: 105 mmol/L (ref 98–111)
Creatinine, Ser: 0.97 mg/dL (ref 0.44–1.00)
GFR calc Af Amer: >60 mL/min (ref >60)
GFR calc non Af Amer: >60 mL/min (ref >60)
Glucose, Bld: 105 mg/dL — ABNORMAL HIGH (ref 70–99)
Potassium: 4.4 mmol/L (ref 3.5–5.1)
Sodium: 140 mmol/L (ref 135–145)

## 2020-01-14 LAB — MAGNESIUM
Magnesium: 2 mg/dL (ref 1.7–2.4)
Magnesium: 1.6 mg/dL — ABNORMAL LOW (ref 1.7–2.4)

## 2020-01-14 LAB — CBC WITH DIFFERENTIAL/PLATELET
Abs Immature Granulocytes: 0.01 10*3/uL (ref 0.00–0.07)
Basophils Absolute: 0.1 10*3/uL (ref 0.0–0.1)
Basophils Relative: 1 %
Eosinophils Absolute: 0.3 10*3/uL (ref 0.0–0.5)
Eosinophils Relative: 5 %
HCT: 26.4 % — ABNORMAL LOW (ref 36.0–46.0)
Hemoglobin: 8.3 g/dL — ABNORMAL LOW (ref 12.0–15.0)
Immature Granulocytes: 0 %
Lymphocytes Relative: 27 %
Lymphs Abs: 1.4 10*3/uL (ref 0.7–4.0)
MCH: 26.9 pg (ref 26.0–34.0)
MCHC: 31.4 g/dL (ref 30.0–36.0)
MCV: 85.7 fL (ref 80.0–100.0)
Monocytes Absolute: 0.7 10*3/uL (ref 0.1–1.0)
Monocytes Relative: 13 %
Neutro Abs: 2.9 10*3/uL (ref 1.7–7.7)
Neutrophils Relative %: 54 %
Platelets: 427 10*3/uL — ABNORMAL HIGH (ref 150–400)
RBC: 3.08 MIL/uL — ABNORMAL LOW (ref 3.87–5.11)
RDW: 14.6 % (ref 11.5–15.5)
WBC: 5.3 10*3/uL (ref 4.0–10.5)
nRBC: 0 % (ref 0.0–0.2)

## 2020-01-14 LAB — RETICULOCYTES
Immature Retic Fract: 22.9 % — ABNORMAL HIGH (ref 2.3–15.9)
RBC.: 3.04 MIL/uL — ABNORMAL LOW (ref 3.87–5.11)
Retic Count, Absolute: 51.7 10*3/uL (ref 19.0–186.0)
Retic Ct Pct: 1.7 % (ref 0.4–3.1)

## 2020-01-14 LAB — VITAMIN B12: Vitamin B-12: 1757 pg/mL — ABNORMAL HIGH (ref 180–914)

## 2020-01-14 LAB — FOLATE: Folate: 29.8 ng/mL (ref >5.9)

## 2020-01-14 LAB — FERRITIN: Ferritin: 53 ng/mL (ref 11–307)

## 2020-01-14 MED ORDER — MAGNESIUM SULFATE 50 % IJ SOLN
3.0000 g | Freq: Once | INTRAVENOUS | Status: AC
  Administered 2020-01-14: 18:00:00 3 g via INTRAVENOUS
  Filled 2020-01-14: qty 6

## 2020-01-14 NOTE — Progress Notes (Signed)
PROGRESS NOTE    Nancy Tran  [id number] DOB: [birth year] DOA: 01/09/2020 PCP: Lilian Coma., MD     Brief Narrative:  64 year old female PMHx General Anxiety disorder, HTN, Dyslipidemia, Prediabetes LEFT intracerebral aneurysm status s/p Embolization.  GI bleed, Thyroid nodule, Hiradenitis,  who presented with abdominal pain and nausea. S Patient reported 2 to 3 days of mild abdominal discomfort, that progressed into severe pain on the day of admission. No fevers no chills. On her initial physical examination her blood pressure was 142/72, heart rate 95, respiratory rate 24, temperature 97.9, oxygen saturation 100%,her lungs had no wheezing or rhonchi, heart S1-S2 present and rhythmic, abdomen soft, positive tender in the mid abdomen without rebound or guarding, no lower extremity edema. Sodium 137, potassium 3.3, chloride 102, bicarb 24, glucose 107, BUN 24, creatinine 1.21, AST 19, ALT 17, total bilirubin 0.8, white count 8.1, hemoglobin 9.2, hematocrit 28.5, platelets 378.SARS COVID-19 negative.Urinalysis negative for infection. CT of the abdomen and pelvis with circumferential thickening of the ascending colon, with more focal phlegmonous change on the right sided colonic diverticula, consistent with acute diverticulitis.  Patient was started on intravenous fluids, bowel rest and broad spectrum antibiotic therapy.  Abdominal pain slowly improving, Hgb noted to be low and complains of dark stools, risk vs benefit patient has been continue on ticagrelor with good toleration.    Subjective: Post prandial N/V . Positive RLQ pain   Assessment & Plan:   Principal Problem:   Acute diverticulitis Active Problems:   Cerebral aneurysm   Essential hypertension   GAD (generalized anxiety disorder)   Hyperlipidemia LDL goal <130   Normochromic anemia   Prediabetes   Hypokalemia   Occult GI bleeding   AKI (acute kidney injury) ([place])   Hypomagnesemia   Acute  right sided diverticulitis/ lower GI bleeding/ acute blood loss anemia.Wbc continue trending down today at 6,6 slowly improving her abdominal pain, but not yet back to baseline, no further dark stools.   Continue antibiotic therapy withIV ciprofloxacin and metronidazole. Continue pain control withhydromorphone. Onantiacids and tid metoclopramide for nausea control.   Hgb up to 8,4 with no signs of recurrent bleeding, will continue close monitoring, continue with ticagrelor.  -D5 lactated Ringer's  50 ml/hr    -Anemia panel pending -Occult blood pending -Diet to full liquids Pt not tolerating soft  AKI (baseline Cr 0.90 taking 03/28/2013) Recent Labs  Lab 01/10/20 0308 01/11/20 0243 01/12/20 0215 01/13/20 0307 01/14/20 0216  CREATININE 1.01* 0.99 0.94 1.05* 0.97  -Back to baseline  Hypokalemia -Resolved  Hypomagnesmia -Magnesium goal> 2 -Magnesium IV 3 g  .  Essential HTN.Systolic blood pressure 99991111 to 115 mmHg, will continue to hold on antihypertensive medications, to prevent hypotension.   4. Cerebral aneurysm/ dyslipidemia.Continue withticagrelor/ asa and statin therapy. I spoke with neurosurgery over the phone, patient needs to stay on antiplatelet therapy.     DVT prophylaxis: SCD Code Status: Full Family Communication: 3/19 husband at bedside discussed plan of care answered all questions Disposition Plan: TBD 1.  Where the patient is from 2.  Anticipated d/c place. 3.  Barriers to d/c not tolerating p.o. intake   Consultants:    Procedures/Significant Events:    I have personally reviewed and interpreted all radiology studies and my findings are as above.  VENTILATOR SETTINGS:    Cultures   Antimicrobials: Anti-infectives (From admission, onward)   Start     Dose/Rate Stop   01/10/20 1000  ciprofloxacin (CIPRO) IVPB 400 mg  400 mg 200 mL/hr over 60 Minutes     01/10/20 0600  metroNIDAZOLE (FLAGYL) IVPB 500 mg     500 mg 100  mL/hr over 60 Minutes     01/09/20 2230  ciprofloxacin (CIPRO) IVPB 400 mg     400 mg 200 mL/hr over 60 Minutes 01/10/20 0000   01/09/20 2230  metroNIDAZOLE (FLAGYL) IVPB 500 mg     500 mg 100 mL/hr over 60 Minutes 01/10/20 0002       Devices    LINES / TUBES:      Continuous Infusions: . ciprofloxacin 400 mg (01/14/20 0939)  . dextrose 5% lactated ringers 50 mL/hr at 01/13/20 1323  . metronidazole 500 mg (01/14/20 0431)     Objective: Vitals:   01/13/20 0601 01/13/20 1628 01/13/20 2140 01/14/20 0434  BP: 115/64 107/68 119/68 (!) 145/76  Pulse: 78 76 82 84  Resp: 18 18 16 18   Temp: 98.3 F (36.8 C) 98.2 F (36.8 C) 98.8 F (37.1 C) 98.7 F (37.1 C)  TempSrc: Oral Oral Oral Oral  SpO2: 100% 100% 98% 100%  Weight:      Height:        Intake/Output Summary (Last 24 hours) at 01/14/2020 1131 Last data filed at 01/14/2020 1051 Gross per 24 hour  Intake 2355.15 ml  Output --  Net 2355.15 ml   Filed Weights   01/09/20 2044  Weight: 85.7 kg    Examination:  General: A/O x4, no acute respiratory distress Eyes: negative scleral hemorrhage, negative anisocoria, negative icterus ENT: Negative Runny nose, negative gingival bleeding, Neck:  Negative scars, masses, torticollis, lymphadenopathy, JVD Lungs: Clear to auscultation bilaterally without wheezes or crackles Cardiovascular: Regular rate and rhythm without murmur gallop or rub normal S1 and S2 Abdomen: POSITIVE abdominal pain RLQ> remainder of abdomen, nondistended, positive hypoactive, bowel sounds, no rebound, no ascites, no appreciable mass Extremities: No significant cyanosis, clubbing, or edema bilateral lower extremities Skin: Negative rashes, lesions, ulcers Psychiatric:  Negative depression, positive anxiety, negative fatigue, negative mania  Central nervous system:  Cranial nerves II through XII intact, tongue/uvula midline, all extremities muscle strength 5/5, sensation intact throughout,negative  dysarthria, negative expressive aphasia, negative receptive aphasia.  .     Data Reviewed: Care during the described time interval was provided by me .  I have reviewed this patient's available data, including medical history, events of note, physical examination, and all test results as part of my evaluation.  CBC: Recent Labs  Lab 01/10/20 0308 01/11/20 0243 01/12/20 0215 01/13/20 0307 01/14/20 0216  WBC 14.2* 14.9* 10.7* 6.6 5.3  NEUTROABS 13.0* 12.9* 8.4* 4.3 2.9  HGB 9.3* 8.1* 7.6* 8.4* 8.3*  HCT 29.3* 24.7* 23.5* 26.9* 26.4*  MCV 84.9 82.3 84.5 85.9 85.7  PLT 391 294 313 369 [id number]*   Basic Metabolic Panel: Recent Labs  Lab 01/10/20 0308 01/11/20 0243 01/12/20 0215 01/13/20 0307 01/14/20 0216  NA 138 136 140 140 140  K 3.5 4.6 3.5 3.9 4.4  CL 100 103 105 103 105  CO2 25 23 24 25 25   GLUCOSE 117* 119* 99 106* 105*  BUN 17 8 <5* 5* 5*  CREATININE 1.01* 0.99 0.94 1.05* 0.97  CALCIUM 9.5 8.9 8.9 9.1 9.1  MG 1.2* 1.8  --  1.4* 2.0   GFR: Estimated Creatinine Clearance: 65.9 mL/min (by C-G formula based on SCr of 0.97 mg/dL). Liver Function Tests: Recent Labs  Lab 01/09/20 2100 01/10/20 0308  AST 19 22  ALT 17 18  ALKPHOS  55 51  BILITOT 0.3 0.6  PROT 7.2 7.0  ALBUMIN 4.2 4.0   Recent Labs  Lab 01/09/20 2100  LIPASE 37   No results for input(s): AMMONIA in the last 168 hours. Coagulation Profile: No results for input(s): INR, PROTIME in the last 168 hours. Cardiac Enzymes: No results for input(s): CKTOTAL, CKMB, CKMBINDEX, TROPONINI in the last 168 hours. BNP (last 3 results) No results for input(s): PROBNP in the last 8760 hours. HbA1C: No results for input(s): HGBA1C in the last 72 hours. CBG: No results for input(s): GLUCAP in the last 168 hours. Lipid Profile: No results for input(s): CHOL, HDL, LDLCALC, TRIG, CHOLHDL, LDLDIRECT in the last 72 hours. Thyroid Function Tests: No results for input(s): TSH, T4TOTAL, FREET4, T3FREE, THYROIDAB in  the last 72 hours. Anemia Panel: No results for input(s): VITAMINB12, FOLATE, FERRITIN, TIBC, IRON, RETICCTPCT in the last 72 hours. Sepsis Labs: No results for input(s): PROCALCITON, LATICACIDVEN in the last 168 hours.  Recent Results (from the past 240 hour(s))  Urine culture     Status: None   Collection Time: 01/09/20 10:45 PM   Specimen: Urine, Clean Catch  Result Value Ref Range Status   Specimen Description   Final    URINE, CLEAN CATCH Performed at [facility], [address] [place], [place] [address]    Special Requests   Final    NONE Performed at [facility], [address] [place], [place] [address]    Culture   Final    NO GROWTH Performed at [facility], [place] [address] [place], [place] [address]    Report Status 01/11/2020 FINAL  Final  SARS CORONAVIRUS 2 (TAT 6-24 HRS) Nasopharyngeal Nasopharyngeal Swab     Status: None   Collection Time: 01/09/20 11:02 PM   Specimen: Nasopharyngeal Swab  Result Value Ref Range Status   SARS Coronavirus 2 NEGATIVE NEGATIVE Final    Comment: (NOTE) SARS-CoV-2 target nucleic acids are NOT DETECTED. The SARS-CoV-2 RNA is generally detectable in upper and lower respiratory specimens during the acute phase of infection. Negative results do not preclude SARS-CoV-2 infection, do not rule out co-infections with other pathogens, and should not be used as the sole basis for treatment or other patient management decisions. Negative results must be combined with clinical observations, patient history, and epidemiological information. The expected result is Negative. Fact Sheet for Patients: SugarRoll.be Fact Sheet for Healthcare Providers: https://www.-mathews.com/ This test is not yet approved or cleared by the [place] FDA and  has been authorized for detection and/or diagnosis of SARS-CoV-2 by FDA under an Emergency Use Authorization (EUA). This EUA  will remain  in effect (meaning this test can be used) for the duration of the COVID-19 declaration under Section 56 4(b)(1) of the Act, 21 U.S.C. section 360bbb-3(b)(1), unless the authorization is terminated or revoked sooner. Performed at [facility], [place] [address] [place], [place] [address]          Radiology Studies: No results found.      Scheduled Meds: . aspirin EC  81 mg Oral Daily  . loratadine  10 mg Oral Daily  . metoCLOPramide (REGLAN) injection  5 mg Intravenous TID AC  . pantoprazole  40 mg Oral BID  . pravastatin  40 mg Oral Daily  . ticagrelor  90 mg Oral BID   Continuous Infusions: . ciprofloxacin 400 mg (01/14/20 0939)  . dextrose 5% lactated ringers 50 mL/hr at 01/13/20 1323  .  metronidazole 500 mg (01/14/20 0431)     LOS: 5 days    Time spent:40 min    Geneal Huebert, Geraldo Docker, MD [facility] Pager [phone]  If 7PM-7AM, please contact night-coverage [email or web address] Password [facility] 01/14/2020, 11:31 AM

## 2020-01-15 LAB — COMPREHENSIVE METABOLIC PANEL
ALT: 12 U/L (ref 0–44)
AST: 18 U/L (ref 15–41)
Albumin: 2.9 g/dL — ABNORMAL LOW (ref 3.5–5.0)
Alkaline Phosphatase: 47 U/L (ref 38–126)
Anion gap: 10 (ref 5–15)
BUN: <5 mg/dL — ABNORMAL LOW (ref 8–23)
CO2: 26 mmol/L (ref 22–32)
Calcium: 8.7 mg/dL — ABNORMAL LOW (ref 8.9–10.3)
Chloride: 106 mmol/L (ref 98–111)
Creatinine, Ser: 0.9 mg/dL (ref 0.44–1.00)
GFR calc Af Amer: >60 mL/min (ref >60)
GFR calc non Af Amer: >60 mL/min (ref >60)
Glucose, Bld: 99 mg/dL (ref 70–99)
Potassium: 3.7 mmol/L (ref 3.5–5.1)
Sodium: 142 mmol/L (ref 135–145)
Total Bilirubin: 0.5 mg/dL (ref 0.3–1.2)
Total Protein: 5.6 g/dL — ABNORMAL LOW (ref 6.5–8.1)

## 2020-01-15 LAB — CBC
HCT: 24.7 % — ABNORMAL LOW (ref 36.0–46.0)
Hemoglobin: 7.7 g/dL — ABNORMAL LOW (ref 12.0–15.0)
MCH: 26.6 pg (ref 26.0–34.0)
MCHC: 31.2 g/dL (ref 30.0–36.0)
MCV: 85.2 fL (ref 80.0–100.0)
Platelets: 411 10*3/uL — ABNORMAL HIGH (ref 150–400)
RBC: 2.9 MIL/uL — ABNORMAL LOW (ref 3.87–5.11)
RDW: 14.6 % (ref 11.5–15.5)
WBC: 5 10*3/uL (ref 4.0–10.5)
nRBC: 0 % (ref 0.0–0.2)

## 2020-01-15 LAB — PHOSPHORUS: Phosphorus: 3.9 mg/dL (ref 2.5–4.6)

## 2020-01-15 LAB — MAGNESIUM: Magnesium: 2.1 mg/dL (ref 1.7–2.4)

## 2020-01-15 LAB — OCCULT BLOOD X 1 CARD TO LAB, STOOL
Fecal Occult Bld: NEGATIVE
Fecal Occult Bld: NEGATIVE

## 2020-01-15 NOTE — Plan of Care (Signed)

## 2020-01-15 NOTE — Progress Notes (Signed)
PROGRESS NOTE    Nancy Tran  [id number] DOB: Mar 14, 1956 DOA: 01/09/2020 PCP: Lilian Coma., MD     Brief Narrative:  64 year old BF PMHx General Anxiety disorder, HTN, Dyslipidemia, Prediabetes LEFT intracerebral aneurysm status s/p Embolization.  GI bleed, Thyroid nodule, Hiradenitis,  who presented with abdominal pain and nausea. S Patient reported 2 to 3 days of mild abdominal discomfort, that progressed into severe pain on the day of admission. No fevers no chills. On her initial physical examination her blood pressure was 142/72, heart rate 95, respiratory rate 24, temperature 97.9, oxygen saturation 100%,her lungs had no wheezing or rhonchi, heart S1-S2 present and rhythmic, abdomen soft, positive tender in the mid abdomen without rebound or guarding, no lower extremity edema. Sodium 137, potassium 3.3, chloride 102, bicarb 24, glucose 107, BUN 24, creatinine 1.21, AST 19, ALT 17, total bilirubin 0.8, white count 8.1, hemoglobin 9.2, hematocrit 28.5, platelets 378.SARS COVID-19 negative.Urinalysis negative for infection. CT of the abdomen and pelvis with circumferential thickening of the ascending colon, with more focal phlegmonous change on the right sided colonic diverticula, consistent with acute diverticulitis.  Patient was started on intravenous fluids, bowel rest and broad spectrum antibiotic therapy.  Abdominal pain slowly improving, Hgb noted to be low and complains of dark stools, risk vs benefit patient has been continue on ticagrelor with good toleration.    Subjective: 3/20 decrease abdominal pain, with full liquid diet.  Positive diarrhea but decreased.   Assessment & Plan:   Principal Problem:   Acute diverticulitis Active Problems:   Cerebral aneurysm   Essential hypertension   GAD (generalized anxiety disorder)   Hyperlipidemia LDL goal <130   Normochromic anemia   Prediabetes   Hypokalemia   Occult GI bleeding   AKI (acute kidney  injury) ([place])   Hypomagnesemia   Acute right sided diverticulitis/ lower GI bleeding/ Acute blood loss anemia. Recent Labs  Lab 01/11/20 0243 01/12/20 0215 01/13/20 0307 01/14/20 0216 01/15/20 0323  HGB 8.1* 7.6* 8.4* 8.3* 7.7*  -3/14 positive fecal occult blood -3/20 fecal occult blood negative -Continue IV antibiotics continue antibiotic therapy withIV ciprofloxacin and metronidazole. Continue pain control withhydromorphone. Onantiacids and tid metoclopramide for nausea control.  -Hemoglobin remains low but stable continue to monitor.   ticagrelor. -D5 lactated Ringer's  50 ml/hr  -Anemia panel pending -Continue full liquid diet, may advance to bland in the a.m. if she continues to tolerate.    AKI (baseline Cr 0.90 taking 03/28/2013) Recent Labs  Lab 01/11/20 0243 01/12/20 0215 01/13/20 0307 01/14/20 0216 01/15/20 0323  CREATININE 0.99 0.94 1.05* 0.97 0.90  -Back to baseline  Hypokalemia -Resolved  Hypomagnesmia -Magnesium goal> 2 -Magnesium IV 3 g .  Essential HTN. -BP controlled without antihypertensive continue to hold.   Cerebral Aneurysm/ dyslipidemia. -Brilinta 90 mg daily    DVT prophylaxis: SCD Code Status: Full Family Communication: 3/20 husband at bedside discussed plan of care answered all questions Disposition Plan: TBD 1.  Where the patient is from 2.  Anticipated d/c place. 3.  Barriers to d/c not tolerating p.o. intake   Consultants:    Procedures/Significant Events:    I have personally reviewed and interpreted all radiology studies and my findings are as above.  VENTILATOR SETTINGS:    Cultures   Antimicrobials: Anti-infectives (From admission, onward)   Start     Dose/Rate Stop   01/10/20 1000  ciprofloxacin (CIPRO) IVPB 400 mg     400 mg 200 mL/hr over 60 Minutes     01/10/20  0600  metroNIDAZOLE (FLAGYL) IVPB 500 mg     500 mg 100 mL/hr over 60 Minutes     01/09/20 2230  ciprofloxacin (CIPRO) IVPB 400 mg      400 mg 200 mL/hr over 60 Minutes 01/10/20 0000   01/09/20 2230  metroNIDAZOLE (FLAGYL) IVPB 500 mg     500 mg 100 mL/hr over 60 Minutes 01/10/20 0002       Devices    LINES / TUBES:      Continuous Infusions: . ciprofloxacin 400 mg (01/14/20 2110)  . dextrose 5% lactated ringers 50 mL/hr at 01/15/20 0602  . metronidazole 500 mg (01/15/20 0601)     Objective: Vitals:   01/14/20 0434 01/14/20 1615 01/14/20 2128 01/15/20 0450  BP: (!) 145/76 (!) 127/58 125/63 137/70  Pulse: 84 81 67 64  Resp: 18 18 17 17   Temp: 98.7 F (37.1 C) 98.1 F (36.7 C) 98.6 F (37 C) (!) 97.5 F (36.4 C)  TempSrc: Oral Axillary Oral Oral  SpO2: 100% 100% 100% 100%  Weight:      Height:        Intake/Output Summary (Last 24 hours) at 01/15/2020 [id number] Last data filed at 01/15/2020 0602 Gross per 24 hour  Intake 2496.8 ml  Output --  Net 2496.8 ml   Filed Weights   01/09/20 2044  Weight: 85.7 kg   Physical Exam:  General: A/O x4, no acute respiratory distress Eyes: negative scleral hemorrhage, negative anisocoria, negative icterus ENT: Negative Runny nose, negative gingival bleeding, Neck:  Negative scars, masses, torticollis, lymphadenopathy, JVD Lungs: Clear to auscultation bilaterally without wheezes or crackles Cardiovascular: Regular rate and rhythm without murmur gallop or rub normal S1 and S2 Abdomen: negative abdominal pain, nondistended, positive soft, bowel sounds, no rebound, no ascites, no appreciable mass Extremities: No significant cyanosis, clubbing, or edema bilateral lower extremities Skin: Negative rashes, lesions, ulcers Psychiatric:  Negative depression, negative anxiety, negative fatigue, negative mania  Central nervous system:  Cranial nerves II through XII intact, tongue/uvula midline, all extremities muscle strength 5/5, sensation intact throughout, finger nose finger bilateral within normal limits, quick finger touch bilateral within normal limits, negative  dysarthria, negative expressive aphasia, negative receptive aphasia.   .     Data Reviewed: Care during the described time interval was provided by me .  I have reviewed this patient's available data, including medical history, events of note, physical examination, and all test results as part of my evaluation.  CBC: Recent Labs  Lab 01/10/20 0308 01/10/20 0308 01/11/20 0243 01/12/20 0215 01/13/20 0307 01/14/20 0216 01/15/20 0323  WBC 14.2*   < > 14.9* 10.7* 6.6 5.3 5.0  NEUTROABS 13.0*  --  12.9* 8.4* 4.3 2.9  --   HGB 9.3*   < > 8.1* 7.6* 8.4* 8.3* 7.7*  HCT 29.3*   < > 24.7* 23.5* 26.9* 26.4* 24.7*  MCV 84.9   < > 82.3 84.5 85.9 85.7 85.2  PLT 391   < > 294 313 369 427* 411*   < > = values in this interval not displayed.   Basic Metabolic Panel: Recent Labs  Lab 01/11/20 0243 01/12/20 0215 01/13/20 0307 01/14/20 0216 01/14/20 1202 01/15/20 0323  NA 136 140 140 140  --  142  K 4.6 3.5 3.9 4.4  --  3.7  CL 103 105 103 105  --  106  CO2 23 24 25 25   --  26  GLUCOSE 119* 99 106* 105*  --  99  BUN 8 <  5* 5* 5*  --  <5*  CREATININE 0.99 0.94 1.05* 0.97  --  0.90  CALCIUM 8.9 8.9 9.1 9.1  --  8.7*  MG 1.8  --  1.4* 2.0 1.6* 2.1  PHOS  --   --   --   --  3.6 3.9   GFR: Estimated Creatinine Clearance: 71 mL/min (by C-G formula based on SCr of 0.9 mg/dL). Liver Function Tests: Recent Labs  Lab 01/09/20 2100 01/10/20 0308 01/15/20 0323  AST 19 22 18   ALT 17 18 12   ALKPHOS 55 51 47  BILITOT 0.3 0.6 0.5  PROT 7.2 7.0 5.6*  ALBUMIN 4.2 4.0 2.9*   Recent Labs  Lab 01/09/20 2100  LIPASE 37   No results for input(s): AMMONIA in the last 168 hours. Coagulation Profile: No results for input(s): INR, PROTIME in the last 168 hours. Cardiac Enzymes: No results for input(s): CKTOTAL, CKMB, CKMBINDEX, TROPONINI in the last 168 hours. BNP (last 3 results) No results for input(s): PROBNP in the last 8760 hours. HbA1C: No results for input(s): HGBA1C in the last 72  hours. CBG: No results for input(s): GLUCAP in the last 168 hours. Lipid Profile: No results for input(s): CHOL, HDL, LDLCALC, TRIG, CHOLHDL, LDLDIRECT in the last 72 hours. Thyroid Function Tests: No results for input(s): TSH, T4TOTAL, FREET4, T3FREE, THYROIDAB in the last 72 hours. Anemia Panel: Recent Labs    01/14/20 1202  VITAMINB12 1,757*  FOLATE 29.8  FERRITIN 53  TIBC 336  IRON 33  RETICCTPCT 1.7   Sepsis Labs: No results for input(s): PROCALCITON, LATICACIDVEN in the last 168 hours.  Recent Results (from the past 240 hour(s))  Urine culture     Status: None   Collection Time: 01/09/20 10:45 PM   Specimen: Urine, Clean Catch  Result Value Ref Range Status   Specimen Description   Final    URINE, CLEAN CATCH Performed at [facility], [address] [place], [place] [address]    Special Requests   Final    NONE Performed at [facility], [address] [place], [place] [address]    Culture   Final    NO GROWTH Performed at [facility], [place] [address], [place], [place] [address]    Report Status 01/11/2020 FINAL  Final  SARS CORONAVIRUS 2 (TAT 6-24 HRS) Nasopharyngeal Nasopharyngeal Swab     Status: None   Collection Time: 01/09/20 11:02 PM   Specimen: Nasopharyngeal Swab  Result Value Ref Range Status   SARS Coronavirus 2 NEGATIVE NEGATIVE Final    Comment: (NOTE) SARS-CoV-2 target nucleic acids are NOT DETECTED. The SARS-CoV-2 RNA is generally detectable in upper and lower respiratory specimens during the acute phase of infection. Negative results do not preclude SARS-CoV-2 infection, do not rule out co-infections with other pathogens, and should not be used as the sole basis for treatment or other patient management decisions. Negative results must be combined with clinical observations, patient history, and epidemiological information. The expected result is Negative. Fact Sheet for  Patients: SugarRoll.be Fact Sheet for Healthcare Providers: https://www.-mathews.com/ This test is not yet approved or cleared by the [place] FDA and  has been authorized for detection and/or diagnosis of SARS-CoV-2 by FDA under an Emergency Use Authorization (EUA). This EUA will remain  in effect (meaning this test can be used) for the duration of the COVID-19 declaration under Section 56 4(b)(1) of the Act, 21 U.S.C. section 360bbb-3(b)(1), unless the authorization is  terminated or revoked sooner. Performed at [facility], [place] [address], [place], [place] [address]          Radiology Studies: No results found.      Scheduled Meds: . aspirin EC  81 mg Oral Daily  . loratadine  10 mg Oral Daily  . metoCLOPramide (REGLAN) injection  5 mg Intravenous TID AC  . pantoprazole  40 mg Oral BID  . pravastatin  40 mg Oral Daily  . ticagrelor  90 mg Oral BID   Continuous Infusions: . ciprofloxacin 400 mg (01/14/20 2110)  . dextrose 5% lactated ringers 50 mL/hr at 01/15/20 0602  . metronidazole 500 mg (01/15/20 0601)     LOS: 6 days    Time spent:40 min    Rozena Fierro, Geraldo Docker, MD [facility] Pager [phone]  If 7PM-7AM, please contact night-coverage [email or web address] Password [email or web address] 01/15/2020, 9:10 AM

## 2020-01-16 LAB — COMPREHENSIVE METABOLIC PANEL
ALT: 13 U/L (ref 0–44)
AST: 17 U/L (ref 15–41)
Albumin: 2.8 g/dL — ABNORMAL LOW (ref 3.5–5.0)
Alkaline Phosphatase: 44 U/L (ref 38–126)
Anion gap: 9 (ref 5–15)
BUN: <5 mg/dL — ABNORMAL LOW (ref 8–23)
CO2: 26 mmol/L (ref 22–32)
Calcium: 8.7 mg/dL — ABNORMAL LOW (ref 8.9–10.3)
Chloride: 107 mmol/L (ref 98–111)
Creatinine, Ser: 0.88 mg/dL (ref 0.44–1.00)
GFR calc Af Amer: >60 mL/min (ref >60)
GFR calc non Af Amer: >60 mL/min (ref >60)
Glucose, Bld: 98 mg/dL (ref 70–99)
Potassium: 3.7 mmol/L (ref 3.5–5.1)
Sodium: 142 mmol/L (ref 135–145)
Total Bilirubin: 0.4 mg/dL (ref 0.3–1.2)
Total Protein: 5.4 g/dL — ABNORMAL LOW (ref 6.5–8.1)

## 2020-01-16 LAB — MAGNESIUM: Magnesium: 1.7 mg/dL (ref 1.7–2.4)

## 2020-01-16 LAB — CBC
HCT: 23.9 % — ABNORMAL LOW (ref 36.0–46.0)
Hemoglobin: 7.6 g/dL — ABNORMAL LOW (ref 12.0–15.0)
MCH: 26.9 pg (ref 26.0–34.0)
MCHC: 31.8 g/dL (ref 30.0–36.0)
MCV: 84.5 fL (ref 80.0–100.0)
Platelets: 404 10*3/uL — ABNORMAL HIGH (ref 150–400)
RBC: 2.83 MIL/uL — ABNORMAL LOW (ref 3.87–5.11)
RDW: 14.7 % (ref 11.5–15.5)
WBC: 5.2 10*3/uL (ref 4.0–10.5)
nRBC: 0 % (ref 0.0–0.2)

## 2020-01-16 LAB — PHOSPHORUS: Phosphorus: 3.8 mg/dL (ref 2.5–4.6)

## 2020-01-16 LAB — OCCULT BLOOD X 1 CARD TO LAB, STOOL: Fecal Occult Bld: NEGATIVE

## 2020-01-16 MED ORDER — LOPERAMIDE HCL 2 MG PO CAPS: 2.0000 mg | ORAL_CAPSULE | ORAL | Status: DC | PRN

## 2020-01-16 NOTE — Progress Notes (Signed)
PROGRESS NOTE    Nancy Tran  [id number] DOB: 1956-06-04 DOA: 01/09/2020 PCP: Lilian Coma., MD     Brief Narrative:  64 year old BF PMHx General Anxiety disorder, HTN, Dyslipidemia, Prediabetes LEFT intracerebral aneurysm status s/p Embolization.  GI bleed, Thyroid nodule, Hiradenitis,  who presented with abdominal pain and nausea. S Patient reported 2 to 3 days of mild abdominal discomfort, that progressed into severe pain on the day of admission. No fevers no chills. On her initial physical examination her blood pressure was 142/72, heart rate 95, respiratory rate 24, temperature 97.9, oxygen saturation 100%,her lungs had no wheezing or rhonchi, heart S1-S2 present and rhythmic, abdomen soft, positive tender in the mid abdomen without rebound or guarding, no lower extremity edema. Sodium 137, potassium 3.3, chloride 102, bicarb 24, glucose 107, BUN 24, creatinine 1.21, AST 19, ALT 17, total bilirubin 0.8, white count 8.1, hemoglobin 9.2, hematocrit 28.5, platelets 378.SARS COVID-19 negative.Urinalysis negative for infection. CT of the abdomen and pelvis with circumferential thickening of the ascending colon, with more focal phlegmonous change on the right sided colonic diverticula, consistent with acute diverticulitis.  Patient was started on intravenous fluids, bowel rest and broad spectrum antibiotic therapy.  Abdominal pain slowly improving, Hgb noted to be low and complains of dark stools, risk vs benefit patient has been continue on ticagrelor with good toleration.    Subjective: 3/21 A/O x4, negative CP, negative abdominal pain on full liquid diet.  Decreased diarrhea.    Assessment & Plan:   Principal Problem:   Acute diverticulitis Active Problems:   Cerebral aneurysm   Essential hypertension   GAD (generalized anxiety disorder)   Hyperlipidemia LDL goal <130   Normochromic anemia   Prediabetes   Hypokalemia   Occult GI bleeding   AKI (acute  kidney injury) ([place])   Hypomagnesemia   Acute right sided diverticulitis/ lower GI bleeding/ Acute blood loss anemia. Recent Labs  Lab 01/12/20 0215 01/13/20 0307 01/14/20 0216 01/15/20 0323 01/16/20 0233  HGB 7.6* 8.4* 8.3* 7.7* 7.6*  -3/14 positive fecal occult blood -3/20 fecal occult blood negative -3/21 fecal occult blood negative -Continue IV antibiotics continue antibiotic therapy withIV ciprofloxacin and metronidazole. Continue pain control withhydromorphone. Onantiacids and tid metoclopramide for nausea control.  -Hemoglobin remains low but stable continue to monitor.   ticagrelor. -D5 lactated Ringer's  50 ml/hr  -Anemia panel consistent with normocytic anemia.  Continued slight drop most likely secondary to delusional as she continues to get fluids. -Transfuse for hemoglobin<7 -3/21 advance diet to bland.  BRAT (banana, rice, applesauce, toast) continue all liquids tolerated.   AKI (baseline Cr 0.90 taking 03/28/2013) Recent Labs  Lab 01/12/20 0215 01/13/20 0307 01/14/20 0216 01/15/20 0323 01/16/20 0233  CREATININE 0.94 1.05* 0.97 0.90 0.88  -Back to baseline  Hypokalemia -Resolved  Hypomagnesmia -Magnesium goal> 2 -Magnesium IV 3 g .  Essential HTN. -BP controlled without antihypertensive continue to hold.   Cerebral Aneurysm/ dyslipidemia. -Brilinta 90 mg daily    DVT prophylaxis: SCD Code Status: Full Family Communication: 3/21 husband at bedside discussed plan of care answered all questions Disposition Plan: TBD 1.  Where the patient is from 2.  Anticipated d/c place. 3.  Barriers to d/c; DC next 24 to 48 hours   Consultants:    Procedures/Significant Events:    I have personally reviewed and interpreted all radiology studies and my findings are as above.  VENTILATOR SETTINGS:    Cultures   Antimicrobials: Anti-infectives (From admission, onward)   Start  Dose/Rate Stop   01/10/20 1000  ciprofloxacin (CIPRO) IVPB 400  mg     400 mg 200 mL/hr over 60 Minutes     01/10/20 0600  metroNIDAZOLE (FLAGYL) IVPB 500 mg     500 mg 100 mL/hr over 60 Minutes     01/09/20 2230  ciprofloxacin (CIPRO) IVPB 400 mg     400 mg 200 mL/hr over 60 Minutes 01/10/20 0000   01/09/20 2230  metroNIDAZOLE (FLAGYL) IVPB 500 mg     500 mg 100 mL/hr over 60 Minutes 01/10/20 0002       Devices    LINES / TUBES:      Continuous Infusions: . ciprofloxacin 400 mg (01/16/20 0932)  . dextrose 5% lactated ringers 50 mL/hr at 01/15/20 1425  . metronidazole 500 mg (01/16/20 0550)     Objective: Vitals:   01/15/20 0450 01/15/20 1518 01/15/20 2214 01/16/20 0601  BP: 137/70 (!) 144/65 135/66 132/79  Pulse: 64 68 68 76  Resp: 17 18 17 17   Temp: (!) 97.5 F (36.4 C) 98.1 F (36.7 C) 98.7 F (37.1 C) 98.9 F (37.2 C)  TempSrc: Oral Oral Oral Oral  SpO2: 100% 100% 100% 100%  Weight:      Height:        Intake/Output Summary (Last 24 hours) at 01/16/2020 1016 Last data filed at 01/16/2020 0740 Gross per 24 hour  Intake 1413.19 ml  Output 300 ml  Net 1113.19 ml   Filed Weights   01/09/20 2044  Weight: 85.7 kg   Physical Exam:  General: A/O x4, no acute respiratory distress Eyes: negative scleral hemorrhage, negative anisocoria, negative icterus ENT: Negative Runny nose, negative gingival bleeding, Neck:  Negative scars, masses, torticollis, lymphadenopathy, JVD Lungs: Clear to auscultation bilaterally without wheezes or crackles Cardiovascular: Regular rate and rhythm without murmur gallop or rub normal S1 and S2 Abdomen: negative abdominal pain, nondistended, positive soft, bowel sounds, no rebound, no ascites, no appreciable mass Extremities: No significant cyanosis, clubbing, or edema bilateral lower extremities Skin: Negative rashes, lesions, ulcers Psychiatric:  Negative depression, negative anxiety, negative fatigue, negative mania  Central nervous system:  Cranial nerves II through XII intact,  tongue/uvula midline, all extremities muscle strength 5/5, sensation intact throughout, negative dysarthria, negative expressive aphasia, negative receptive aphasia.   .     Data Reviewed: Care during the described time interval was provided by me .  I have reviewed this patient's available data, including medical history, events of note, physical examination, and all test results as part of my evaluation.  CBC: Recent Labs  Lab 01/10/20 0308 01/10/20 0308 01/11/20 0243 01/11/20 0243 01/12/20 0215 01/13/20 [id number] 01/14/20 0216 01/15/20 0323 01/16/20 0233  WBC 14.2*   < > 14.9*   < > 10.7* 6.6 5.3 5.0 5.2  NEUTROABS 13.0*  --  12.9*  --  8.4* 4.3 2.9  --   --   HGB 9.3*   < > 8.1*   < > 7.6* 8.4* 8.3* 7.7* 7.6*  HCT 29.3*   < > 24.7*   < > 23.5* 26.9* 26.4* 24.7* 23.9*  MCV 84.9   < > 82.3   < > 84.5 85.9 85.7 85.2 84.5  PLT 391   < > 294   < > 313 369 427* 411* 404*   < > = values in this interval not displayed.   Basic Metabolic Panel: Recent Labs  Lab 01/11/20 0243 01/12/20 0215 01/13/20 [id number] 01/14/20 0216 01/14/20 1202 01/15/20 0323 01/16/20 0233  NA  --  140 140 140  --  142 142  K  --  3.5 3.9 4.4  --  3.7 3.7  CL  --  105 103 105  --  106 107  CO2  --  24 25 25   --  26 26  GLUCOSE  --  99 106* 105*  --  99 98  BUN  --  <5* 5* 5*  --  <5* <5*  CREATININE  --  0.94 1.05* 0.97  --  0.90 0.88  CALCIUM  --  8.9 9.1 9.1  --  8.7* 8.7*  MG   < >  --  1.4* 2.0 1.6* 2.1 1.7  PHOS  --   --   --   --  3.6 3.9 3.8   < > = values in this interval not displayed.   GFR: Estimated Creatinine Clearance: 72.6 mL/min (by C-G formula based on SCr of 0.88 mg/dL). Liver Function Tests: Recent Labs  Lab 01/09/20 2100 01/10/20 0308 01/15/20 0323 01/16/20 0233  AST 19 22 18 17   ALT 17 18 12 13   ALKPHOS 55 51 47 44  BILITOT 0.3 0.6 0.5 0.4  PROT 7.2 7.0 5.6* 5.4*  ALBUMIN 4.2 4.0 2.9* 2.8*   Recent Labs  Lab 01/09/20 2100  LIPASE 37   No results for input(s): AMMONIA  in the last 168 hours. Coagulation Profile: No results for input(s): INR, PROTIME in the last 168 hours. Cardiac Enzymes: No results for input(s): CKTOTAL, CKMB, CKMBINDEX, TROPONINI in the last 168 hours. BNP (last 3 results) No results for input(s): PROBNP in the last 8760 hours. HbA1C: No results for input(s): HGBA1C in the last 72 hours. CBG: No results for input(s): GLUCAP in the last 168 hours. Lipid Profile: No results for input(s): CHOL, HDL, LDLCALC, TRIG, CHOLHDL, LDLDIRECT in the last 72 hours. Thyroid Function Tests: No results for input(s): TSH, T4TOTAL, FREET4, T3FREE, THYROIDAB in the last 72 hours. Anemia Panel: Recent Labs    01/14/20 1202  VITAMINB12 1,757*  FOLATE 29.8  FERRITIN 53  TIBC 336  IRON 33  RETICCTPCT 1.7   Sepsis Labs: No results for input(s): PROCALCITON, LATICACIDVEN in the last 168 hours.  Recent Results (from the past 240 hour(s))  Urine culture     Status: None   Collection Time: 01/09/20 10:45 PM   Specimen: Urine, Clean Catch  Result Value Ref Range Status   Specimen Description   Final    URINE, CLEAN CATCH Performed at [facility], [address] [place], [place] [address]    Special Requests   Final    NONE Performed at [facility], [address] [place], [place] [address]    Culture   Final    NO GROWTH Performed at [facility], [place] [address] [place], [place] [address]    Report Status 01/11/2020 FINAL  Final  SARS CORONAVIRUS 2 (TAT 6-24 HRS) Nasopharyngeal Nasopharyngeal Swab     Status: None   Collection Time: 01/09/20 11:02 PM   Specimen: Nasopharyngeal Swab  Result Value Ref Range Status   SARS Coronavirus 2 NEGATIVE NEGATIVE Final    Comment: (NOTE) SARS-CoV-2 target nucleic acids are NOT DETECTED. The SARS-CoV-2 RNA is generally detectable in upper and lower respiratory specimens during the acute phase of infection. Negative results do not preclude SARS-CoV-2 infection, do not  rule out co-infections with other pathogens, and should not be used as the sole basis for treatment or other patient management decisions.  Negative results must be combined with clinical observations, patient history, and epidemiological information. The expected result is Negative. Fact Sheet for Patients: SugarRoll.be Fact Sheet for Healthcare Providers: https://www.Nancy Betton-mathews.com/ This test is not yet approved or cleared by the [place] FDA and  has been authorized for detection and/or diagnosis of SARS-CoV-2 by FDA under an Emergency Use Authorization (EUA). This EUA will remain  in effect (meaning this test can be used) for the duration of the COVID-19 declaration under Section 56 4(b)(1) of the Act, 21 U.S.C. section 360bbb-3(b)(1), unless the authorization is terminated or revoked sooner. Performed at [facility], [place] [address], [place], [place] [address]          Radiology Studies: No results found.      Scheduled Meds: . aspirin EC  81 mg Oral Daily  . loratadine  10 mg Oral Daily  . metoCLOPramide (REGLAN) injection  5 mg Intravenous TID AC  . pantoprazole  40 mg Oral BID  . pravastatin  40 mg Oral Daily  . ticagrelor  90 mg Oral BID   Continuous Infusions: . ciprofloxacin 400 mg (01/16/20 0932)  . dextrose 5% lactated ringers 50 mL/hr at 01/15/20 1425  . metronidazole 500 mg (01/16/20 0550)     LOS: 7 days    Time spent:40 min    Garnet Overfield, Geraldo Docker, MD [facility] Pager [phone]  If 7PM-7AM, please contact night-coverage [email or web address] Password [facility] 01/16/2020, 10:16 AM

## 2020-01-16 NOTE — Plan of Care (Signed)
  Problem: Education: Goal: Knowledge of General Education information will improve Description Including pain rating scale, medication(s)/side effects and non-pharmacologic comfort measures Outcome: Progressing   Problem: Health Behavior/Discharge Planning: Goal: Ability to manage health-related needs will improve Outcome: Progressing   Problem: Clinical Measurements: Goal: Ability to maintain clinical measurements within normal limits will improve Outcome: Progressing Goal: Diagnostic test results will improve Outcome: Progressing   Problem: Activity: Goal: Risk for activity intolerance will decrease Outcome: Progressing   Problem: Nutrition: Goal: Adequate nutrition will be maintained Outcome: Progressing   Problem: Pain Managment: Goal: General experience of comfort will improve Outcome: Progressing   Problem: Safety: Goal: Ability to remain free from injury will improve Outcome: Progressing   Problem: Skin Integrity: Goal: Risk for impaired skin integrity will decrease Outcome: Progressing   

## 2020-01-17 LAB — COMPREHENSIVE METABOLIC PANEL
ALT: 15 U/L (ref 0–44)
AST: 21 U/L (ref 15–41)
Albumin: 2.9 g/dL — ABNORMAL LOW (ref 3.5–5.0)
Alkaline Phosphatase: 47 U/L (ref 38–126)
Anion gap: 10 (ref 5–15)
BUN: <5 mg/dL — ABNORMAL LOW (ref 8–23)
CO2: 26 mmol/L (ref 22–32)
Calcium: 9 mg/dL (ref 8.9–10.3)
Chloride: 107 mmol/L (ref 98–111)
Creatinine, Ser: 0.84 mg/dL (ref 0.44–1.00)
GFR calc Af Amer: >60 mL/min (ref >60)
GFR calc non Af Amer: >60 mL/min (ref >60)
Glucose, Bld: 90 mg/dL (ref 70–99)
Potassium: 4 mmol/L (ref 3.5–5.1)
Sodium: 143 mmol/L (ref 135–145)
Total Bilirubin: 0.3 mg/dL (ref 0.3–1.2)
Total Protein: 5.7 g/dL — ABNORMAL LOW (ref 6.5–8.1)

## 2020-01-17 LAB — CBC
HCT: 25.2 % — ABNORMAL LOW (ref 36.0–46.0)
Hemoglobin: 7.9 g/dL — ABNORMAL LOW (ref 12.0–15.0)
MCH: 26.5 pg (ref 26.0–34.0)
MCHC: 31.3 g/dL (ref 30.0–36.0)
MCV: 84.6 fL (ref 80.0–100.0)
Platelets: 464 10*3/uL — ABNORMAL HIGH (ref 150–400)
RBC: 2.98 MIL/uL — ABNORMAL LOW (ref 3.87–5.11)
RDW: 14.9 % (ref 11.5–15.5)
WBC: 5.1 10*3/uL (ref 4.0–10.5)
nRBC: 0 % (ref 0.0–0.2)

## 2020-01-17 LAB — MAGNESIUM: Magnesium: 1.5 mg/dL — ABNORMAL LOW (ref 1.7–2.4)

## 2020-01-17 LAB — PHOSPHORUS: Phosphorus: 4 mg/dL (ref 2.5–4.6)

## 2020-01-17 MED ORDER — MAGNESIUM SULFATE 50 % IJ SOLN
3.0000 g | Freq: Once | INTRAVENOUS | Status: AC
  Administered 2020-01-17: 3 g via INTRAVENOUS
  Filled 2020-01-17 (×3): qty 6

## 2020-01-17 NOTE — Progress Notes (Signed)
Nancy Tran would like the medical team to know she has a potential follow-up appointment with the neurosurgeon at [facility] on Wednesday, March 24th.  She questions if she will be medically ready for discharge by that date.

## 2020-01-17 NOTE — Progress Notes (Signed)
PROGRESS NOTE    Naraly Goodloe  [id number] DOB: 02/04/1956 DOA: 01/09/2020 PCP: Lilian Coma., MD     Brief Narrative:  64 year old BF PMHx General Anxiety disorder, HTN, Dyslipidemia, Prediabetes LEFT intracerebral aneurysm status s/p Embolization.  GI bleed, Thyroid nodule, Hiradenitis,  who presented with abdominal pain and nausea. S Patient reported 2 to 3 days of mild abdominal discomfort, that progressed into severe pain on the day of admission. No fevers no chills. On her initial physical examination her blood pressure was 142/72, heart rate 95, respiratory rate 24, temperature 97.9, oxygen saturation 100%,her lungs had no wheezing or rhonchi, heart S1-S2 present and rhythmic, abdomen soft, positive tender in the mid abdomen without rebound or guarding, no lower extremity edema. Sodium 137, potassium 3.3, chloride 102, bicarb 24, glucose 107, BUN 24, creatinine 1.21, AST 19, ALT 17, total bilirubin 0.8, white count 8.1, hemoglobin 9.2, hematocrit 28.5, platelets 378.SARS COVID-19 negative.Urinalysis negative for infection. CT of the abdomen and pelvis with circumferential thickening of the ascending colon, with more focal phlegmonous change on the right sided colonic diverticula, consistent with acute diverticulitis.  Patient was started on intravenous fluids, bowel rest and broad spectrum antibiotic therapy.  Abdominal pain slowly improving, Hgb noted to be low and complains of dark stools, risk vs benefit patient has been continue on ticagrelor with good toleration.    Subjective: 3/22 A/O x4, negative CP, negative abdominal pain, negative N/V/D on bland diet     Assessment & Plan:   Principal Problem:   Acute diverticulitis Active Problems:   Cerebral aneurysm   Essential hypertension   GAD (generalized anxiety disorder)   Hyperlipidemia LDL goal <130   Normochromic anemia   Prediabetes   Hypokalemia   Occult GI bleeding   AKI (acute kidney injury)  ([place])   Hypomagnesemia   Acute right sided diverticulitis/ lower GI bleeding/ Acute blood loss anemia. Recent Labs  Lab 01/13/20 0307 01/14/20 0216 01/15/20 0323 01/16/20 0233 01/17/20 0547  HGB 8.4* 8.3* 7.7* 7.6* 7.9*  -3/14 positive fecal occult blood -3/20 fecal occult blood negative -3/21 fecal occult blood negative -Continue IV antibiotics continue antibiotic therapy withIV ciprofloxacin and metronidazole. Continue pain control withhydromorphone. Onantiacids and tid metoclopramide for nausea control.  -Hemoglobin remains low but stable continue to monitor.   ticagrelor. -D5 lactated Ringer's  50 ml/hr  -Anemia panel consistent with normocytic anemia.  Continued slight drop most likely secondary to delusional as she continues to get fluids. -Transfuse for hemoglobin<7 -3/21 advance diet to bland.  BRAT (banana, rice, applesauce, toast) continue all liquids tolerated. -3/22 tolerated bland diet overnight well.  Advance to heart healthy if patient tolerates overnight will discharge in a.m.  AKI (baseline Cr 0.90 taking 03/28/2013) Recent Labs  Lab 01/13/20 0307 01/14/20 0216 01/15/20 0323 01/16/20 0233 01/17/20 0547  CREATININE 1.05* 0.97 0.90 0.88 0.84  -Back to baseline  Hypokalemia -Resolved  Hypomagnesmia -Magnesium goal> 2 -Magnesium IV 3 g .  Essential HTN. -BP controlled without antihypertensive continue to hold.   Cerebral Aneurysm/ dyslipidemia. -Brilinta 90 mg daily    DVT prophylaxis: SCD Code Status: Full Family Communication: 3/21 husband at bedside discussed plan of care answered all questions Disposition Plan: TBD 1.  Where the patient is from 2.  Anticipated d/c place. Barriers to d/c unresolved acute right sided diverticulitis/ lower GI bleeding/ Acute blood loss anemia.; DC next 24 to 48 hours, still receiving IV electrolyte replacement.  Just began to eat.   Consultants:    Procedures/Significant Events:  I have personally  reviewed and interpreted all radiology studies and my findings are as above.  VENTILATOR SETTINGS:    Cultures   Antimicrobials: Anti-infectives (From admission, onward)   Start     Dose/Rate Stop   01/10/20 1000  ciprofloxacin (CIPRO) IVPB 400 mg  Status:  Discontinued     400 mg 200 mL/hr over 60 Minutes 01/16/20 1817   01/10/20 0600  metroNIDAZOLE (FLAGYL) IVPB 500 mg  Status:  Discontinued     500 mg 100 mL/hr over 60 Minutes 01/16/20 1817   01/09/20 2230  ciprofloxacin (CIPRO) IVPB 400 mg     400 mg 200 mL/hr over 60 Minutes 01/10/20 0000   01/09/20 2230  metroNIDAZOLE (FLAGYL) IVPB 500 mg     500 mg 100 mL/hr over 60 Minutes 01/10/20 0002       Devices    LINES / TUBES:      Continuous Infusions: . dextrose 5% lactated ringers 50 mL/hr at 01/16/20 1359     Objective: Vitals:   01/16/20 0601 01/16/20 1440 01/16/20 2129 01/17/20 0448  BP: 132/79 125/65 (!) 119/52 135/68  Pulse: 76 75 65 67  Resp: 17 18 18 16   Temp: 98.9 F (37.2 C) 98 F (36.7 C) 98.8 F (37.1 C) 99 F (37.2 C)  TempSrc: Oral Oral Oral Oral  SpO2: 100% 100% 100% 100%  Weight:      Height:        Intake/Output Summary (Last 24 hours) at 01/17/2020 1055 Last data filed at 01/17/2020 [id number] Gross per 24 hour  Intake 3235 ml  Output --  Net 3235 ml   Filed Weights   01/09/20 2044  Weight: 85.7 kg   Physical Exam:  General: A/O x4, no acute respiratory distress Eyes: negative scleral hemorrhage, negative anisocoria, negative icterus ENT: Negative Runny nose, negative gingival bleeding, Neck:  Negative scars, masses, torticollis, lymphadenopathy, JVD Lungs: Clear to auscultation bilaterally without wheezes or crackles Cardiovascular: Regular rate and rhythm without murmur gallop or rub normal S1 and S2 Abdomen: negative abdominal pain, nondistended, positive soft, bowel sounds, no rebound, no ascites, no appreciable mass Extremities: No significant cyanosis, clubbing, or edema  bilateral lower extremities Skin: Negative rashes, lesions, ulcers Psychiatric:  Negative depression, negative anxiety, negative fatigue, negative mania  Central nervous system:  Cranial nerves II through XII intact, tongue/uvula midline, all extremities muscle strength 5/5, sensation intact throughout, negative dysarthria, negative expressive aphasia, negative receptive aphasia.   .     Data Reviewed: Care during the described time interval was provided by me .  I have reviewed this patient's available data, including medical history, events of note, physical examination, and all test results as part of my evaluation.  CBC: Recent Labs  Lab 01/11/20 0243 01/11/20 0243 01/12/20 0215 01/12/20 0215 01/13/20 [id number] 01/14/20 0216 01/15/20 0323 01/16/20 0233 01/17/20 0547  WBC 14.9*   < > 10.7*   < > 6.6 5.3 5.0 5.2 5.1  NEUTROABS 12.9*  --  8.4*  --  4.3 2.9  --   --   --   HGB 8.1*   < > 7.6*   < > 8.4* 8.3* 7.7* 7.6* 7.9*  HCT 24.7*   < > 23.5*   < > 26.9* 26.4* 24.7* 23.9* 25.2*  MCV 82.3   < > 84.5   < > 85.9 85.7 85.2 84.5 84.6  PLT 294   < > 313   < > 369 427* 411* 404* 464*   < > = values  in this interval not displayed.   Basic Metabolic Panel: Recent Labs  Lab 01/13/20 0307 01/13/20 0307 01/14/20 0216 01/14/20 1202 01/15/20 0323 01/16/20 0233 01/17/20 0547  NA 140  --  140  --  142 142 143  K 3.9  --  4.4  --  3.7 3.7 4.0  CL 103  --  105  --  106 107 107  CO2 25  --  25  --  26 26 26   GLUCOSE 106*  --  105*  --  99 98 90  BUN 5*  --  5*  --  <5* <5* <5*  CREATININE 1.05*  --  0.97  --  0.90 0.88 0.84  CALCIUM 9.1  --  9.1  --  8.7* 8.7* 9.0  MG 1.4*   < > 2.0 1.6* 2.1 1.7 1.5*  PHOS  --   --   --  3.6 3.9 3.8 4.0   < > = values in this interval not displayed.   GFR: Estimated Creatinine Clearance: 76.1 mL/min (by C-G formula based on SCr of 0.84 mg/dL). Liver Function Tests: Recent Labs  Lab 01/15/20 0323 01/16/20 0233 01/17/20 0547  AST 18 17 21   ALT  12 13 15   ALKPHOS 47 44 47  BILITOT 0.5 0.4 0.3  PROT 5.6* 5.4* 5.7*  ALBUMIN 2.9* 2.8* 2.9*   No results for input(s): LIPASE, AMYLASE in the last 168 hours. No results for input(s): AMMONIA in the last 168 hours. Coagulation Profile: No results for input(s): INR, PROTIME in the last 168 hours. Cardiac Enzymes: No results for input(s): CKTOTAL, CKMB, CKMBINDEX, TROPONINI in the last 168 hours. BNP (last 3 results) No results for input(s): PROBNP in the last 8760 hours. HbA1C: No results for input(s): HGBA1C in the last 72 hours. CBG: No results for input(s): GLUCAP in the last 168 hours. Lipid Profile: No results for input(s): CHOL, HDL, LDLCALC, TRIG, CHOLHDL, LDLDIRECT in the last 72 hours. Thyroid Function Tests: No results for input(s): TSH, T4TOTAL, FREET4, T3FREE, THYROIDAB in the last 72 hours. Anemia Panel: Recent Labs    01/14/20 1202  VITAMINB12 1,757*  FOLATE 29.8  FERRITIN 53  TIBC 336  IRON 33  RETICCTPCT 1.7   Sepsis Labs: No results for input(s): PROCALCITON, LATICACIDVEN in the last 168 hours.  Recent Results (from the past 240 hour(s))  Urine culture     Status: None   Collection Time: 01/09/20 10:45 PM   Specimen: Urine, Clean Catch  Result Value Ref Range Status   Specimen Description   Final    URINE, CLEAN CATCH Performed at [facility], [address] [place], [place] [address]    Special Requests   Final    NONE Performed at [facility], [address], [place] [address]    Culture   Final    NO GROWTH Performed at [facility], [place] [address], [place], [place] [address]    Report Status 01/11/2020 FINAL  Final  SARS CORONAVIRUS 2 (TAT 6-24 HRS) Nasopharyngeal Nasopharyngeal Swab     Status: None   Collection Time: 01/09/20 11:02 PM   Specimen: Nasopharyngeal Swab  Result Value Ref Range Status   SARS Coronavirus 2 NEGATIVE NEGATIVE Final    Comment: (NOTE) SARS-CoV-2 target nucleic acids are  NOT DETECTED. The SARS-CoV-2 RNA is generally detectable in upper and lower respiratory specimens during the acute phase of infection. Negative results do not preclude SARS-CoV-2 infection, do not rule  out co-infections with other pathogens, and should not be used as the sole basis for treatment or other patient management decisions. Negative results must be combined with clinical observations, patient history, and epidemiological information. The expected result is Negative. Fact Sheet for Patients: SugarRoll.be Fact Sheet for Healthcare Providers: https://www.Brendaly [employer]sel-mathews.com/ This test is not yet approved or cleared by the [place] FDA and  has been authorized for detection and/or diagnosis of SARS-CoV-2 by FDA under an Emergency Use Authorization (EUA). This EUA will remain  in effect (meaning this test can be used) for the duration of the COVID-19 declaration under Section 56 4(b)(1) of the Act, 21 U.S.C. section 360bbb-3(b)(1), unless the authorization is terminated or revoked sooner. Performed at [facility], [place] [address], [place], [place] [address]          Radiology Studies: No results found.      Scheduled Meds: . aspirin EC  81 mg Oral Daily  . loratadine  10 mg Oral Daily  . metoCLOPramide (REGLAN) injection  5 mg Intravenous TID AC  . pantoprazole  40 mg Oral BID  . pravastatin  40 mg Oral Daily  . ticagrelor  90 mg Oral BID   Continuous Infusions: . dextrose 5% lactated ringers 50 mL/hr at 01/16/20 1359     LOS: 8 days    Time spent:40 min    Ashleigh Arya, Geraldo Docker, MD [facility] Pager [phone]  If 7PM-7AM, please contact night-coverage [email or web address] Password [facility] 01/17/2020, 10:55 AM

## 2020-01-18 LAB — COMPREHENSIVE METABOLIC PANEL
ALT: 15 U/L (ref 0–44)
AST: 20 U/L (ref 15–41)
Albumin: 3 g/dL — ABNORMAL LOW (ref 3.5–5.0)
Alkaline Phosphatase: 44 U/L (ref 38–126)
Anion gap: 7 (ref 5–15)
BUN: 5 mg/dL — ABNORMAL LOW (ref 8–23)
CO2: 24 mmol/L (ref 22–32)
Calcium: 8.6 mg/dL — ABNORMAL LOW (ref 8.9–10.3)
Chloride: 110 mmol/L (ref 98–111)
Creatinine, Ser: 0.9 mg/dL (ref 0.44–1.00)
GFR calc Af Amer: >60 mL/min (ref >60)
GFR calc non Af Amer: >60 mL/min (ref >60)
Glucose, Bld: 91 mg/dL (ref 70–99)
Potassium: 3.9 mmol/L (ref 3.5–5.1)
Sodium: 141 mmol/L (ref 135–145)
Total Bilirubin: 0.5 mg/dL (ref 0.3–1.2)
Total Protein: 5.7 g/dL — ABNORMAL LOW (ref 6.5–8.1)

## 2020-01-18 LAB — CBC
HCT: 25.2 % — ABNORMAL LOW (ref 36.0–46.0)
Hemoglobin: 8 g/dL — ABNORMAL LOW (ref 12.0–15.0)
MCH: 26.7 pg (ref 26.0–34.0)
MCHC: 31.7 g/dL (ref 30.0–36.0)
MCV: 84 fL (ref 80.0–100.0)
Platelets: 508 10*3/uL — ABNORMAL HIGH (ref 150–400)
RBC: 3 MIL/uL — ABNORMAL LOW (ref 3.87–5.11)
RDW: 15.1 % (ref 11.5–15.5)
WBC: 5.7 10*3/uL (ref 4.0–10.5)
nRBC: 0 % (ref 0.0–0.2)

## 2020-01-18 LAB — MAGNESIUM: Magnesium: 2 mg/dL (ref 1.7–2.4)

## 2020-01-18 LAB — PHOSPHORUS: Phosphorus: 4.1 mg/dL (ref 2.5–4.6)

## 2020-01-18 MED ORDER — LORATADINE 10 MG PO TABS: 10.0000 mg | ORAL_TABLET | Freq: Every day | ORAL | 0 refills | Status: DC

## 2020-01-18 NOTE — Plan of Care (Signed)

## 2020-01-18 NOTE — Discharge Summary (Signed)
Physician Discharge Summary  Nancy Tran [id number] DOB: 02-Oct-1956 DOA: 01/09/2020  PCP: Lilian Coma., MD  Admit date: 01/09/2020 Discharge date: 01/18/2020  Time spent: 30 minutes  Recommendations for Outpatient Follow-up:   Acute right sided diverticulitis/ lower GI bleeding/ Acute blood loss anemia. Recent Labs  Lab 01/14/20 0216 01/15/20 0323 01/16/20 0233 01/17/20 0547 01/18/20 0138  HGB 8.3* 7.7* 7.6* 7.9* 8.0*  -3/14 positive fecal occult blood -3/20 fecal occult blood negative -3/21 fecal occult blood negative -Completed IV antibiotics therapy withIV ciprofloxacin and metronidazole. -Anemia panel consistent with normocytic anemia.    Stabilized -Transfuse for hemoglobin<7 -3/22 tolerated bland diet overnight well.    Tolerated Heart healthy diet overnight.   -3/23 stable for discharge -Follow-up in 1 to 2 weeks with Dr. Tiana Loft right sided diverticulitis with lower GI bleed  AKI (baseline Cr 0.90 taking 03/28/2013) Recent Labs  Lab 01/14/20 0216 01/15/20 0323 01/16/20 0233 01/17/20 0547 01/18/20 0138  CREATININE 0.97 0.90 0.88 0.84 0.90  -Back to baseline  Hypokalemia -Resolved  Hypomagnesmia -Resolved .  Essential HTN. -BP controlled without antihypertensive continue to hold.   Cerebral Aneurysm/ dyslipidemia. -Brilinta 90 mg daily    Discharge Diagnoses:  Principal Problem:   Acute diverticulitis Active Problems:   Cerebral aneurysm   Essential hypertension   GAD (generalized anxiety disorder)   Hyperlipidemia LDL goal <130   Normochromic anemia   Prediabetes   Hypokalemia   Occult GI bleeding   AKI (acute kidney injury) ([employer])   Hypomagnesemia   Discharge Condition: Stable  Diet recommendation: Heart healthy  Filed Weights   01/09/20 2044  Weight: 85.7 kg    History of present illness:  64 year old BF PMHx General Anxiety disorder, HTN, Dyslipidemia, Prediabetes LEFT intracerebral aneurysm status s/p  Embolization.  GI bleed, Thyroid nodule, Hiradenitis,  who presented with abdominal pain and nausea. S Patient reported 2 to 3 days of mild abdominal discomfort, that progressed into severe pain on the day of admission. No fevers no chills. On her initial physical examination her blood pressure was 142/72, heart rate 95, respiratory rate 24, temperature 97.9, oxygen saturation 100%,her lungs had no wheezing or rhonchi, heart S1-S2 present and rhythmic, abdomen soft, positive tender in the mid abdomen without rebound or guarding, no lower extremity edema. Sodium 137, potassium 3.3, chloride 102, bicarb 24, glucose 107, BUN 24, creatinine 1.21, AST 19, ALT 17, total bilirubin 0.8, white count 8.1, hemoglobin 9.2, hematocrit 28.5, platelets 378.SARS COVID-19negative.Urinalysis negative for infection. CT of the abdomen and pelvis with circumferential thickening of the ascending colon, with more focal phlegmonous change on the right sided colonic diverticula, consistent with acute diverticulitis.  Patient was started on intravenous fluids, bowel rest and broad spectrum antibiotic therapy.  Abdominal pain slowly improving, Hgb noted to be low and complains of dark stools, risk vs benefit patient has been continue on ticagrelor with good toleration.  Hospital Course:  Treated for diverticulitis and acute blood loss anemia.  Patient responded well to IV antibiotics and fluid resuscitation.  Now stable for discharge.  Procedures: 3/14 CT abdomen pelvis W contrast:-Circumferential thickening of the ascending colon with more focal phlegmonous change which appears to be centered on several right-sided colonic diverticula.  -No extraluminal gas or fluid collection is seen to suggest perforation. Findings are most suggestive of acute diverticulitis. -Minimal thickening at the appendiceal base with a more normal appearing appendiceal tip favored to be reactive to the above process. -Mild grade 1  anterolisthesis L4 on 5 without pars defects. -  Aortic Atherosclerosis (ICD10-I70.0).   Cultures   3/14 SARS coronavirus negative 3/15 HIV negative  Antibiotics Anti-infectives (From admission, onward)   Start     Dose/Rate Stop   01/10/20 1000  ciprofloxacin (CIPRO) IVPB 400 mg  Status:  Discontinued     400 mg 200 mL/hr over 60 Minutes 01/16/20 1817   01/10/20 0600  metroNIDAZOLE (FLAGYL) IVPB 500 mg  Status:  Discontinued     500 mg 100 mL/hr over 60 Minutes 01/16/20 1817   01/09/20 2230  ciprofloxacin (CIPRO) IVPB 400 mg     400 mg 200 mL/hr over 60 Minutes 01/10/20 0000   01/09/20 2230  metroNIDAZOLE (FLAGYL) IVPB 500 mg     500 mg 100 mL/hr over 60 Minutes 01/10/20 0002       Discharge Exam: Vitals:   01/17/20 0448 01/17/20 1414 01/17/20 2131 01/18/20 0552  BP: 135/68 135/79 126/65 136/76  Pulse: 67 83 67 67  Resp: 16 18 16 16   Temp: 99 F (37.2 C) 97.7 F (36.5 C) 98.6 F (37 C) 98.7 F (37.1 C)  TempSrc: Oral Oral Oral Oral  SpO2: 100% 100% 99% 100%  Weight:      Height:        General: A/O x4, no acute respiratory distress Eyes: negative scleral hemorrhage, negative anisocoria, negative icterus ENT: Negative Runny nose, negative gingival bleeding, Neck:  Negative scars, masses, torticollis, lymphadenopathy, JVD Lungs: Clear to auscultation bilaterally without wheezes or crackles Cardiovascular: Regular rate and rhythm without murmur gallop or rub normal S1 and S2 Abdomen: negative abdominal pain, nondistended, positive soft, bowel sounds, no rebound, no ascites, no appreciable mass   Discharge Instructions   Allergies as of 01/18/2020      Reactions   Codeine    Darvocet [propoxyphene N-acetaminophen]    Lisinopril Other (See Comments)   Morphine And Related Nausea And Vomiting   Amoxicillin-pot Clavulanate Rash   Other reaction(s): RASH Other reaction(s): RASH   Penicillins Rash      Medication List    STOP taking these medications    amLODipine 10 MG tablet Commonly known as: NORVASC   hydrochlorothiazide 12.5 MG tablet Commonly known as: HYDRODIURIL   ibuprofen 600 MG tablet Commonly known as: ADVIL   losartan 100 MG tablet Commonly known as: COZAAR   potassium chloride SA 20 MEQ tablet Commonly known as: KLOR-CON     TAKE these medications   aspirin 81 MG EC tablet Take 81 mg by mouth daily.   baclofen 10 MG tablet Commonly known as: LIORESAL Take 5-10 mg by mouth every 8 (eight) hours as needed for muscle spasms.   Brilinta 90 MG Tabs tablet Generic drug: ticagrelor Take 90 mg by mouth 2 (two) times daily.   loperamide 2 MG tablet Commonly known as: Imodium A-D Take 1 tablet (2 mg total) by mouth 4 (four) times daily as needed for diarrhea or loose stools.   loratadine 10 MG tablet Commonly known as: CLARITIN Take 1 tablet (10 mg total) by mouth daily. Start taking on: January 19, 2020   omeprazole 40 MG capsule Commonly known as: PRILOSEC Take 40 mg by mouth daily.   ondansetron 4 MG disintegrating tablet Commonly known as: Zofran ODT Take 1 tablet (4 mg total) by mouth every 8 (eight) hours as needed for nausea.   pravastatin 40 MG tablet Commonly known as: PRAVACHOL Take 40 mg by mouth daily.   promethazine 25 MG tablet Commonly known as: PHENERGAN Take 1 tablet (25 mg total) by  mouth every 6 (six) hours as needed for nausea.      Allergies  Allergen Reactions  . Codeine   . Darvocet [Propoxyphene N-Acetaminophen]   . Lisinopril Other (See Comments)  . Morphine And Related Nausea And Vomiting  . Amoxicillin-Pot Clavulanate Rash    Other reaction(s): RASH Other reaction(s): RASH   . Penicillins Rash      The results of significant diagnostics from this hospitalization (including imaging, microbiology, ancillary and laboratory) are listed below for reference.    Significant Diagnostic Studies: CT ABDOMEN PELVIS W CONTRAST  Result Date: 01/09/2020 CLINICAL DATA:   Sudden onset abdominal pain EXAM: CT ABDOMEN AND PELVIS WITH CONTRAST TECHNIQUE: Multidetector CT imaging of the abdomen and pelvis was performed using the standard protocol following bolus administration of intravenous contrast. CONTRAST:  19mL OMNIPAQUE IOHEXOL 300 MG/ML  SOLN COMPARISON:  CT abdomen pelvis 03/29/2013 (report only) FINDINGS: Lower chest: Lung bases are clear. Normal heart size. No pericardial effusion. Hepatobiliary: No focal liver abnormality is seen. No gallstones, gallbladder wall thickening, or biliary dilatation. Pancreas: Unremarkable. No pancreatic ductal dilatation or surrounding inflammatory changes. Spleen: Normal in size without focal abnormality. Adrenals/Urinary Tract: Adrenal glands are unremarkable. Kidneys are normal, without renal calculi, focal lesion, or hydronephrosis. Bladder is unremarkable. Stomach/Bowel: Distal esophagus, stomach and duodenal sweep are unremarkable. No small bowel wall thickening or dilatation. No evidence of obstruction. There is circumferential thickening of the ascending colon with more focal phlegmonous change which appears to be centered on several right-sided colonic diverticula (2/45, 5/33, 6/33). No extraluminal gas or fluid collection is seen. Minimal thickening at the appendiceal base with a more normal appearing appendiceal tip favored to be reactive to the more focal colonic process. Distal colon is unremarkable. Vascular/Lymphatic: Atherosclerotic plaque within the normal caliber abdominal aorta and branch vessels. Few reactive lymph nodes in the right lower quadrant. No pathologically enlarged nodes in the abdomen or pelvis. Reproductive: Normal appearance of the uterus and adnexal structures. Other: No abdominopelvic free fluid or free gas. No bowel containing hernias. Musculoskeletal: Mild grade 1 anterolisthesis L4 on 5 without pars defects. Minimal discogenic and facet degenerative change maximal L5-S1. No acute osseous abnormality or  suspicious osseous lesion. IMPRESSION: 1. Circumferential thickening of the ascending colon with more focal phlegmonous change which appears to be centered on several right-sided colonic diverticula. No extraluminal gas or fluid collection is seen to suggest perforation. Findings are most suggestive of acute diverticulitis. 2. Minimal thickening at the appendiceal base with a more normal appearing appendiceal tip favored to be reactive to the above process. 3. Mild grade 1 anterolisthesis L4 on 5 without pars defects. 4. Aortic Atherosclerosis (ICD10-I70.0). Electronically Signed   By: Lovena Le M.D.   On: 01/09/2020 22:01    Microbiology: Recent Results (from the past 240 hour(s))  Urine culture     Status: None   Collection Time: 01/09/20 10:45 PM   Specimen: Urine, Clean Catch  Result Value Ref Range Status   Specimen Description   Final    URINE, CLEAN CATCH Performed at [facility], [address] [place], [place] [address]    Special Requests   Final    NONE Performed at [facility], [address] [place], [place] [address]    Culture   Final    NO GROWTH Performed at [facility], [place] [address], [place], [place] [address]    Report Status 01/11/2020 FINAL  Final  SARS CORONAVIRUS 2 (TAT  6-24 HRS) Nasopharyngeal Nasopharyngeal Swab     Status: None   Collection Time: 01/09/20 11:02 PM   Specimen: Nasopharyngeal Swab  Result Value Ref Range Status   SARS Coronavirus 2 NEGATIVE NEGATIVE Final    Comment: (NOTE) SARS-CoV-2 target nucleic acids are NOT DETECTED. The SARS-CoV-2 RNA is generally detectable in upper and lower respiratory specimens during the acute phase of infection. Negative results do not preclude SARS-CoV-2 infection, do not rule out co-infections with other pathogens, and should not be used as the sole basis for treatment or other patient management decisions. Negative results must be combined with clinical  observations, patient history, and epidemiological information. The expected result is Negative. Fact Sheet for Patients: SugarRoll.be Fact Sheet for Healthcare Providers: https://www.-mathews.com/ This test is not yet approved or cleared by the [place] FDA and  has been authorized for detection and/or diagnosis of SARS-CoV-2 by FDA under an Emergency Use Authorization (EUA). This EUA will remain  in effect (meaning this test can be used) for the duration of the COVID-19 declaration under Section 56 4(b)(1) of the Act, 21 U.S.C. section 360bbb-3(b)(1), unless the authorization is terminated or revoked sooner. Performed at [facility], [place] [address] [place], [place] [address]      Labs: Basic Metabolic Panel: Recent Labs  Lab 01/14/20 0216 01/14/20 0216 01/14/20 1202 01/15/20 0323 01/16/20 0233 01/17/20 0547 01/18/20 0138  NA 140  --   --  142 142 143 141  K 4.4  --   --  3.7 3.7 4.0 3.9  CL 105  --   --  106 107 107 110  CO2 25  --   --  26 26 26 24   GLUCOSE 105*  --   --  99 98 90 91  BUN 5*  --   --  <5* <5* <5* 5*  CREATININE 0.97  --   --  0.90 0.88 0.84 0.90  CALCIUM 9.1  --   --  8.7* 8.7* 9.0 8.6*  MG 2.0   < > 1.6* 2.1 1.7 1.5* 2.0  PHOS  --   --  3.6 3.9 3.8 4.0 4.1   < > = values in this interval not displayed.   Liver Function Tests: Recent Labs  Lab 01/15/20 0323 01/16/20 0233 01/17/20 0547 01/18/20 0138  AST 18 17 21 20   ALT 12 13 15 15   ALKPHOS 47 44 47 44  BILITOT 0.5 0.4 0.3 0.5  PROT 5.6* 5.4* 5.7* 5.7*  ALBUMIN 2.9* 2.8* 2.9* 3.0*   No results for input(s): LIPASE, AMYLASE in the last 168 hours. No results for input(s): AMMONIA in the last 168 hours. CBC: Recent Labs  Lab 01/12/20 0215 01/12/20 0215 01/13/20 [id number] 01/13/20 0307 01/14/20 0216 01/15/20 0323 01/16/20 0233 01/17/20 0547 01/18/20 0138  WBC 10.7*   < > 6.6   < > 5.3 5.0 5.2 5.1 5.7  NEUTROABS 8.4*  --  4.3  --   2.9  --   --   --   --   HGB 7.6*   < > 8.4*   < > 8.3* 7.7* 7.6* 7.9* 8.0*  HCT 23.5*   < > 26.9*   < > 26.4* 24.7* 23.9* 25.2* 25.2*  MCV 84.5   < > 85.9   < > 85.7 85.2 84.5 84.6 84.0  PLT 313   < > 369   < > 427* 411* 404* 464* 508*   < > = values in this interval not displayed.  Cardiac Enzymes: No results for input(s): CKTOTAL, CKMB, CKMBINDEX, TROPONINI in the last 168 hours. BNP: BNP (last 3 results) No results for input(s): BNP in the last 8760 hours.  ProBNP (last 3 results) No results for input(s): PROBNP in the last 8760 hours.  CBG: No results for input(s): GLUCAP in the last 168 hours.     Signed:  Dia Crawford, MD [facility] [phone] pager

## 2020-01-23 ENCOUNTER — Encounter (HOSPITAL_BASED_OUTPATIENT_CLINIC_OR_DEPARTMENT_OTHER): Payer: Self-pay | Admitting: Emergency Medicine

## 2020-01-23 ENCOUNTER — Other Ambulatory Visit: Payer: Self-pay

## 2020-01-23 ENCOUNTER — Inpatient Hospital Stay (HOSPITAL_BASED_OUTPATIENT_CLINIC_OR_DEPARTMENT_OTHER)
Admission: EM | Admit: 2020-01-23 | Discharge: 2020-01-29 | DRG: 393 | Disposition: A | Discharge status: Home / Self Care | Payer: BC Managed Care – PPO | Attending: Internal Medicine | Admitting: Internal Medicine

## 2020-01-23 ENCOUNTER — Emergency Department (HOSPITAL_BASED_OUTPATIENT_CLINIC_OR_DEPARTMENT_OTHER): Payer: BC Managed Care – PPO

## 2020-01-23 DIAGNOSIS — E871 Hypo-osmolality and hyponatremia: Secondary | ICD-10-CM | POA: Diagnosis present

## 2020-01-23 DIAGNOSIS — L732 Hidradenitis suppurativa: Secondary | ICD-10-CM | POA: Diagnosis present

## 2020-01-23 DIAGNOSIS — K219 Gastro-esophageal reflux disease without esophagitis: Secondary | ICD-10-CM | POA: Diagnosis present

## 2020-01-23 DIAGNOSIS — E785 Hyperlipidemia, unspecified: Secondary | ICD-10-CM | POA: Diagnosis present

## 2020-01-23 DIAGNOSIS — D509 Iron deficiency anemia, unspecified: Secondary | ICD-10-CM | POA: Diagnosis present

## 2020-01-23 DIAGNOSIS — I951 Orthostatic hypotension: Secondary | ICD-10-CM | POA: Diagnosis present

## 2020-01-23 DIAGNOSIS — Y92008 Other place in unspecified non-institutional (private) residence as the place of occurrence of the external cause: Secondary | ICD-10-CM

## 2020-01-23 DIAGNOSIS — E7800 Pure hypercholesterolemia, unspecified: Secondary | ICD-10-CM | POA: Diagnosis present

## 2020-01-23 DIAGNOSIS — K922 Gastrointestinal hemorrhage, unspecified: Secondary | ICD-10-CM

## 2020-01-23 DIAGNOSIS — D62 Acute posthemorrhagic anemia: Secondary | ICD-10-CM | POA: Diagnosis present

## 2020-01-23 DIAGNOSIS — Z885 Allergy status to narcotic agent status: Secondary | ICD-10-CM

## 2020-01-23 DIAGNOSIS — R42 Dizziness and giddiness: Secondary | ICD-10-CM | POA: Diagnosis not present

## 2020-01-23 DIAGNOSIS — K921 Melena: Secondary | ICD-10-CM | POA: Diagnosis present

## 2020-01-23 DIAGNOSIS — K633 Ulcer of intestine: Principal | ICD-10-CM | POA: Diagnosis present

## 2020-01-23 DIAGNOSIS — K5790 Diverticulosis of intestine, part unspecified, without perforation or abscess without bleeding: Secondary | ICD-10-CM

## 2020-01-23 DIAGNOSIS — E041 Nontoxic single thyroid nodule: Secondary | ICD-10-CM | POA: Diagnosis present

## 2020-01-23 DIAGNOSIS — Z881 Allergy status to other antibiotic agents status: Secondary | ICD-10-CM

## 2020-01-23 DIAGNOSIS — I671 Cerebral aneurysm, nonruptured: Secondary | ICD-10-CM | POA: Diagnosis present

## 2020-01-23 DIAGNOSIS — Z880 Allergy status to penicillin: Secondary | ICD-10-CM

## 2020-01-23 DIAGNOSIS — Z86010 Personal history of colonic polyps: Secondary | ICD-10-CM

## 2020-01-23 DIAGNOSIS — Z7982 Long term (current) use of aspirin: Secondary | ICD-10-CM

## 2020-01-23 DIAGNOSIS — K529 Noninfective gastroenteritis and colitis, unspecified: Secondary | ICD-10-CM | POA: Diagnosis present

## 2020-01-23 DIAGNOSIS — Z9851 Tubal ligation status: Secondary | ICD-10-CM

## 2020-01-23 DIAGNOSIS — R55 Syncope and collapse: Secondary | ICD-10-CM

## 2020-01-23 DIAGNOSIS — W1839XA Other fall on same level, initial encounter: Secondary | ICD-10-CM | POA: Diagnosis present

## 2020-01-23 DIAGNOSIS — Z79899 Other long term (current) drug therapy: Secondary | ICD-10-CM

## 2020-01-23 DIAGNOSIS — Z20822 Contact with and (suspected) exposure to covid-19: Secondary | ICD-10-CM | POA: Diagnosis present

## 2020-01-23 DIAGNOSIS — K5731 Diverticulosis of large intestine without perforation or abscess with bleeding: Secondary | ICD-10-CM | POA: Diagnosis present

## 2020-01-23 DIAGNOSIS — I10 Essential (primary) hypertension: Secondary | ICD-10-CM | POA: Diagnosis present

## 2020-01-23 DIAGNOSIS — Z87891 Personal history of nicotine dependence: Secondary | ICD-10-CM

## 2020-01-23 DIAGNOSIS — S0083XA Contusion of other part of head, initial encounter: Secondary | ICD-10-CM | POA: Diagnosis present

## 2020-01-23 LAB — CBC WITH DIFFERENTIAL/PLATELET
Abs Immature Granulocytes: 0.01 10*3/uL (ref 0.00–0.07)
Basophils Absolute: 0 10*3/uL (ref 0.0–0.1)
Basophils Relative: 1 %
Eosinophils Absolute: 0.1 10*3/uL (ref 0.0–0.5)
Eosinophils Relative: 2 %
HCT: 25.2 % — ABNORMAL LOW (ref 36.0–46.0)
Hemoglobin: 8 g/dL — ABNORMAL LOW (ref 12.0–15.0)
Immature Granulocytes: 0 %
Lymphocytes Relative: 27 %
Lymphs Abs: 1.2 10*3/uL (ref 0.7–4.0)
MCH: 26.7 pg (ref 26.0–34.0)
MCHC: 31.7 g/dL (ref 30.0–36.0)
MCV: 84 fL (ref 80.0–100.0)
Monocytes Absolute: 0.4 10*3/uL (ref 0.1–1.0)
Monocytes Relative: 10 %
Neutro Abs: 2.6 10*3/uL (ref 1.7–7.7)
Neutrophils Relative %: 60 %
Platelets: 525 10*3/uL — ABNORMAL HIGH (ref 150–400)
RBC: 3 MIL/uL — ABNORMAL LOW (ref 3.87–5.11)
RDW: 15.3 % (ref 11.5–15.5)
WBC: 4.3 10*3/uL (ref 4.0–10.5)
nRBC: 0 % (ref 0.0–0.2)

## 2020-01-23 LAB — BASIC METABOLIC PANEL
Anion gap: 10 (ref 5–15)
BUN: 13 mg/dL (ref 8–23)
CO2: 23 mmol/L (ref 22–32)
Calcium: 9.1 mg/dL (ref 8.9–10.3)
Chloride: 99 mmol/L (ref 98–111)
Creatinine, Ser: 1.13 mg/dL — ABNORMAL HIGH (ref 0.44–1.00)
GFR calc Af Amer: 59 mL/min — ABNORMAL LOW (ref >60)
GFR calc non Af Amer: 51 mL/min — ABNORMAL LOW (ref >60)
Glucose, Bld: 105 mg/dL — ABNORMAL HIGH (ref 70–99)
Potassium: 4 mmol/L (ref 3.5–5.1)
Sodium: 132 mmol/L — ABNORMAL LOW (ref 135–145)

## 2020-01-23 LAB — OCCULT BLOOD X 1 CARD TO LAB, STOOL: Fecal Occult Bld: POSITIVE — AB

## 2020-01-23 NOTE — ED Notes (Signed)
Pt ambulatory with steady gait to restroom 

## 2020-01-23 NOTE — ED Notes (Signed)
Unsuccessful IV attempt. Sonia Baller, RT to make attempt.

## 2020-01-23 NOTE — ED Notes (Signed)
This RN at bedside to complete COVID swab for admission - pt states she didn't know she was being admitted and wanted to go home. EDP brought to bedside to discuss admission.

## 2020-01-23 NOTE — ED Triage Notes (Signed)
Reports one large bloody stool 15 min pta.  Recently admitted with diverticulitis.  Reports she passed out last night and was seen by ems.  They gave her fluid and she felt better so she stayed at home.  Also reports being on Brillinta for 2 brain aneurysms as well as aspirin 81mg .

## 2020-01-23 NOTE — ED Provider Notes (Signed)
[facility] DEPART[facility] Provider Note   CSN: [id number] Arrival date & time: 01/23/20  2033     History Chief Complaint  Patient presents with  . GI Bleeding    Nancy Tran is a 64 y.o. female.  Patient is a 64 year old female who presents with rectal bleeding.  She was recently admitted for diverticulitis.  She was discharged on March 23.  At that time she had had a drop in her hemoglobin while she was in the hospital.  On admission it was 9.2.  It had dropped down to 7.6 and was 8.0 on discharge.  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  She is also on aspirin.  Last night she had a syncopal episode.  She went down on the porch because she was feeling dizzy and lightheaded and felt like she might pass out.  She went outside to get some air and syncopized.  She did fall and hit her head and has a small hematoma on her forehead.  EMS did evaluate her and she was noted to be orthostatic with a standing blood pressure of 82 systolic.  She was given IV fluids and was feeling better.  At that time she reportedly did not want to be transported.  She did note some blood in her stool this morning when she was wiping and some in the toilet as well.  She did not have any more bowel movements until this evening when she had a large bloody bowel movement in the toilet.  She said it was fairly large.  She denies any dizziness today.  No chest pain or shortness of breath.  No increased fatigue.  She denies any other injuries from the fall last night.  No neck or back pain.  She denies any abdominal pain.        Past Medical History:  Diagnosis Date  . Complication of anesthesia   . Diverticulitis   . High cholesterol   . High cholesterol   . History of cerebral aneurysm   . Hypertension   . PONV (postoperative nausea and vomiting)   . Tubal ligation status     Patient Active Problem List   Diagnosis Date Noted  . Lower  GI bleed 01/24/2020  . Hypokalemia 01/10/2020  . Occult GI bleeding 01/10/2020  . AKI (acute kidney injury) ([place]) 01/10/2020  . Hypomagnesemia 01/10/2020  . Acute diverticulitis 01/09/2020  . Cerebral aneurysm 12/09/2019  . Family history of brain aneurysm 11/07/2019  . GAD (generalized anxiety disorder) 10/28/2019  . Tension-type headache, not intractable 10/28/2019  . Mild renal insufficiency 10/14/2019  . Leg swelling 07/20/2019  . Normochromic anemia 04/12/2019  . Gastritis determined by endoscopy 11/02/2018  . Cervical cancer screening 10/12/2018  . Neutropenia ([place]) 07/28/2018  . History of colonic polyps 06/24/2018  . Odynophagia 06/10/2018  . Thyroid nodule 06/10/2018  . Prediabetes 05/26/2015  . Osteoarthritis of knee 11/07/2014  . Essential hypertension 09/03/2014  . Hyperlipidemia LDL goal <130 10/26/2013  . Hidradenitis 06/10/2012  . Tubal ligation status     Past Surgical History:  Procedure Laterality Date  . BREAST SURGERY    . CEREBRAL ANEURYSM REPAIR  12/29/2019  . COLONOSCOPY    . TUBAL LIGATION       OB History   No obstetric history on file.     No family history on file.  Social History   Tobacco Use  . Smoking status: Former Research scientist (life sciences)  .  Smokeless tobacco: Never Used  . Tobacco comment: QUIT IN 1992  Substance Use Topics  . Alcohol use: Yes  . Drug use: No    Home Medications Prior to Admission medications   Medication Sig Start Date End Date Taking? Authorizing Provider  aspirin 81 MG EC tablet Take 81 mg by mouth daily. 12/22/19  Yes [provider]  baclofen (LIORESAL) 10 MG tablet Take 5-10 mg by mouth every 8 (eight) hours as needed for muscle spasms. 11/10/19  Yes [provider]  BRILINTA 90 MG TABS tablet Take 90 mg by mouth 2 (two) times daily. 12/10/19  Yes [provider]  loratadine (CLARITIN) 10 MG tablet Take 1 tablet (10 mg total) by mouth daily. 01/19/20  Yes [name], MD  omeprazole  (PRILOSEC) 40 MG capsule Take 40 mg by mouth daily. 12/20/19  Yes [provider]  pravastatin (PRAVACHOL) 40 MG tablet Take 40 mg by mouth daily. 12/25/19  Yes [provider]  loperamide (IMODIUM A-D) 2 MG tablet Take 1 tablet (2 mg total) by mouth 4 (four) times daily as needed for diarrhea or loose stools. Patient not taking: Reported on 01/10/2020 03/28/13   Fredia Sorrow, MD  ondansetron (ZOFRAN ODT) 4 MG disintegrating tablet Take 1 tablet (4 mg total) by mouth every 8 (eight) hours as needed for nausea. Patient not taking: Reported on 01/10/2020 03/29/13   Fredia Sorrow, MD  promethazine (PHENERGAN) 25 MG tablet Take 1 tablet (25 mg total) by mouth every 6 (six) hours as needed for nausea. Patient not taking: Reported on 01/10/2020 03/29/13   Fredia Sorrow, MD    Allergies    Codeine, Darvocet [propoxyphene n-acetaminophen], Lisinopril, Morphine and related, Amoxicillin-pot clavulanate, and Penicillins  Review of Systems   Review of Systems  Constitutional: Negative for chills, diaphoresis, fatigue and fever.  HENT: Negative for congestion, rhinorrhea and sneezing.   Eyes: Negative.   Respiratory: Negative for cough, chest tightness and shortness of breath.   Cardiovascular: Negative for chest pain and leg swelling.  Gastrointestinal: Positive for blood in stool. Negative for abdominal pain, diarrhea, nausea and vomiting.  Genitourinary: Negative for difficulty urinating, flank pain, frequency and hematuria.  Musculoskeletal: Negative for arthralgias and back pain.  Skin: Negative for rash.  Neurological: Positive for syncope. Negative for dizziness, speech difficulty, weakness, numbness and headaches.    Physical Exam Updated Vital Signs BP 125/66 (BP Location: Right Arm)   Pulse 78   Temp 97.8 F (36.6 C) (Oral)   Resp 14   Ht 5\' 7"  (1.702 m)   Wt 84.4 kg   SpO2 100%   BMI 29.13 kg/m   Physical Exam Constitutional:      Appearance: She is  well-developed.  HENT:     Head: Normocephalic.     Comments: Small hematoma to the right forehead.  No swelling to the face.  No facial tenderness. Eyes:     Pupils: Pupils are equal, round, and reactive to light.     Comments: Tiny superficial abrasion over her right eyelid.  No evident damage to the eye itself.  No erythema.  No discharge.  No increased tearing.  Cardiovascular:     Rate and Rhythm: Normal rate and regular rhythm.     Heart sounds: Normal heart sounds.  Pulmonary:     Effort: Pulmonary effort is normal. No respiratory distress.     Breath sounds: Normal breath sounds. No wheezing or rales.  Chest:     Chest wall: No tenderness.  Abdominal:     General: Bowel sounds are normal.     Palpations: Abdomen is soft.     Tenderness: There is no abdominal tenderness. There is no guarding or rebound.  Genitourinary:    Comments: Positive bright red blood noted on rectal exam Musculoskeletal:        General: No tenderness. Normal range of motion.     Cervical back: Normal range of motion and neck supple.  Lymphadenopathy:     Cervical: No cervical adenopathy.  Skin:    General: Skin is warm and dry.     Findings: No rash.  Neurological:     Mental Status: She is alert and oriented to person, place, and time.     ED Results / Procedures / Treatments   Labs (all labs ordered are listed, but only abnormal results are displayed) Labs Reviewed  BASIC METABOLIC PANEL - Abnormal; Notable for the following components:      Result Value   Sodium 132 (*)    Glucose, Bld 105 (*)    Creatinine, Ser 1.13 (*)    GFR calc non Af Amer 51 (*)    GFR calc Af Amer 59 (*)    All other components within normal limits  CBC WITH DIFFERENTIAL/PLATELET - Abnormal; Notable for the following components:   RBC 3.00 (*)    Hemoglobin 8.0 (*)    HCT 25.2 (*)    Platelets 525 (*)    All other components within normal limits  OCCULT BLOOD X 1 CARD TO LAB, STOOL - Abnormal; Notable for  the following components:   Fecal Occult Bld POSITIVE (*)    All other components within normal limits  SARS CORONAVIRUS 2 (TAT 6-24 HRS)    EKG EKG Interpretation  Date/Time:  Sunday January 23 2020 21:32:23 EDT Ventricular Rate:  78 PR Interval:    QRS Duration: 88 QT Interval:  383 QTC Calculation: 437 R Axis:   29 Text Interpretation: Sinus rhythm Atrial premature complex No old tracing to compare Confirmed by Malvin Johns [id number]) on 01/23/2020 9:39:25 PM   Radiology CT Head Wo Contrast  Result Date: 01/23/2020 CLINICAL DATA:  [place], syncope, bloody stool EXAM: CT HEAD WITHOUT CONTRAST TECHNIQUE: Contiguous axial images were obtained from the base of the skull through the vertex without intravenous contrast. COMPARISON:  11/26/2019 FINDINGS: Brain: No acute infarct or hemorrhage. Lateral ventricles and midline structures are unremarkable. No acute extra-axial fluid collections. No mass effect. Vascular: No hyperdense vessel or unexpected calcification. Skull: Normal. Negative for fracture or focal lesion. Sinuses/Orbits: No acute finding. Other: None IMPRESSION: 1. Stable head CT, no acute intracranial process. Electronically Signed   By: Randa Ngo M.D.   On: 01/23/2020 22:34    Procedures Procedures (including critical care time)  Medications Ordered in ED Medications - No data to display  ED Course  I have reviewed the triage vital signs and the nursing notes.  Pertinent labs & imaging results that were available during my care of the patient were reviewed by me and considered in my medical decision making (see chart for details).    MDM Rules/Calculators/A&P                      Patient is a 64 year old female who presents with GI bleeding.  Her vital signs are stable.  Her hemoglobin is 8 which is similar to her prior values.  Given her recent fall/syncope I did do a head CT which shows no acute  abnormalities.  Her EKG does not show any arrhythmias or ischemia.  Her  labs are unremarkable other than her hemoglobin which is low.  She has had no further bleeding in the ED although she did have gross blood on my rectal exam.  I spoke with Dr.Rathore who has accepted the patient for [facility].  Given her cerebral artery stent, I will start her on her regular medicines including the Brilinta pending transfer.   Final Clinical Impression(s) / ED Diagnoses Final diagnoses:  Gastrointestinal hemorrhage, unspecified gastrointestinal hemorrhage type    Rx / DC Orders ED Discharge Orders    None       Malvin Johns, MD 01/24/20 0028

## 2020-01-24 ENCOUNTER — Encounter (HOSPITAL_COMMUNITY): Payer: Self-pay | Admitting: Internal Medicine

## 2020-01-24 DIAGNOSIS — S0083XA Contusion of other part of head, initial encounter: Secondary | ICD-10-CM | POA: Diagnosis present

## 2020-01-24 DIAGNOSIS — K219 Gastro-esophageal reflux disease without esophagitis: Secondary | ICD-10-CM | POA: Diagnosis present

## 2020-01-24 DIAGNOSIS — K921 Melena: Secondary | ICD-10-CM | POA: Diagnosis not present

## 2020-01-24 DIAGNOSIS — D62 Acute posthemorrhagic anemia: Secondary | ICD-10-CM | POA: Diagnosis present

## 2020-01-24 DIAGNOSIS — D509 Iron deficiency anemia, unspecified: Secondary | ICD-10-CM | POA: Diagnosis present

## 2020-01-24 DIAGNOSIS — K5790 Diverticulosis of intestine, part unspecified, without perforation or abscess without bleeding: Secondary | ICD-10-CM | POA: Diagnosis not present

## 2020-01-24 DIAGNOSIS — Z7982 Long term (current) use of aspirin: Secondary | ICD-10-CM | POA: Diagnosis not present

## 2020-01-24 DIAGNOSIS — Z885 Allergy status to narcotic agent status: Secondary | ICD-10-CM | POA: Diagnosis not present

## 2020-01-24 DIAGNOSIS — Y92008 Other place in unspecified non-institutional (private) residence as the place of occurrence of the external cause: Secondary | ICD-10-CM | POA: Diagnosis not present

## 2020-01-24 DIAGNOSIS — R55 Syncope and collapse: Secondary | ICD-10-CM

## 2020-01-24 DIAGNOSIS — K5733 Diverticulitis of large intestine without perforation or abscess with bleeding: Secondary | ICD-10-CM | POA: Diagnosis not present

## 2020-01-24 DIAGNOSIS — I951 Orthostatic hypotension: Secondary | ICD-10-CM | POA: Diagnosis present

## 2020-01-24 DIAGNOSIS — E871 Hypo-osmolality and hyponatremia: Secondary | ICD-10-CM | POA: Diagnosis present

## 2020-01-24 DIAGNOSIS — Z881 Allergy status to other antibiotic agents status: Secondary | ICD-10-CM | POA: Diagnosis not present

## 2020-01-24 DIAGNOSIS — E785 Hyperlipidemia, unspecified: Secondary | ICD-10-CM | POA: Diagnosis present

## 2020-01-24 DIAGNOSIS — Z9851 Tubal ligation status: Secondary | ICD-10-CM | POA: Diagnosis not present

## 2020-01-24 DIAGNOSIS — E041 Nontoxic single thyroid nodule: Secondary | ICD-10-CM | POA: Diagnosis present

## 2020-01-24 DIAGNOSIS — K529 Noninfective gastroenteritis and colitis, unspecified: Secondary | ICD-10-CM | POA: Diagnosis present

## 2020-01-24 DIAGNOSIS — D490 Neoplasm of unspecified behavior of digestive system: Secondary | ICD-10-CM | POA: Diagnosis not present

## 2020-01-24 DIAGNOSIS — L732 Hidradenitis suppurativa: Secondary | ICD-10-CM | POA: Diagnosis present

## 2020-01-24 DIAGNOSIS — Z87891 Personal history of nicotine dependence: Secondary | ICD-10-CM | POA: Diagnosis not present

## 2020-01-24 DIAGNOSIS — I671 Cerebral aneurysm, nonruptured: Secondary | ICD-10-CM | POA: Diagnosis not present

## 2020-01-24 DIAGNOSIS — R42 Dizziness and giddiness: Secondary | ICD-10-CM | POA: Diagnosis not present

## 2020-01-24 DIAGNOSIS — K922 Gastrointestinal hemorrhage, unspecified: Secondary | ICD-10-CM

## 2020-01-24 DIAGNOSIS — Z880 Allergy status to penicillin: Secondary | ICD-10-CM | POA: Diagnosis not present

## 2020-01-24 DIAGNOSIS — R933 Abnormal findings on diagnostic imaging of other parts of digestive tract: Secondary | ICD-10-CM | POA: Diagnosis not present

## 2020-01-24 DIAGNOSIS — I10 Essential (primary) hypertension: Secondary | ICD-10-CM | POA: Diagnosis present

## 2020-01-24 DIAGNOSIS — K5731 Diverticulosis of large intestine without perforation or abscess with bleeding: Secondary | ICD-10-CM | POA: Diagnosis present

## 2020-01-24 DIAGNOSIS — Z79899 Other long term (current) drug therapy: Secondary | ICD-10-CM | POA: Diagnosis not present

## 2020-01-24 DIAGNOSIS — E7800 Pure hypercholesterolemia, unspecified: Secondary | ICD-10-CM | POA: Diagnosis present

## 2020-01-24 DIAGNOSIS — K633 Ulcer of intestine: Secondary | ICD-10-CM | POA: Diagnosis present

## 2020-01-24 DIAGNOSIS — Z20822 Contact with and (suspected) exposure to covid-19: Secondary | ICD-10-CM | POA: Diagnosis present

## 2020-01-24 DIAGNOSIS — W1839XA Other fall on same level, initial encounter: Secondary | ICD-10-CM | POA: Diagnosis present

## 2020-01-24 LAB — HEMOGLOBIN AND HEMATOCRIT, BLOOD
HCT: 25 % — ABNORMAL LOW (ref 36.0–46.0)
HCT: 25 % — ABNORMAL LOW (ref 36.0–46.0)
HCT: 26.3 % — ABNORMAL LOW (ref 36.0–46.0)
HCT: 23.2 % — ABNORMAL LOW (ref 36.0–46.0)
Hemoglobin: 8 g/dL — ABNORMAL LOW (ref 12.0–15.0)
Hemoglobin: 7.6 g/dL — ABNORMAL LOW (ref 12.0–15.0)
Hemoglobin: 8 g/dL — ABNORMAL LOW (ref 12.0–15.0)
Hemoglobin: 7.3 g/dL — ABNORMAL LOW (ref 12.0–15.0)

## 2020-01-24 LAB — SARS CORONAVIRUS 2 (TAT 6-24 HRS): SARS Coronavirus 2: NEGATIVE

## 2020-01-24 LAB — ABO/RH: ABO/RH(D): O POS

## 2020-01-24 LAB — TROPONIN I (HIGH SENSITIVITY): Troponin I (High Sensitivity): 4 ng/L (ref <18)

## 2020-01-24 MED ORDER — PRAVASTATIN SODIUM 40 MG PO TABS
40.0000 mg | ORAL_TABLET | Freq: Every day | ORAL | Status: DC
  Administered 2020-01-24 – 2020-01-29 (×6): 40 mg via ORAL
  Filled 2020-01-24 (×7): qty 1

## 2020-01-24 MED ORDER — TICAGRELOR 90 MG PO TABS
90.0000 mg | ORAL_TABLET | Freq: Two times a day (BID) | ORAL | Status: DC
  Administered 2020-01-24 – 2020-01-25 (×4): 90 mg via ORAL
  Filled 2020-01-24 (×4): qty 1

## 2020-01-24 MED ORDER — PANTOPRAZOLE SODIUM 40 MG PO TBEC
40.0000 mg | DELAYED_RELEASE_TABLET | Freq: Every day | ORAL | Status: DC
  Administered 2020-01-24 – 2020-01-29 (×6): 40 mg via ORAL
  Filled 2020-01-24 (×6): qty 1

## 2020-01-24 MED ORDER — SODIUM CHLORIDE 0.9 % IV SOLN: INTRAVENOUS | Status: AC

## 2020-01-24 MED ORDER — ASPIRIN EC 81 MG PO TBEC
81.0000 mg | DELAYED_RELEASE_TABLET | Freq: Every day | ORAL | Status: DC
  Administered 2020-01-24 – 2020-01-25 (×2): 81 mg via ORAL
  Filled 2020-01-24 (×2): qty 1

## 2020-01-24 MED ORDER — ASPIRIN 81 MG PO TBEC
81.0000 mg | DELAYED_RELEASE_TABLET | Freq: Every day | ORAL | Status: DC
  Filled 2020-01-24: qty 1

## 2020-01-24 NOTE — Progress Notes (Signed)
   01/24/20 0548  Vitals  Temp 97.9 F (36.6 C)  Temp Source Oral  BP 102/68  MAP (mmHg) 80  BP Location Right Arm  BP Method Automatic  Patient Position (if appropriate) Standing  Pulse Rate (!) 113  Pulse Rate Source Monitor  Resp 18  Orthostatic Lying   BP- Lying 109/66  Pulse- Lying 87  Orthostatic Sitting  BP- Sitting 119/74  Pulse- Sitting 98  Orthostatic Standing at 0 minutes  BP- Standing at 0 minutes 102/68  Pulse- Standing at 0 minutes 113  Oxygen Therapy  SpO2 100 %  O2 Device Room Air  MEWS Score  MEWS Temp 0  MEWS Systolic 0  MEWS Pulse 2  MEWS RR 0  MEWS LOC 0  MEWS Score 2  MEWS Score Color Yellow

## 2020-01-24 NOTE — H&P (Signed)
History and Physical    Danyalle Safarian [id number] DOB: 06-22-1956 DOA: 01/23/2020  PCP: Lilian Coma., MD Patient coming from: [place] ED  Chief Complaint: Syncope, hematochezia  HPI: Nancy Tran is a 64 y.o. female with medical history significant of recent hospital admission 01/09/2020-01/18/2020 for acute diverticulitis/lower GI bleed, hypertension, hyperlipidemia, status post recent embolization of left paraclinoid ICA aneurysm at [facility] on 12/09/2019 currently on aspirin and Brilinta presenting to the ED for evaluation of syncope and hematochezia.  It is reported that EMS was called after patient had syncope.  She was orthostatic at that time with standing blood pressure of 82 systolic.  She was given IV fluids but refused transport to the hospital at that time.  Patient states the day she was discharged from the hospital after reaching home she noticed a little bit of blood in her stool when she was wiping.  She went to an urgent care center that day and labs were done which later came back showing a stable blood count.  She was subsequently seen by her neurosurgeon at [facility] on 3/24 and they repeated labs to check her blood count which again she was told it was stable she was advised to continue taking aspirin and Brilinta. States on 3/27 she was standing on her porch and felt lightheaded and weak.  She then remembers waking up and being told that she had passed out.  EMS was called and she was told her blood pressure was very low.  She was given IV fluid and improved.  States yesterday she had one episode of stool with dark red blood but no further episodes since then.  She is no longer feeling lightheaded/dizzy.  Denies abdominal pain.  Denies chest pain or shortness of breath.  ED Course: Afebrile.  Hemodynamically stable.  Labs showing no leukocytosis.  Hemoglobin 8.0, stable since recent hospital discharge.  Noted to have grossly bloody stool on rectal exam.  FOBT  positive.  SARS-CoV-2 PCR test pending. CT head negative for acute intracranial bleed. Patient was given a dose of Brilinta in the ED.  Review of Systems:  All systems reviewed and apart from history of presenting illness, are negative.  Past Medical History:  Diagnosis Date  . Complication of anesthesia   . Diverticulitis   . High cholesterol   . High cholesterol   . History of cerebral aneurysm   . Hypertension   . PONV (postoperative nausea and vomiting)   . Tubal ligation status     Past Surgical History:  Procedure Laterality Date  . BREAST SURGERY    . CEREBRAL ANEURYSM REPAIR  12/29/2019  . COLONOSCOPY    . TUBAL LIGATION       reports that she has quit smoking. She has never used smokeless tobacco. She reports current alcohol use. She reports that she does not use drugs.  Allergies  Allergen Reactions  . Codeine   . Darvocet [Propoxyphene N-Acetaminophen]   . Lisinopril Other (See Comments)  . Morphine And Related Nausea And Vomiting  . Amoxicillin-Pot Clavulanate Rash    Other reaction(s): RASH Other reaction(s): RASH   . Penicillins Rash    History reviewed. No pertinent family history.  Prior to Admission medications   Medication Sig Start Date End Date Taking? Authorizing Provider  aspirin 81 MG EC tablet Take 81 mg by mouth daily. 12/22/19  Yes [provider]  baclofen (LIORESAL) 10 MG tablet Take 5-10 mg by mouth every 8 (eight) hours as needed for  muscle spasms. 11/10/19  Yes [provider]  BRILINTA 90 MG TABS tablet Take 90 mg by mouth 2 (two) times daily. 12/10/19  Yes [provider]  loratadine (CLARITIN) 10 MG tablet Take 1 tablet (10 mg total) by mouth daily. 01/19/20  Yes [name], MD  omeprazole (PRILOSEC) 40 MG capsule Take 40 mg by mouth daily. 12/20/19  Yes [provider]  pravastatin (PRAVACHOL) 40 MG tablet Take 40 mg by mouth daily. 12/25/19  Yes [provider]  loperamide (IMODIUM A-D)  2 MG tablet Take 1 tablet (2 mg total) by mouth 4 (four) times daily as needed for diarrhea or loose stools. Patient not taking: Reported on 01/10/2020 03/28/13   Fredia Sorrow, MD  ondansetron (ZOFRAN ODT) 4 MG disintegrating tablet Take 1 tablet (4 mg total) by mouth every 8 (eight) hours as needed for nausea. Patient not taking: Reported on 01/10/2020 03/29/13   Fredia Sorrow, MD  promethazine (PHENERGAN) 25 MG tablet Take 1 tablet (25 mg total) by mouth every 6 (six) hours as needed for nausea. Patient not taking: Reported on 01/10/2020 03/29/13   Fredia Sorrow, MD    Physical Exam: Vitals:   01/23/20 2306 01/23/20 2315 01/24/20 0000 01/24/20 0255  BP: 125/66   (!) 127/58  Pulse: 83 82 78 89  Resp: 15 18 14 18   Temp:    98.2 F (36.8 C)  TempSrc:    Oral  SpO2: 100% 100% 100% 100%  Weight:    82.1 kg  Height:    5\' 7"  (1.702 m)    Physical Exam  Constitutional: She is oriented to person, place, and time. She appears well-developed and well-nourished. No distress.  HENT:  Head: Normocephalic.  Eyes: Right eye exhibits no discharge. Left eye exhibits no discharge.  Cardiovascular: Normal rate, regular rhythm and intact distal pulses.  Pulmonary/Chest: Effort normal and breath sounds normal. No respiratory distress. She has no wheezes. She has no rales.  Abdominal: Soft. Bowel sounds are normal. She exhibits no distension. There is no abdominal tenderness. There is no guarding.  Musculoskeletal:        General: No edema.     Cervical back: Neck supple.  Neurological: She is alert and oriented to person, place, and time.  Skin: Skin is warm and dry. She is not diaphoretic.     Labs on Admission: I have personally reviewed following labs and imaging studies  CBC: Recent Labs  Lab 01/17/20 0547 01/18/20 0138 01/23/20 2112 01/24/20 0459  WBC 5.1 5.7 4.3  --   NEUTROABS  --   --  2.6  --   HGB 7.9* 8.0* 8.0* 8.0*  HCT 25.2* 25.2* 25.2* 25.0*  MCV 84.6 84.0 84.0  --     PLT 464* 508* 525*  --    Basic Metabolic Panel: Recent Labs  Lab 01/17/20 0547 01/18/20 0138 01/23/20 2112  NA 143 141 132*  K 4.0 3.9 4.0  CL 107 110 99  CO2 26 24 23   GLUCOSE 90 91 105*  BUN <5* 5* 13  CREATININE 0.84 0.90 1.13*  CALCIUM 9.0 8.6* 9.1  MG 1.5* 2.0  --   PHOS 4.0 4.1  --    GFR: Estimated Creatinine Clearance: 55.4 mL/min (A) (by C-G formula based on SCr of 1.13 mg/dL (H)). Liver Function Tests: Recent Labs  Lab 01/17/20 0547 01/18/20 0138  AST 21 20  ALT 15 15  ALKPHOS 47 44  BILITOT 0.3 0.5  PROT 5.7* 5.7*  ALBUMIN  2.9* 3.0*   No results for input(s): LIPASE, AMYLASE in the last 168 hours. No results for input(s): AMMONIA in the last 168 hours. Coagulation Profile: No results for input(s): INR, PROTIME in the last 168 hours. Cardiac Enzymes: No results for input(s): CKTOTAL, CKMB, CKMBINDEX, TROPONINI in the last 168 hours. BNP (last 3 results) No results for input(s): PROBNP in the last 8760 hours. HbA1C: No results for input(s): HGBA1C in the last 72 hours. CBG: No results for input(s): GLUCAP in the last 168 hours. Lipid Profile: No results for input(s): CHOL, HDL, LDLCALC, TRIG, CHOLHDL, LDLDIRECT in the last 72 hours. Thyroid Function Tests: No results for input(s): TSH, T4TOTAL, FREET4, T3FREE, THYROIDAB in the last 72 hours. Anemia Panel: No results for input(s): VITAMINB12, FOLATE, FERRITIN, TIBC, IRON, RETICCTPCT in the last 72 hours. Urine analysis:    Component Value Date/Time   COLORURINE YELLOW 01/09/2020 2245   APPEARANCEUR CLEAR 01/09/2020 2245   LABSPEC 1.020 01/09/2020 2245   PHURINE 5.5 01/09/2020 2245   GLUCOSEU NEGATIVE 01/09/2020 2245   HGBUR NEGATIVE 01/09/2020 2245   BILIRUBINUR NEGATIVE 01/09/2020 2245   KETONESUR NEGATIVE 01/09/2020 2245   PROTEINUR NEGATIVE 01/09/2020 2245   UROBILINOGEN 0.2 03/28/2013 1921   NITRITE NEGATIVE 01/09/2020 2245   LEUKOCYTESUR NEGATIVE 01/09/2020 2245    Radiological Exams  on Admission: CT Head Wo Contrast  Result Date: 01/23/2020 CLINICAL DATA:  [place], syncope, bloody stool EXAM: CT HEAD WITHOUT CONTRAST TECHNIQUE: Contiguous axial images were obtained from the base of the skull through the vertex without intravenous contrast. COMPARISON:  11/26/2019 FINDINGS: Brain: No acute infarct or hemorrhage. Lateral ventricles and midline structures are unremarkable. No acute extra-axial fluid collections. No mass effect. Vascular: No hyperdense vessel or unexpected calcification. Skull: Normal. Negative for fracture or focal lesion. Sinuses/Orbits: No acute finding. Other: None IMPRESSION: 1. Stable head CT, no acute intracranial process. Electronically Signed   By: Randa Ngo M.D.   On: 01/23/2020 22:34    EKG: Independently reviewed.  Sinus rhythm, repolarization abnormality.  No prior tracing for comparison.  Assessment/Plan Principal Problem:   Lower GI bleed Active Problems:   Cerebral aneurysm   Essential hypertension   Syncope   GERD (gastroesophageal reflux disease)   Acute lower GI bleed, suspect diverticular: Recently admitted for acute diverticulitis and diverticular bleed.  CT done on 3/14 revealed several right-sided colonic diverticula and findings suggestive of acute diverticulitis.  She was treated with antibiotics.  Aspirin and Brilinta were continued due to history of recent embolization of left paraclinoid ICA aneurysm at [facility] on 12/09/2019.  Patient had a follow-up visit with neurosurgery on 3/24 and was advised to continue taking aspirin and Plavix. She had one episode of hematochezia yesterday but no recurrence since then.  FOBT positive with grossly bloody stool.  Currently hemodynamically stable.  Hemoglobin checked twice (in the ED and upon arrival to the hospital here) stable since the time of recent hospital discharge.  Monitor closely and order type and screen, serial H&H. Give IV fluid hydration.    History of recent embolization of left  paraclinoid ICA aneurysm at [facility] on 12/09/2019: Continue antiplatelet therapy at this time due to high risk of stent thrombosis.  Continue statin.  Syncope/orthostatic hypotension: She had an episode of syncope on 3/27.  EMS was called and she was found to be orthostatic at that time.  Improved after she was given IV fluid.  EKG without arrhythmia or signs of ischemia.  Continue cardiac monitoring, IV fluid hydration, and repeat  orthostatics in a.m. Check troponin level.  PE less likely given no tachycardia or hypoxia.  Hypertension: Currently normotensive.  Hold antihypertensives at this time.  Mild hyponatremia: Sodium 132.  Give IV fluid hydration and repeat BMP.  GERD: Continue PPI  DVT prophylaxis: SCDs Code Status: Full code Family Communication: No family available at this time. Disposition Plan: Anticipate discharge after resolution of hematochezia/lower GI bleed. Consults called: None Admission status: It is my clinical opinion that admission to INPATIENT is reasonable and necessary because of the expectation that this patient will require hospital care that crosses at least 2 midnights to treat this condition based on the medical complexity of the problems presented.  Given the aforementioned information, the predictability of an adverse outcome is felt to be significant.  The medical decision making on this patient was of high complexity and the patient is at high risk for clinical deterioration, therefore this is a level 3 visit.  Shela Leff MD [facility]  If 7PM-7AM, please contact night-coverage [email or web address]  01/24/2020, 5:43 AM

## 2020-01-24 NOTE — Progress Notes (Signed)
  PROGRESS NOTE  Patient admitted earlier this morning. See H&P.   Nancy Tran is a 64 y.o. female with medical history significant of recent hospital admission 01/09/2020-01/18/2020 for acute diverticulitis/lower GI bleed, hypertension, hyperlipidemia, status post recent embolization of left paraclinoid ICA aneurysm at [facility] on 12/09/2019 currently on aspirin and Brilinta presenting to the ED for evaluation of syncope and hematochezia.  It is reported that EMS was called after patient had syncope.  She was orthostatic at that time with standing blood pressure of 82 systolic.  She was given IV fluids but refused transport to the hospital at that time. She states yesterday she had one episode of stool with dark red blood but no further episodes since then.  In the ED, she was FOBT positive.   On exam today, she is well-appearing without dizziness or lightheadedness. Denies any abdominal pain. We discussed nature of diverticulosis and diverticulitis, GI bleeding, need for aspirin/brilinta, and her increased risk of bleeding.   A/P:   Diverticular bleed with GI blood loss  -Continue to monitor H&H, transfuse for Hgb < 7 -Need to continue aspirin/brilinta for aneurysm stent at this time -Monitor bowel movements  History of recent embolization of left paraclinoid ICA aneurysm at [facility] on 12/09/2019 -Need to continue aspirin/brilinta for aneurysm stent at this time -Continue pravachol   Syncope -Likely related to above as well as antihypertensive use -Check orthostatic VS   HTN -Normotensive, 122/66 at this time   GERD -Continue protonix   Dispo: Monitor H&H and symptoms as well as BP. If stable, plan for discharge home 3/30   [name], DO [facility] 01/24/2020, 9:28 AM  Available via Epic secure chat 7am-7pm After these hours, please refer to coverage provider listed on [email or web address]

## 2020-01-24 NOTE — ED Notes (Signed)
[facility] notified (Tammy) - patient ready for transport 

## 2020-01-24 NOTE — Progress Notes (Signed)
Patient wanted the Right AC PIV removed. This RN attempted to start another PIV. There was blood return but that was all. The PIV would not flush with NS.  The IV team was consulted for PIV start.

## 2020-01-24 NOTE — Progress Notes (Signed)
   01/24/20 0548  Provider Notification  Provider Name/Title E. Ouma NP.  Date Provider Notified 01/24/20  Time Provider Notified [phone]  Notification Type Page  Notification Reason Change in status

## 2020-01-24 NOTE — Progress Notes (Signed)
The patient is requesting a diet. The patient is currently NPO. The PCP on-call was notified.

## 2020-01-24 NOTE — Plan of Care (Signed)
Increasing ambulation etc. every day

## 2020-01-24 NOTE — Progress Notes (Signed)
Patient admitted from [place] was notified.

## 2020-01-25 ENCOUNTER — Inpatient Hospital Stay (HOSPITAL_COMMUNITY): Payer: BC Managed Care – PPO

## 2020-01-25 ENCOUNTER — Encounter (HOSPITAL_COMMUNITY): Payer: Self-pay | Admitting: Internal Medicine

## 2020-01-25 DIAGNOSIS — K922 Gastrointestinal hemorrhage, unspecified: Secondary | ICD-10-CM

## 2020-01-25 DIAGNOSIS — D62 Acute posthemorrhagic anemia: Secondary | ICD-10-CM

## 2020-01-25 HISTORY — PX: IR US GUIDE VASC ACCESS RIGHT: IMG2390

## 2020-01-25 HISTORY — PX: IR ANGIOGRAM VISCERAL SELECTIVE: IMG657

## 2020-01-25 HISTORY — PX: IR ANGIOGRAM SELECTIVE EACH ADDITIONAL VESSEL: IMG667

## 2020-01-25 LAB — CBC
HCT: 22.3 % — ABNORMAL LOW (ref 36.0–46.0)
Hemoglobin: 6.8 g/dL — CL (ref 12.0–15.0)
MCH: 26 pg (ref 26.0–34.0)
MCHC: 30.5 g/dL (ref 30.0–36.0)
MCV: 85.1 fL (ref 80.0–100.0)
Platelets: 477 10*3/uL — ABNORMAL HIGH (ref 150–400)
RBC: 2.62 MIL/uL — ABNORMAL LOW (ref 3.87–5.11)
RDW: 15.1 % (ref 11.5–15.5)
WBC: 3.2 10*3/uL — ABNORMAL LOW (ref 4.0–10.5)
nRBC: 0 % (ref 0.0–0.2)

## 2020-01-25 LAB — BASIC METABOLIC PANEL
Anion gap: 7 (ref 5–15)
BUN: 10 mg/dL (ref 8–23)
CO2: 24 mmol/L (ref 22–32)
Calcium: 8.9 mg/dL (ref 8.9–10.3)
Chloride: 110 mmol/L (ref 98–111)
Creatinine, Ser: 0.86 mg/dL (ref 0.44–1.00)
GFR calc Af Amer: >60 mL/min (ref >60)
GFR calc non Af Amer: >60 mL/min (ref >60)
Glucose, Bld: 98 mg/dL (ref 70–99)
Potassium: 4.2 mmol/L (ref 3.5–5.1)
Sodium: 141 mmol/L (ref 135–145)

## 2020-01-25 LAB — HEMOGLOBIN AND HEMATOCRIT, BLOOD
HCT: 25.7 % — ABNORMAL LOW (ref 36.0–46.0)
Hemoglobin: 8.5 g/dL — ABNORMAL LOW (ref 12.0–15.0)

## 2020-01-25 LAB — PREPARE RBC (CROSSMATCH)

## 2020-01-25 MED ORDER — SODIUM CHLORIDE 0.9% IV SOLUTION: Freq: Once | INTRAVENOUS | Status: DC

## 2020-01-25 MED ORDER — MIDAZOLAM HCL 2 MG/2ML IJ SOLN
INTRAMUSCULAR | Status: AC
  Filled 2020-01-25: qty 4

## 2020-01-25 MED ORDER — IOHEXOL 300 MG/ML  SOLN
100.0000 mL | Freq: Once | INTRAMUSCULAR | Status: AC | PRN
  Administered 2020-01-25: 20 mL via INTRA_ARTERIAL

## 2020-01-25 MED ORDER — FENTANYL CITRATE (PF) 100 MCG/2ML IJ SOLN
25.0000 ug | Freq: Once | INTRAMUSCULAR | Status: AC
  Administered 2020-01-25: 25 ug via INTRAVENOUS
  Filled 2020-01-25: qty 2

## 2020-01-25 MED ORDER — TECHNETIUM TC 99M-LABELED RED BLOOD CELLS IV KIT
22.8000 | PACK | Freq: Once | INTRAVENOUS | Status: AC
  Administered 2020-01-25: 22.8 via INTRAVENOUS

## 2020-01-25 MED ORDER — TICAGRELOR 90 MG PO TABS
90.0000 mg | ORAL_TABLET | Freq: Two times a day (BID) | ORAL | Status: DC
  Administered 2020-01-25 – 2020-01-26 (×3): 90 mg via ORAL
  Filled 2020-01-25 (×5): qty 1

## 2020-01-25 MED ORDER — FENTANYL CITRATE (PF) 100 MCG/2ML IJ SOLN
INTRAMUSCULAR | Status: AC
  Filled 2020-01-25: qty 4

## 2020-01-25 MED ORDER — MIDAZOLAM HCL 2 MG/2ML IJ SOLN
INTRAMUSCULAR | Status: AC | PRN
  Administered 2020-01-25 (×5): 1 mg via INTRAVENOUS

## 2020-01-25 MED ORDER — ONDANSETRON HCL 4 MG/2ML IJ SOLN
INTRAMUSCULAR | Status: AC
  Filled 2020-01-25: qty 2

## 2020-01-25 MED ORDER — LIDOCAINE HCL 1 % IJ SOLN
INTRAMUSCULAR | Status: AC
  Filled 2020-01-25: qty 20

## 2020-01-25 MED ORDER — FENTANYL CITRATE (PF) 100 MCG/2ML IJ SOLN
INTRAMUSCULAR | Status: AC | PRN
  Administered 2020-01-25 (×3): 50 ug via INTRAVENOUS

## 2020-01-25 MED ORDER — IOHEXOL 300 MG/ML  SOLN
100.0000 mL | Freq: Once | INTRAMUSCULAR | Status: AC | PRN
  Administered 2020-01-25: 60 mL via INTRA_ARTERIAL

## 2020-01-25 NOTE — Progress Notes (Signed)
Pt has some questions and doubts regarding blood transfusion. M. Sharlet [place], [place] came to see pt and explained the benefits and risk/side effects of blood transfusion; pt verbalized understanding. Pt agreed and gave consent for blood transfusion.

## 2020-01-25 NOTE — Sedation Documentation (Signed)
6 Fr sheath removed for LEFT femoral artery by Dr. Laurence Ferrari. Hemostasis achieved with angioseal closure device. Groin level 0, 2+ LDP, D+ LPT.

## 2020-01-25 NOTE — Progress Notes (Addendum)
1 unit of PRBC given with [id number]. Pts VSS, no signs of distress noted at this time, verified with Kyla,RN. Will continue to monitor pt

## 2020-01-25 NOTE — Progress Notes (Signed)
CRITICAL VALUE ALERT  Critical Value:  hgb 6.8 and hct 22.3  Date & Time Notied:  3/30 at 0417  Provider Notified: M.denny  Orders Received/Actions taken: waiting for further order

## 2020-01-25 NOTE — Consult Note (Addendum)
[facility] Consult: 12:46 PM 01/25/2020  LOS: 1 day    Referring Provider: Gala Lewandowsky MD  Primary Care Physician:  Lilian Coma., MD Primary Gastroenterologist:  Dr. Mitchell Heir in [facility]    Reason for Consultation:  Hematochezia.     IMPRESSION:   *   Painless hematochezia. Suspect diverticular bleed. Treated for acute diverticulitis, admission 3/14 - 01/18/20.    *    Bilateral paraclinoid internal carotid artery aneurysms.   Started Brilinta, low-dose aspirin in late 10/2019.  S/p 12/29/2019 embolization and flow diverting stent placement to left paraclinoid internal carotid artery aneurysm.    Has not been able to follow-up with her neurosurgeon at [facility] due to the recent admission. Last Brilinta was 1015 this AM, now dc'd.    *     ABL anemia on top of chronic, normocytic anemia.  Interaction with [facility] hematology in 08/2018 Patient completed transfusion with 1 PRBC this morning, posttransfusion CBC pending.   PLAN:     *   Stat nuclear medicine study has been ordered though suspect this will be negative given that her bleeding has slowed down from the initial large volume episode on Sunday night and subsequent 2 smaller episodes yesterday, 1 smaller episode today.  *    For now Brilinta is on hold, probably need to speak with her neuro surgeon, Dr. Bridgette Habermann at [facility] regarding how long is safe to hold this.     Azucena Freed  01/25/2020, 12:46 PM Phone [phone]    [place] Attending   I have taken an interval history, reviewed the chart and examined the patient. I agree with the Advanced Practitioner's note, impression and recommendations.    We suspect colonic diverticular hemorrhage To get Nuc Med scan  Situation more complex than usual due to Brilinta and ASA use  after pipleine internal carotid artery aneurysm embolization recently (early March).  I have placed a call to Dr. Bridgette Habermann at [facility] - he did her procedures.  Will try to come up with a plan re: Brilinta and ASA  Reasonable to hold Brilinta now  Gatha Mayer, MD, [facility] 01/25/2020 3:12 PM    Spoke to Dr. Bridgette Habermann - given the recent intervention to internal carotid artery best to leave on the Brilinta so I will restart  Gatha Mayer, MD, [facility]   HPI: Nancy Tran is a 64 y.o. female.  Hx Htn.  Hld.  Diet controlled DM 2.  Hidradenitis.  Thyroid nodule.  Chronic, normocytic anemia, dating to 2016.  Leukopenia.  Surgeries include breast reduction, knee surgery tubal ligation hx internal hemorrhoids and colon polyps (TAs 2007, 2012).   Colonoscopy 03/2016.  PMD progress note mentions mild right sided diverticulosis, moderate int hemorrhoids, 2 diminutive polyps (lymphoid nodule and TA) but no op note or corresponding pathology in Epic. Repeat colonoscopy due 03/2021. Colonoscopy/EGD 08/2018.  Again no actual reports but hematology note from 08/2018 mentions "mild mucosal erosions" above, "grade 1 hemorrhoids" below.  Patient takes omeprazole 20 mg daily  Bilateral  paraclinoid internal carotid artery aneurysm.   S/p 12/29/2019 embolization and flow diverticular stent placement to left paraclinoid internal carotid artery aneurysm.  Started Brilinta, low-dose aspirin in late 10/2019 Hgb in 10/2019 11.3, MCV 81.  8.6/81 on 12/29/19.    3/14 - 01/18/20 admission with CT confirmed acute ascending colon diverticulitis.  Completed course of  IV Cipro, Flagyl; no abx at discharge.  Hgb nadir of 7.6, 9.2 at admission and 8 at discharge.  Never transfused. 1 of 4 stools FOBT positive.  Brilinta briefly held.   On the day of discharge, the 23rd, she passed a small amount of red blood.  On the for the next 4 days and into the morning of the 28th she had brown stools.  On Saturday the  27th patient got lightheaded, weak and had a syncopal spell and fell, hit her head.  EMS came and assessed her at home and determined she was hypotensive from her BP meds.  Blood pressure got better and she was never brought to a hospital.  She did well Sunday morning, had a brown stool.  On Sunday evening she had a large episode of painless hematochezia and her husband brought her to [place].  Since her arrival there and subsequent transfer to [facility], she has had 2 smaller episodes of bleeding on Monday and one this morning, also quite small.  No dizziness, no weakness, no abdominal pain.  No nausea or vomiting.  Hgb 8 >> 7.3 >> 6.8.  Blood transfusion ordered. Due to the head trauma a head CT was performed and was benign.  Social Hx 2 light beers or a glass of wine daily but has not had anything to drink since 10/2019.  Quit smoking more than 25 years ago. Family history Lung cancer in her mother.  Colon cancer (??) in her father.  Past Medical History:  Diagnosis Date  . Complication of anesthesia   . Diverticulitis   . High cholesterol   . High cholesterol   . History of cerebral aneurysm   . Hypertension   . PONV (postoperative nausea and vomiting)   . Tubal ligation status     Past Surgical History:  Procedure Laterality Date  . BREAST SURGERY    . CEREBRAL ANEURYSM REPAIR  12/29/2019  . COLONOSCOPY    . TUBAL LIGATION      Prior to Admission medications   Medication Sig Start Date End Date Taking? Authorizing Provider  aspirin 81 MG EC tablet Take 81 mg by mouth daily. 12/22/19  Yes [provider]  baclofen (LIORESAL) 10 MG tablet Take 5-10 mg by mouth every 8 (eight) hours as needed for muscle spasms. 11/10/19  Yes [provider]  BRILINTA 90 MG TABS tablet Take 90 mg by mouth 2 (two) times daily. 12/10/19  Yes [provider]  loratadine (CLARITIN) 10 MG tablet Take 1 tablet (10 mg total) by mouth daily. Patient taking  differently: Take 10 mg by mouth daily as needed for allergies.  01/19/20  Yes [name], MD  Multiple Vitamin (MULTIVITAMIN WITH MINERALS) TABS tablet Take 1 tablet by mouth daily.   Yes [provider]  omeprazole (PRILOSEC) 40 MG capsule Take 40 mg by mouth daily. 12/20/19  Yes [provider]  pravastatin (PRAVACHOL) 40 MG tablet Take 40 mg by mouth daily. 12/25/19  Yes [provider]  loperamide (IMODIUM A-D) 2 MG tablet Take 1 tablet (2 mg total) by mouth 4 (four) times daily as  needed for diarrhea or loose stools. Patient not taking: Reported on 01/10/2020 03/28/13   Fredia Sorrow, MD  ondansetron (ZOFRAN ODT) 4 MG disintegrating tablet Take 1 tablet (4 mg total) by mouth every 8 (eight) hours as needed for nausea. Patient not taking: Reported on 01/10/2020 03/29/13   Fredia Sorrow, MD  promethazine (PHENERGAN) 25 MG tablet Take 1 tablet (25 mg total) by mouth every 6 (six) hours as needed for nausea. Patient not taking: Reported on 01/10/2020 03/29/13   Fredia Sorrow, MD    Scheduled Meds: . sodium chloride   Intravenous Once  . pantoprazole  40 mg Oral Daily  . pravastatin  40 mg Oral Daily   Infusions:  PRN Meds:    Allergies as of 01/23/2020 - Review Complete 01/23/2020  Allergen Reaction Noted  . Codeine  05/27/2011  . Darvocet [propoxyphene n-acetaminophen]  05/27/2011  . Lisinopril Other (See Comments) 03/26/2014  . Morphine and related Nausea And Vomiting 01/09/2012  . Amoxicillin-pot clavulanate Rash 03/26/2014  . Penicillins Rash 03/26/2014    History reviewed. No pertinent family history.  Social History   Socioeconomic History  . Marital status: Married    Spouse name: Not on file  . Number of children: Not on file  . Years of education: Not on file  . Highest education level: Not on file  Occupational History  . Not on file  Tobacco Use  . Smoking status: Former Research scientist (life sciences)  . Smokeless tobacco: Never Used  . Tobacco  comment: QUIT IN 1992  Substance and Sexual Activity  . Alcohol use: Yes  . Drug use: No  . Sexual activity: Yes    Birth control/protection: None, Surgical  Other Topics Concern  . Not on file  Social History Narrative  . Not on file   Social Determinants of Health   Financial Resource Strain:   . Difficulty of Paying Living Expenses:   Food Insecurity:   . Worried About Charity fundraiser in the Last Year:   . Arboriculturist in the Last Year:   Transportation Needs:   . Film/video editor (Medical):   Marland Kitchen Lack of Transportation (Non-Medical):   Physical Activity:   . Days of Exercise per Week:   . Minutes of Exercise per Session:   Stress:   . Feeling of Stress :   Social Connections:   . Frequency of Communication with Friends and Family:   . Frequency of Social Gatherings with Friends and Family:   . Attends Religious Services:   . Active Member of Clubs or Organizations:   . Attends Archivist Meetings:   Marland Kitchen Marital Status:   Intimate Partner Violence:   . Fear of Current or Ex-Partner:   . Emotionally Abused:   Marland Kitchen Physically Abused:   . Sexually Abused:     REVIEW OF SYSTEMS: Constitutional: No longer having any weakness or dizziness. ENT:  No nose bleeds Pulm: No shortness of breath, no cough. CV:  No palpitations, no LE edema.  No angina. GU:  No hematuria, no frequency GI: Good appetite.  No weight loss.  No nausea or vomiting.  No abdominal pain. Heme: GI bleeding as per HPI.  Bruises easily since starting aspirin/Brilinta. Transfusions: None prior to those ordered within the last hour. Neuro:  + headaches, no peripheral tingling or numbness.  Syncope as per HPI, no dizziness since then. Derm:  No itching, no rash or sores.  Endocrine:  No sweats or chills.  No polyuria or dysuria  Immunization: Reviewed.  Patient tells me that on 3/14 she had the first of 2 Covid shots Travel:  None beyond local counties in last few months.    PHYSICAL  EXAM: Vital signs in last 24 hours: Vitals:   01/25/20 0627 01/25/20 1010  BP: 139/76 125/74  Pulse: 98 87  Resp: 17 18  Temp: 98 F (36.7 C) 98.2 F (36.8 C)  SpO2: 100% 100%   Wt Readings from Last 3 Encounters:  01/24/20 82.1 kg  01/09/20 85.7 kg  09/15/17 81.6 kg    General: Talkative, comfortable, nonill appearing.  Looks well. Head: No facial asymmetry or swelling.  No signs of head trauma. Eyes: No icterus, no conjunctival pallor. Ears: Not hard of hearing Nose: No congestion or discharge Mouth: Excellent dentition, tongue midline.  Mucosa moist, pink, clear. Neck: No JVD, no masses, no thyromegaly. Lungs: Clear bilaterally.  No labored breathing or cough. Heart: RRR.  No MRG.  S1, S2 present Abdomen: Soft.  Not tender, not distended.  No HSM, masses, bruits, hernias.. Rectal: Deferred. Musc/Skeltl: No joint redness, swelling or gross deformities. Extremities: No CCE. Neurologic: Oriented x3.  Detailed, excellent recall of history.  No tremors, no limb weakness.  No gross deficits. Skin: No telangiectasia, no significant purpura/bruising.  No suspicious lesions or sores. Tattoos: None observed. Nodes: No cervical adenopathy Psych: Pleasant, cooperative.  Speech fluid  Intake/Output from previous day: 03/29 0701 - 03/30 0700 In: 1409.4 [P.O.:480; I.V.:929.4] Out: -  Intake/Output this shift: Total I/O In: 315 [Blood:315] Out: -   LAB RESULTS: Recent Labs    01/23/20 2112 01/24/20 0459 01/24/20 1443 01/24/20 2034 01/25/20 0339  WBC 4.3  --   --   --  3.2*  HGB 8.0*   < > 8.0* 7.3* 6.8*  HCT 25.2*   < > 26.3* 23.2* 22.3*  PLT 525*  --   --   --  477*   < > = values in this interval not displayed.   BMET Lab Results  Component Value Date   NA 141 01/25/2020   NA 132 (L) 01/23/2020   NA 141 01/18/2020   K 4.2 01/25/2020   K 4.0 01/23/2020   K 3.9 01/18/2020   CL 110 01/25/2020   CL 99 01/23/2020   CL 110 01/18/2020   CO2 24 01/25/2020   CO2 23  01/23/2020   CO2 24 01/18/2020   GLUCOSE 98 01/25/2020   GLUCOSE 105 (H) 01/23/2020   GLUCOSE 91 01/18/2020   BUN 10 01/25/2020   BUN 13 01/23/2020   BUN 5 (L) 01/18/2020   CREATININE 0.86 01/25/2020   CREATININE 1.13 (H) 01/23/2020   CREATININE 0.90 01/18/2020   CALCIUM 8.9 01/25/2020   CALCIUM 9.1 01/23/2020   CALCIUM 8.6 (L) 01/18/2020   LFT No results for input(s): PROT, ALBUMIN, AST, ALT, ALKPHOS, BILITOT, BILIDIR, IBILI in the last 72 hours. PT/INR No results found for: INR, PROTIME   RADIOLOGY STUDIES: CT Head Wo Contrast  Result Date: 01/23/2020 CLINICAL DATA:  [place], syncope, bloody stool EXAM: CT HEAD WITHOUT CONTRAST TECHNIQUE: Contiguous axial images were obtained from the base of the skull through the vertex without intravenous contrast. COMPARISON:  11/26/2019 FINDINGS: Brain: No acute infarct or hemorrhage. Lateral ventricles and midline structures are unremarkable. No acute extra-axial fluid collections. No mass effect. Vascular: No hyperdense vessel or unexpected calcification. Skull: Normal. Negative for fracture or focal lesion. Sinuses/Orbits: No acute finding. Other: None IMPRESSION: 1. Stable head CT, no acute intracranial process. Electronically Signed  By: Randa Ngo M.D.   On: 01/23/2020 22:34

## 2020-01-25 NOTE — Progress Notes (Signed)
PROGRESS NOTE    [name]  [id number] DOB: 1956-05-21 DOA: 01/23/2020 PCP: Lilian Coma., MD     Brief Narrative:  Nancy Hendersonis a 64 y.o.femalewith medical history significant ofrecent hospital admission 01/09/2020-01/18/2020 for acute diverticulitis/lower GI bleed, hypertension, hyperlipidemia,status post recent embolization of left paraclinoid ICA aneurysm at [facility] on 12/09/2019 currently onaspirin and Brilinta presenting to the ED for evaluation of syncope and hematochezia. It is reported that EMS was called after patient had syncope. She was orthostatic at that time with standing blood pressure of 82 systolic. She was given IV fluids but refused transport to the hospital at that time.She states yesterday she had one episode of stool with dark red blood but no further episodes since then. In the ED, she was FOBT positive.   New events last 24 hours / Subjective: Continues to have bloody bowel movements, dark red in nature. Had 2 BM yesterday, 1 BM this morning. No lightheadedness or dizziness. Remains tachycardic in the 140s with activity. Denies abdominal pain. Getting 1u pRBC transfusion this morning   Assessment & Plan:   Principal Problem:   Acute lower GI bleeding Active Problems:   Cerebral aneurysm   Essential hypertension   Syncope   GERD (gastroesophageal reflux disease)   Diverticulosis   Diverticular bleed with GI blood loss  -Followed by [name] Comment, GI at [facility]. Last colonoscopy in 2017.  -Continue to monitor H&H, transfuse for Hgb < 7 -Having bloody BMs still with Hgb 6.8. Transfuse 1u pRBC today.  -GI consulted  -Tagged RBC stat  -Stop aspirin/brilinta (last dose 3/30)  History of recent embolization of left paraclinoid ICA aneurysm at [facility] on 12/09/2019 -Hold aspirin/brilinta (last dose 3/30) -Continue pravachol   Syncope -Likely related to above as well as antihypertensive use -Orthostatic BP on 3/29 negative    HTN -Normotensive, 125/74 at this time   GERD -Continue protonix     DVT prophylaxis: SCD Code Status: Full Family Communication: At bedside Disposition Plan:  . Patient is from home prior to admission. . Currently in-hospital treatment needed due to GI bleeding. . Suspect patient will discharge home in 1-2 days.    Consultants:   GI  Procedures:   None   Antimicrobials:  Anti-infectives (From admission, onward)   None        Objective: Vitals:   01/25/20 0549 01/25/20 0612 01/25/20 0627 01/25/20 1010  BP: 128/67 132/83 139/76 125/74  Pulse: 98 98 98 87  Resp: 18 18 17 18   Temp: 98.4 F (36.9 C) 98.1 F (36.7 C) 98 F (36.7 C) 98.2 F (36.8 C)  TempSrc: Oral Oral Oral Oral  SpO2: 98% 100% 100% 100%  Weight:      Height:        Intake/Output Summary (Last 24 hours) at 01/25/2020 1059 Last data filed at 01/25/2020 1010 Gross per 24 hour  Intake 1724.35 ml  Output --  Net 1724.35 ml   Filed Weights   01/23/20 2044 01/24/20 0255  Weight: 84.4 kg 82.1 kg    Examination:  General exam: Appears calm and comfortable  Respiratory system: Clear to auscultation. Respiratory effort normal. No respiratory distress. No conversational dyspnea.  Cardiovascular system: S1 & S2 heard, tachycardic, regular rhythm, rate 140s on my exam. No murmurs. No pedal edema. Gastrointestinal system: Abdomen is nondistended, soft and nontender. Normal bowel sounds heard. Central nervous system: Alert and oriented. No focal neurological deficits. Speech clear.  Extremities: Symmetric in appearance  Skin: No rashes, lesions or ulcers  on exposed skin  Psychiatry: Judgement and insight appear normal. Mood & affect appropriate.   Data Reviewed: I have personally reviewed following labs and imaging studies  CBC: Recent Labs  Lab 01/23/20 2112 01/23/20 2112 01/24/20 0459 01/24/20 0843 01/24/20 1443 01/24/20 2034 01/25/20 0339  WBC 4.3  --   --   --   --   --  3.2*   NEUTROABS 2.6  --   --   --   --   --   --   HGB 8.0*   < > 8.0* 7.6* 8.0* 7.3* 6.8*  HCT 25.2*   < > 25.0* 25.0* 26.3* 23.2* 22.3*  MCV 84.0  --   --   --   --   --  85.1  PLT 525*  --   --   --   --   --  477*   < > = values in this interval not displayed.   Basic Metabolic Panel: Recent Labs  Lab 01/23/20 2112 01/25/20 0339  NA 132* 141  K 4.0 4.2  CL 99 110  CO2 23 24  GLUCOSE 105* 98  BUN 13 10  CREATININE 1.13* 0.86  CALCIUM 9.1 8.9   GFR: Estimated Creatinine Clearance: 72.8 mL/min (by C-G formula based on SCr of 0.86 mg/dL). Liver Function Tests: No results for input(s): AST, ALT, ALKPHOS, BILITOT, PROT, ALBUMIN in the last 168 hours. No results for input(s): LIPASE, AMYLASE in the last 168 hours. No results for input(s): AMMONIA in the last 168 hours. Coagulation Profile: No results for input(s): INR, PROTIME in the last 168 hours. Cardiac Enzymes: No results for input(s): CKTOTAL, CKMB, CKMBINDEX, TROPONINI in the last 168 hours. BNP (last 3 results) No results for input(s): PROBNP in the last 8760 hours. HbA1C: No results for input(s): HGBA1C in the last 72 hours. CBG: No results for input(s): GLUCAP in the last 168 hours. Lipid Profile: No results for input(s): CHOL, HDL, LDLCALC, TRIG, CHOLHDL, LDLDIRECT in the last 72 hours. Thyroid Function Tests: No results for input(s): TSH, T4TOTAL, FREET4, T3FREE, THYROIDAB in the last 72 hours. Anemia Panel: No results for input(s): VITAMINB12, FOLATE, FERRITIN, TIBC, IRON, RETICCTPCT in the last 72 hours. Sepsis Labs: No results for input(s): PROCALCITON, LATICACIDVEN in the last 168 hours.  Recent Results (from the past 240 hour(s))  SARS CORONAVIRUS 2 (TAT 6-24 HRS) Nasopharyngeal Nasopharyngeal Swab     Status: None   Collection Time: 01/24/20 12:49 AM   Specimen: Nasopharyngeal Swab  Result Value Ref Range Status   SARS Coronavirus 2 NEGATIVE NEGATIVE Final    Comment: (NOTE) SARS-CoV-2 target nucleic  acids are NOT DETECTED. The SARS-CoV-2 RNA is generally detectable in upper and lower respiratory specimens during the acute phase of infection. Negative results do not preclude SARS-CoV-2 infection, do not rule out co-infections with other pathogens, and should not be used as the sole basis for treatment or other patient management decisions. Negative results must be combined with clinical observations, patient history, and epidemiological information. The expected result is Negative. Fact Sheet for Patients: SugarRoll.be Fact Sheet for Healthcare Providers: https://www.woods-mathews.com/ This test is not yet approved or cleared by the [place] FDA and  has been authorized for detection and/or diagnosis of SARS-CoV-2 by FDA under an Emergency Use Authorization (EUA). This EUA will remain  in effect (meaning this test can be used) for the duration of the COVID-19 declaration under Section 56 4(b)(1) of the Act, 21 U.S.C. section 360bbb-3(b)(1), unless the authorization is terminated or  revoked sooner. Performed at [facility], [place] [address], [place], [place] [address]       Radiology Studies: CT Head Wo Contrast  Result Date: 01/23/2020 CLINICAL DATA:  [place], syncope, bloody stool EXAM: CT HEAD WITHOUT CONTRAST TECHNIQUE: Contiguous axial images were obtained from the base of the skull through the vertex without intravenous contrast. COMPARISON:  11/26/2019 FINDINGS: Brain: No acute infarct or hemorrhage. Lateral ventricles and midline structures are unremarkable. No acute extra-axial fluid collections. No mass effect. Vascular: No hyperdense vessel or unexpected calcification. Skull: Normal. Negative for fracture or focal lesion. Sinuses/Orbits: No acute finding. Other: None IMPRESSION: 1. Stable head CT, no acute intracranial process. Electronically Signed   By: Randa Ngo M.D.   On: 01/23/2020 22:34      Scheduled Meds: .  sodium chloride   Intravenous Once  . aspirin EC  81 mg Oral Daily  . pantoprazole  40 mg Oral Daily  . pravastatin  40 mg Oral Daily  . ticagrelor  90 mg Oral BID   Continuous Infusions:   LOS: 1 day      Time spent: 25 minutes   [name], DO [facility] 01/25/2020, 10:59 AM   Available via Epic secure chat 7am-7pm After these hours, please refer to coverage provider listed on [email or web address]

## 2020-01-25 NOTE — Consult Note (Signed)
Chief Complaint: Patient was seen in consultation today for  Chief Complaint  Patient presents with  . GI Bleeding   at the request of [name], DO   Referring Physician(s): [name], DO  Patient Status: [facility] - In-pt  History of Present Illness: Nancy Tran is a 64 y.o. female who presents with painless hematochezia.  She has a history of bilateral paraclinoid internal carotid artery aneurysms which were treated with flow diverting stents at the beginning of the month.  She is currently on Brilinta and low-dose aspirin.  She was recently treated for acute diverticulitis and discharged on 01/18/2020.  She currently has acute blood loss anemia and required transfusion earlier today.  A tagged red blood cell nuclear medicine study was performed which demonstrates a focus of active bleeding in the right upper quadrant, likely with in the region of the ascending colon which corresponds with her recent site of diverticulitis.  Interventional radiology is now consulted for possible angiogram and embolization.  Patient seen and evaluated.  She is currently doing well and denies abdominal pain, dizziness, chest pain or shortness of breath.  She has had several episodes of bloody stool throughout the day.  Unfortunately, she should not stop her Brilinta at this time as that could result in thrombosis of her recently placed flow diverting stents resulting in stroke.  Past Medical History:  Diagnosis Date  . Adenoma of colon 2007   Adenomatous polyps in 2007, 2012, 2017.  GI MD is [facility] in [place].    . Complication of anesthesia   . Diverticulitis 12/2019  . Gastritis, erosive 08/2018   EGD by Dr Mitchell Heir.    . High cholesterol   . History of cerebral aneurysm    bil internal carotid artery aneurysms.   embollization and stent to left 12/29/19 at [facility].    . Hypertension   . Normocytic anemia 2016  . PONV (postoperative nausea and vomiting)   . Tubal ligation  status     Past Surgical History:  Procedure Laterality Date  . BREAST SURGERY    . CEREBRAL ANEURYSM REPAIR  12/29/2019  . COLONOSCOPY    . TUBAL LIGATION      Allergies: Codeine, Darvocet [propoxyphene n-acetaminophen], Lisinopril, Morphine and related, Amoxicillin-pot clavulanate, and Penicillins  Medications: Prior to Admission medications   Medication Sig Start Date End Date Taking? Authorizing Provider  aspirin 81 MG EC tablet Take 81 mg by mouth daily. 12/22/19  Yes [provider]  baclofen (LIORESAL) 10 MG tablet Take 5-10 mg by mouth every 8 (eight) hours as needed for muscle spasms. 11/10/19  Yes [provider]  BRILINTA 90 MG TABS tablet Take 90 mg by mouth 2 (two) times daily. 12/10/19  Yes [provider]  loratadine (CLARITIN) 10 MG tablet Take 1 tablet (10 mg total) by mouth daily. Patient taking differently: Take 10 mg by mouth daily as needed for allergies.  01/19/20  Yes [name], MD  Multiple Vitamin (MULTIVITAMIN WITH MINERALS) TABS tablet Take 1 tablet by mouth daily.   Yes [provider]  omeprazole (PRILOSEC) 40 MG capsule Take 40 mg by mouth daily. 12/20/19  Yes [provider]  pravastatin (PRAVACHOL) 40 MG tablet Take 40 mg by mouth daily. 12/25/19  Yes [provider]  loperamide (IMODIUM A-D) 2 MG tablet Take 1 tablet (2 mg total) by mouth 4 (four) times daily as needed for diarrhea or loose stools. Patient not taking: Reported on 01/10/2020 03/28/13  Fredia Sorrow, MD  ondansetron (ZOFRAN ODT) 4 MG disintegrating tablet Take 1 tablet (4 mg total) by mouth every 8 (eight) hours as needed for nausea. Patient not taking: Reported on 01/10/2020 03/29/13   Fredia Sorrow, MD  promethazine (PHENERGAN) 25 MG tablet Take 1 tablet (25 mg total) by mouth every 6 (six) hours as needed for nausea. Patient not taking: Reported on 01/10/2020 03/29/13   Fredia Sorrow, MD     History reviewed. No pertinent family  history.  Social History   Socioeconomic History  . Marital status: Married    Spouse name: Not on file  . Number of children: Not on file  . Years of education: Not on file  . Highest education level: Not on file  Occupational History  . Not on file  Tobacco Use  . Smoking status: Former Research scientist (life sciences)  . Smokeless tobacco: Never Used  . Tobacco comment: QUIT IN 1992  Substance and Sexual Activity  . Alcohol use: Yes  . Drug use: No  . Sexual activity: Yes    Birth control/protection: None, Surgical  Other Topics Concern  . Not on file  Social History Narrative  . Not on file   Social Determinants of Health   Financial Resource Strain:   . Difficulty of Paying Living Expenses:   Food Insecurity:   . Worried About Charity fundraiser in the Last Year:   . Arboriculturist in the Last Year:   Transportation Needs:   . Film/video editor (Medical):   Marland Kitchen Lack of Transportation (Non-Medical):   Physical Activity:   . Days of Exercise per Week:   . Minutes of Exercise per Session:   Stress:   . Feeling of Stress :   Social Connections:   . Frequency of Communication with Friends and Family:   . Frequency of Social Gatherings with Friends and Family:   . Attends Religious Services:   . Active Member of Clubs or Organizations:   . Attends Archivist Meetings:   Marland Kitchen Marital Status:     Review of Systems: A 12 point ROS discussed and pertinent positives are indicated in the HPI above.  All other systems are negative.  Review of Systems  Vital Signs: BP 137/77 (BP Location: Left Arm)   Pulse 86   Temp 98.2 F (36.8 C) (Oral)   Resp 18   Ht 5\' 7"  (1.702 m)   Wt 82.1 kg   SpO2 100%   BMI 28.35 kg/m   Physical Exam Vitals reviewed.  Constitutional:      Appearance: Normal appearance.  HENT:     Head: Normocephalic and atraumatic.  Eyes:     General: Scleral icterus present.  Cardiovascular:     Rate and Rhythm: Normal rate.  Pulmonary:     Effort:  Pulmonary effort is normal.  Abdominal:     General: Abdomen is flat.     Palpations: Abdomen is soft.  Skin:    General: Skin is warm and dry.  Neurological:     Mental Status: She is alert and oriented to person, place, and time.  Psychiatric:        Mood and Affect: Mood normal.        Behavior: Behavior normal.     Imaging: CT Head Wo Contrast  Result Date: 01/23/2020 CLINICAL DATA:  [place], syncope, bloody stool EXAM: CT HEAD WITHOUT CONTRAST TECHNIQUE: Contiguous axial images were obtained from the base of the skull through the vertex without intravenous  contrast. COMPARISON:  11/26/2019 FINDINGS: Brain: No acute infarct or hemorrhage. Lateral ventricles and midline structures are unremarkable. No acute extra-axial fluid collections. No mass effect. Vascular: No hyperdense vessel or unexpected calcification. Skull: Normal. Negative for fracture or focal lesion. Sinuses/Orbits: No acute finding. Other: None IMPRESSION: 1. Stable head CT, no acute intracranial process. Electronically Signed   By: Randa Ngo M.D.   On: 01/23/2020 22:34   NM GI Blood Loss  Result Date: 01/25/2020 CLINICAL DATA:  GI bleed.  Patient on blood thinners.  Bloody BM. EXAM: NUCLEAR MEDICINE GASTROINTESTINAL BLEEDING SCAN TECHNIQUE: Sequential abdominal images were obtained following intravenous administration of Tc-58m labeled red blood cells. RADIOPHARMACEUTICALS:  22.8 mCi Tc-44m pertechnetate in-vitro labeled red cells. COMPARISON:  None. FINDINGS: During the first hour of imaging there is a focal area of increased radiotracer uptake localizing to the right colon. Over time, this area increases in intensity. On the second hour there is antegrade progression of the radiopharmaceutical conforming to the expected course of the ascending colon, transverse colon and descending colon. Radiotracer activity extends to the distal descending colon. IMPRESSION: 1. Examination is positive for active GI bleed. The source of  the bleed is felt to be proximal right colon. 2. Critical Value/emergent results were called by telephone at the time of interpretation on 01/25/2020 at 5:11 pm to provider [facility] , who verbally acknowledged these results. Electronically Signed   By: Kerby Moors M.D.   On: 01/25/2020 17:11   CT ABDOMEN PELVIS W CONTRAST  Result Date: 01/09/2020 CLINICAL DATA:  Sudden onset abdominal pain EXAM: CT ABDOMEN AND PELVIS WITH CONTRAST TECHNIQUE: Multidetector CT imaging of the abdomen and pelvis was performed using the standard protocol following bolus administration of intravenous contrast. CONTRAST:  176mL OMNIPAQUE IOHEXOL 300 MG/ML  SOLN COMPARISON:  CT abdomen pelvis 03/29/2013 (report only) FINDINGS: Lower chest: Lung bases are clear. Normal heart size. No pericardial effusion. Hepatobiliary: No focal liver abnormality is seen. No gallstones, gallbladder wall thickening, or biliary dilatation. Pancreas: Unremarkable. No pancreatic ductal dilatation or surrounding inflammatory changes. Spleen: Normal in size without focal abnormality. Adrenals/Urinary Tract: Adrenal glands are unremarkable. Kidneys are normal, without renal calculi, focal lesion, or hydronephrosis. Bladder is unremarkable. Stomach/Bowel: Distal esophagus, stomach and duodenal sweep are unremarkable. No small bowel wall thickening or dilatation. No evidence of obstruction. There is circumferential thickening of the ascending colon with more focal phlegmonous change which appears to be centered on several right-sided colonic diverticula (2/45, 5/33, 6/33). No extraluminal gas or fluid collection is seen. Minimal thickening at the appendiceal base with a more normal appearing appendiceal tip favored to be reactive to the more focal colonic process. Distal colon is unremarkable. Vascular/Lymphatic: Atherosclerotic plaque within the normal caliber abdominal aorta and branch vessels. Few reactive lymph nodes in the right lower quadrant. No  pathologically enlarged nodes in the abdomen or pelvis. Reproductive: Normal appearance of the uterus and adnexal structures. Other: No abdominopelvic free fluid or free gas. No bowel containing hernias. Musculoskeletal: Mild grade 1 anterolisthesis L4 on 5 without pars defects. Minimal discogenic and facet degenerative change maximal L5-S1. No acute osseous abnormality or suspicious osseous lesion. IMPRESSION: 1. Circumferential thickening of the ascending colon with more focal phlegmonous change which appears to be centered on several right-sided colonic diverticula. No extraluminal gas or fluid collection is seen to suggest perforation. Findings are most suggestive of acute diverticulitis. 2. Minimal thickening at the appendiceal base with a more normal appearing appendiceal tip favored to be reactive to the  above process. 3. Mild grade 1 anterolisthesis L4 on 5 without pars defects. 4. Aortic Atherosclerosis (ICD10-I70.0). Electronically Signed   By: Lovena Le M.D.   On: 01/09/2020 22:01    Labs:  CBC: Recent Labs    01/17/20 0547 01/17/20 0547 01/18/20 0138 01/18/20 0138 01/23/20 2112 01/24/20 0459 01/24/20 1443 01/24/20 2034 01/25/20 0339 01/25/20 1128  WBC 5.1  --  5.7  --  4.3  --   --   --  3.2*  --   HGB 7.9*   < > 8.0*   < > 8.0*   < > 8.0* 7.3* 6.8* 8.5*  HCT 25.2*   < > 25.2*   < > 25.2*   < > 26.3* 23.2* 22.3* 25.7*  PLT 464*  --  508*  --  525*  --   --   --  477*  --    < > = values in this interval not displayed.    COAGS: No results for input(s): INR, APTT in the last 8760 hours.  BMP: Recent Labs    01/17/20 0547 01/18/20 0138 01/23/20 2112 01/25/20 0339  NA 143 141 132* 141  K 4.0 3.9 4.0 4.2  CL 107 110 99 110  CO2 26 24 23 24   GLUCOSE 90 91 105* 98  BUN <5* 5* 13 10  CALCIUM 9.0 8.6* 9.1 8.9  CREATININE 0.84 0.90 1.13* 0.86  GFRNONAA >60 >60 51* >60  GFRAA >60 >60 59* >60    LIVER FUNCTION TESTS: Recent Labs    01/15/20 0323 01/16/20 0233  01/17/20 0547 01/18/20 0138  BILITOT 0.5 0.4 0.3 0.5  AST 18 17 21 20   ALT 12 13 15 15   ALKPHOS 47 44 47 44  PROT 5.6* 5.4* 5.7* 5.7*  ALBUMIN 2.9* 2.8* 2.9* 3.0*    TUMOR MARKERS: No results for input(s): AFPTM, CEA, CA199, CHROMGRNA in the last 8760 hours.  Assessment and Plan:  Acute GI bleed which appears to be arising from the ascending colon at the site of patient's prior diverticulitis.  This is suspicious for a diverticular bleed.  As patient cannot come off her Brilinta, urgent angiogram is warranted.  If the bleeding vessel can be identified at the time of angiography, coil embolization will be performed.  1.)  To IR for angiogram and possible coil embolization.  Thank you for this interesting consult.  I greatly enjoyed meeting [employer] and look forward to participating in their care.  A copy of this report was sent to the requesting provider on this date.  Electronically Signed: Jacqulynn Cadet, MD 01/25/2020, 6:54 PM   I spent a total of 40 Minutes  in face to face in clinical consultation, greater than 50% of which was counseling/coordinating care for lower GI bleeding.

## 2020-01-25 NOTE — Procedures (Signed)
Interventional Radiology Procedure Note  Procedure: Multi-selective visceral angiogram.    Complications: None  Estimated Blood Loss: None  Recommendations: - No active bleeding at this time.  - There is an area of hyperemia in the mid ascending colon that was likely the source of recent bleeding.  This is too large a territory for embolization.  - Trend H&H, transfuse as needed - Treat underlying colitis.  Consider f/u colonoscopy to exclude neoplasm  Signed,  Criselda Peaches, MD

## 2020-01-26 DIAGNOSIS — R933 Abnormal findings on diagnostic imaging of other parts of digestive tract: Secondary | ICD-10-CM

## 2020-01-26 LAB — TYPE AND SCREEN
ABO/RH(D): O POS
Antibody Screen: NEGATIVE
Unit division: 0

## 2020-01-26 LAB — BPAM RBC
Blood Product Expiration Date: 202104282359
ISSUE DATE / TIME: 202103300558
Unit Type and Rh: 5100

## 2020-01-26 LAB — CBC
HCT: 23.9 % — ABNORMAL LOW (ref 36.0–46.0)
Hemoglobin: 7.5 g/dL — ABNORMAL LOW (ref 12.0–15.0)
MCH: 26.8 pg (ref 26.0–34.0)
MCHC: 31.4 g/dL (ref 30.0–36.0)
MCV: 85.4 fL (ref 80.0–100.0)
Platelets: 434 10*3/uL — ABNORMAL HIGH (ref 150–400)
RBC: 2.8 MIL/uL — ABNORMAL LOW (ref 3.87–5.11)
RDW: 14.8 % (ref 11.5–15.5)
WBC: 3.7 10*3/uL — ABNORMAL LOW (ref 4.0–10.5)
nRBC: 0 % (ref 0.0–0.2)

## 2020-01-26 LAB — HEMOGLOBIN AND HEMATOCRIT, BLOOD
HCT: 25.6 % — ABNORMAL LOW (ref 36.0–46.0)
HCT: 26.9 % — ABNORMAL LOW (ref 36.0–46.0)
HCT: 27.8 % — ABNORMAL LOW (ref 36.0–46.0)
Hemoglobin: 8.1 g/dL — ABNORMAL LOW (ref 12.0–15.0)
Hemoglobin: 8.5 g/dL — ABNORMAL LOW (ref 12.0–15.0)
Hemoglobin: 8.7 g/dL — ABNORMAL LOW (ref 12.0–15.0)

## 2020-01-26 LAB — GLUCOSE, CAPILLARY: Glucose-Capillary: 81 mg/dL (ref 70–99)

## 2020-01-26 LAB — HEMOGLOBIN: Hemoglobin: 8.4 g/dL — ABNORMAL LOW (ref 12.0–15.0)

## 2020-01-26 MED ORDER — PEG-KCL-NACL-NASULF-NA ASC-C 100 G PO SOLR
0.5000 | Freq: Once | ORAL | Status: AC
  Administered 2020-01-26: 100 g via ORAL
  Filled 2020-01-26: qty 1

## 2020-01-26 MED ORDER — PEG-KCL-NACL-NASULF-NA ASC-C 100 G PO SOLR: 0.5000 | Freq: Once | ORAL | Status: DC

## 2020-01-26 MED ORDER — PEG-KCL-NACL-NASULF-NA ASC-C 100 G PO SOLR
0.5000 | Freq: Once | ORAL | Status: AC
  Administered 2020-01-26: 100 g via ORAL

## 2020-01-26 MED ORDER — ASPIRIN EC 81 MG PO TBEC
81.0000 mg | DELAYED_RELEASE_TABLET | Freq: Every day | ORAL | Status: DC
  Administered 2020-01-26 – 2020-01-29 (×4): 81 mg via ORAL
  Filled 2020-01-26 (×4): qty 1

## 2020-01-26 MED ORDER — METOCLOPRAMIDE HCL 5 MG/ML IJ SOLN
10.0000 mg | Freq: Once | INTRAMUSCULAR | Status: AC
  Administered 2020-01-26: 10 mg via INTRAVENOUS
  Filled 2020-01-26: qty 2

## 2020-01-26 MED ORDER — METOCLOPRAMIDE HCL 5 MG/ML IJ SOLN: 10.0000 mg | Freq: Once | INTRAMUSCULAR | Status: DC

## 2020-01-26 MED ORDER — BISACODYL 5 MG PO TBEC
20.0000 mg | DELAYED_RELEASE_TABLET | Freq: Once | ORAL | Status: AC
  Administered 2020-01-26: 20 mg via ORAL
  Filled 2020-01-26: qty 4

## 2020-01-26 NOTE — H&P (View-Only) (Signed)
   Patient Name: Nancy Tran Date of Encounter: 01/26/2020, 10:14 AM    Subjective  + Nuc Med bleeding scan in ascending colon area but no bleeding seen at angiography No pain  No stools today   Objective  BP 128/81 (BP Location: Right Arm)   Pulse 82   Temp 98.4 F (36.9 C) (Oral)   Resp 16   Ht 5\' 7"  (1.702 m)   Wt 82.1 kg   SpO2 100%   BMI 28.35 kg/m  NAD Cor NL Lungs cta abd soft and NT  CBC Latest Ref Rng & Units 01/26/2020 01/25/2020 01/25/2020  WBC 4.0 - 10.5 K/uL 3.7(L) - 3.2(L)  Hemoglobin 12.0 - 15.0 g/dL 7.5(L) 8.5(L) 6.8(LL)  Hematocrit 36.0 - 46.0 % 23.9(L) 25.7(L) 22.3(L)  Platelets 150 - 400 K/uL 434(H) - 477(H)       Assessment and Plan  Lower GI bleed from ascending colon Circumferential thickening there CT 3/14 - thought to be diverticulitis - clinical sxs of pain gone On Brilinta s/p internal carotid aneurysm treatment with stent  I think we need to clarify the bleeding source given her need for antiPLT tx She understands and agrees to proceed  The risks and benefits as well as alternatives of endoscopic procedure(s) have been discussed and reviewed. All questions answered. The patient agrees to proceed. Increased bleeding risk explained but trying to protect the IC stent and will get back on ASA also   Gatha Mayer, MD, [facility] 01/26/2020 10:14 AM

## 2020-01-26 NOTE — Progress Notes (Signed)
Pt had small maroon colored BM.  Stacey Drain

## 2020-01-26 NOTE — Progress Notes (Signed)
   Patient Name: Nancy Tran Date of Encounter: 01/26/2020, 10:14 AM    Subjective  + Nuc Med bleeding scan in ascending colon area but no bleeding seen at angiography No pain  No stools today   Objective  BP 128/81 (BP Location: Right Arm)   Pulse 82   Temp 98.4 F (36.9 C) (Oral)   Resp 16   Ht 5\' 7"  (1.702 m)   Wt 82.1 kg   SpO2 100%   BMI 28.35 kg/m  NAD Cor NL Lungs cta abd soft and NT  CBC Latest Ref Rng & Units 01/26/2020 01/25/2020 01/25/2020  WBC 4.0 - 10.5 K/uL 3.7(L) - 3.2(L)  Hemoglobin 12.0 - 15.0 g/dL 7.5(L) 8.5(L) 6.8(LL)  Hematocrit 36.0 - 46.0 % 23.9(L) 25.7(L) 22.3(L)  Platelets 150 - 400 K/uL 434(H) - 477(H)       Assessment and Plan  Lower GI bleed from ascending colon Circumferential thickening there CT 3/14 - thought to be diverticulitis - clinical sxs of pain gone On Brilinta s/p internal carotid aneurysm treatment with stent  I think we need to clarify the bleeding source given her need for antiPLT tx She understands and agrees to proceed  The risks and benefits as well as alternatives of endoscopic procedure(s) have been discussed and reviewed. All questions answered. The patient agrees to proceed. Increased bleeding risk explained but trying to protect the IC stent and will get back on ASA also   Gatha Mayer, MD, [facility] [place] [facility] 01/26/2020 10:14 AM

## 2020-01-26 NOTE — Plan of Care (Signed)

## 2020-01-26 NOTE — Progress Notes (Signed)
PROGRESS NOTE    Nancy Tran  ZMO:[id number] DOB: February 14, 1956 DOA: 01/23/2020 PCP: Lilian Coma., MD     Brief Narrative:  Nancy Tran a 64 y.o.femalewith medical history significant ofrecent hospital admission 01/09/2020-01/18/2020 for acute diverticulitis/lower GI bleed, hypertension, hyperlipidemia,status post recent embolization of left paraclinoid ICA aneurysm at [facility] on 12/09/2019 currently onaspirin and Brilinta presenting to the ED for evaluation of syncope and hematochezia. It is reported that EMS was called after patient had syncope. She was orthostatic at that time with standing blood pressure of 82 systolic. She was given IV fluids but refused transport to the hospital at that time.She states yesterday she had one episode of stool with dark red blood but no further episodes since then. In the ED, she was FOBT positive.  Due to continued GI bleeding and drop in Hgb, she was transfused 1u pRBC 3/30 and underwent tagged RBC study, which was positive. She underwent angiogram without findings of active bleeding.   New events last 24 hours / Subjective: She had a small maroon colored BM this morning. Angiogram yesterday negative for active bleeding and she is now scheduled for colonoscopy tomorrow.   Assessment & Plan:   Principal Problem:   Acute lower GI bleeding Active Problems:   Cerebral aneurysm   Essential hypertension   Syncope   GERD (gastroesophageal reflux disease)   Diverticulosis   Diverticular bleed with GI blood loss  -Followed by [name] Comment, GI at [facility]. Last colonoscopy in 2017.  -Continue to monitor H&H, transfuse for Hgb < 7 -S/p 1u pRBC 3/30 -Tagged RBC scan positive, underwent angiogram which was negative for active bleeding  -GI following, planning colonoscopy 4/1  -Hgb stable this morning 8.4, continue to monitor H&H   History of recent embolization of left paraclinoid ICA aneurysm at [facility] on 12/09/2019 -Continue  aspirin/brilinta  -Continue pravachol   Syncope -Likely related to above as well as antihypertensive use -Orthostatic BP on 3/29 negative   HTN -Normotensive, 128/81 at this time   GERD -Continue protonix     DVT prophylaxis: SCD Code Status: Full Family Communication: Husband at bedside Disposition Plan:  . Patient is from home prior to admission. . Currently in-hospital treatment needed due to GI bleeding. Colonoscopy scheduled 4/1  . Suspect patient will discharge home in 1-2 days.    Consultants:   GI  IR    Antimicrobials:  Anti-infectives (From admission, onward)   None       Objective: Vitals:   01/26/20 0236 01/26/20 0331 01/26/20 0433 01/26/20 0530  BP: 120/60 120/66 111/61 128/81  Pulse: 79 86 71 82  Resp: _0 Temp: 98 F (36.7 C) 97.9 F (36.6 C) 98.2 F (36.8 C) 98.4 F (36.9 C)  TempSrc: Oral Oral Oral Oral  SpO2: 98% 100% 100% 100%  Weight:      Height:        Intake/Output Summary (Last 24 hours) at 01/26/2020 1141 Last data filed at 01/26/2020 0600 Gross per 24 hour  Intake 480 ml  Output --  Net 480 ml   Filed Weights   01/23/20 2044 01/24/20 0255  Weight: 84.4 kg 82.1 kg    Examination: General exam: Appears calm and comfortable  Respiratory system: Clear to auscultation. Respiratory effort normal. Cardiovascular system: S1 & S2 heard, RRR. No pedal edema. Gastrointestinal system: Abdomen is nondistended, soft and nontender. Normal bowel sounds heard. Central nervous system: Alert and oriented. Non focal exam. Speech clear  Extremities: Symmetric in  appearance bilaterally  Skin: No rashes, lesions or ulcers on exposed skin  Psychiatry: Judgement and insight appear stable. Tearful    Data Reviewed: I have personally reviewed following labs and imaging studies  CBC: Recent Labs  Lab 01/23/20 2112 01/24/20 0459 01/24/20 1443 01/24/20 1443 01/24/20 2034 01/25/20 0339 01/25/20 1128 01/26/20 0451  01/26/20 1058  WBC 4.3  --   --   --   --  3.2*  --  3.7*  --   NEUTROABS 2.6  --   --   --   --   --   --   --   --   HGB 8.0*   < > 8.0*   < > 7.3* 6.8* 8.5* 7.5* 8.4*  HCT 25.2*   < > 26.3*  --  23.2* 22.3* 25.7* 23.9*  --   MCV 84.0  --   --   --   --  85.1  --  85.4  --   PLT 525*  --   --   --   --  477*  --  434*  --    < > = values in this interval not displayed.   Basic Metabolic Panel: Recent Labs  Lab 01/23/20 2112 01/25/20 0339  NA 132* 141  K 4.0 4.2  CL 99 110  CO2 23 24  GLUCOSE 105* 98  BUN 13 10  CREATININE 1.13* 0.86  CALCIUM 9.1 8.9   GFR: Estimated Creatinine Clearance: 72.8 mL/min (by C-G formula based on SCr of 0.86 mg/dL). Liver Function Tests: No results for input(s): AST, ALT, ALKPHOS, BILITOT, PROT, ALBUMIN in the last 168 hours. No results for input(s): LIPASE, AMYLASE in the last 168 hours. No results for input(s): AMMONIA in the last 168 hours. Coagulation Profile: No results for input(s): INR, PROTIME in the last 168 hours. Cardiac Enzymes: No results for input(s): CKTOTAL, CKMB, CKMBINDEX, TROPONINI in the last 168 hours. BNP (last 3 results) No results for input(s): PROBNP in the last 8760 hours. HbA1C: No results for input(s): HGBA1C in the last 72 hours. CBG: No results for input(s): GLUCAP in the last 168 hours. Lipid Profile: No results for input(s): CHOL, HDL, LDLCALC, TRIG, CHOLHDL, LDLDIRECT in the last 72 hours. Thyroid Function Tests: No results for input(s): TSH, T4TOTAL, FREET4, T3FREE, THYROIDAB in the last 72 hours. Anemia Panel: No results for input(s): VITAMINB12, FOLATE, FERRITIN, TIBC, IRON, RETICCTPCT in the last 72 hours. Sepsis Labs: No results for input(s): PROCALCITON, LATICACIDVEN in the last 168 hours.  Recent Results (from the past 240 hour(s))  SARS CORONAVIRUS 2 (TAT 6-24 HRS) Nasopharyngeal Nasopharyngeal Swab     Status: None   Collection Time: 01/24/20 12:49 AM   Specimen: Nasopharyngeal Swab  Result  Value Ref Range Status   SARS Coronavirus 2 NEGATIVE NEGATIVE Final    Comment: (NOTE) SARS-CoV-2 target nucleic acids are NOT DETECTED. The SARS-CoV-2 RNA is generally detectable in upper and lower respiratory specimens during the acute phase of infection. Negative results do not preclude SARS-CoV-2 infection, do not rule out co-infections with other pathogens, and should not be used as the sole basis for treatment or other patient management decisions. Negative results must be combined with clinical observations, patient history, and epidemiological information. The expected result is Negative. Fact Sheet for Patients: https://www.fda.gov/media/138098/download Fact Sheet for Healthcare Providers: https://www.fda.gov/media/138095/download This test is not yet approved or cleared by the [place] FDA and  has been authorized for detection and/or diagnosis of SARS-CoV-2 by FDA under an Emergency   Use Authorization (EUA). This EUA will remain  in effect (meaning this test can be used) for the duration of the COVID-19 declaration under Section 56 4(b)(1) of the Act, 21 U.S.C. section 360bbb-3(b)(1), unless the authorization is terminated or revoked sooner. Performed at [facility], [address] , [place] [address]       Radiology Studies: NM GI Blood Loss  Result Date: 01/25/2020 CLINICAL DATA:  GI bleed.  Patient on blood thinners.  Bloody BM. EXAM: NUCLEAR MEDICINE GASTROINTESTINAL BLEEDING SCAN TECHNIQUE: Sequential abdominal images were obtained following intravenous administration of Tc-99m labeled red blood cells. RADIOPHARMACEUTICALS:  22.8 mCi Tc-99m pertechnetate in-vitro labeled red cells. COMPARISON:  None. FINDINGS: During the first hour of imaging there is a focal area of increased radiotracer uptake localizing to the right colon. Over time, this area increases in intensity. On the second hour there is antegrade progression of the radiopharmaceutical  conforming to the expected course of the ascending colon, transverse colon and descending colon. Radiotracer activity extends to the distal descending colon. IMPRESSION: 1. Examination is positive for active GI bleed. The source of the bleed is felt to be proximal right colon. 2. Critical Value/emergent results were called by telephone at the time of interpretation on 01/25/2020 at 5:11 pm to provider   , who verbally acknowledged these results. Electronically Signed   By: Taylor  Stroud M.D.   On: 01/25/2020 17:11   IR Angiogram Visceral Selective  Result Date: 01/26/2020 INDICATION: 64-year-old female status post treatment of intracranial aneurysms with flow diverting (pipeline) stents at the beginning of the month. She has been on Brilinta and aspirin since that time and cannot, the dual antiplatelet therapy for fear of thrombosis of the recently placed flow diverting stents which could result in a catastrophic stroke. She was recently diagnosed with ascending diverticulitis, was treated and discharged on 01/18/2020. She now presents with lower GI bleeding and acute on chronic anemia requiring transfusion. Nuclear medicine tagged red blood cell bleeding study localizes the bleeding to the ascending colon. She now presents for angiography and possible embolization. EXAM: IR ULTRASOUND GUIDANCE VASC ACCESS RIGHT; SELECTIVE VISCERAL ARTERIOGRAPHY; ADDITIONAL ARTERIOGRAPHY 1. Ultrasound-guided vascular access left common femoral artery 2. Catheterization of the celiac artery with arteriogram 3. Catheterization the superior mesenteric artery with arteriogram 4. Catheterization of the middle colic artery with arteriogram 5. Catheterization of the right colic artery with arteriogram 6. Catheterization of ileal branch with arteriogram MEDICATIONS: None ANESTHESIA/SEDATION: Moderate (conscious) sedation was employed during this procedure. A total of Versed 5 mg and Fentanyl 150 mcg was administered  intravenously. Moderate Sedation Time: 49 minutes. The patient's level of consciousness and vital signs were monitored continuously by radiology nursing throughout the procedure under my direct supervision. CONTRAST:  60mL OMNIPAQUE IOHEXOL 300 MG/ML SOLN, 20mL OMNIPAQUE IOHEXOL 300 MG/ML SOLN, 20mL OMNIPAQUE IOHEXOL 300 MG/ML SOLN FLUOROSCOPY TIME:  Fluoroscopy Time: 6 minutes 48 seconds (2151 mGy). COMPLICATIONS: None immediate. PROCEDURE: Informed consent was obtained from the patient following explanation of the procedure, risks, benefits and alternatives. The patient understands, agrees and consents for the procedure. All questions were addressed. A time out was performed prior to the initiation of the procedure. Maximal barrier sterile technique utilized including caps, mask, sterile gowns, sterile gloves, large sterile drape, hand hygiene, and Betadine prep. The right groin was interrogated with ultrasound. The common femoral artery is patent. There is a metallic clip anterior to the common femoral artery as well as extensive thickening of the soft tissues over the   common femoral artery. An attempt was made to puncture the artery, however the inflammatory changes are extremely scirrhous and it was difficult to pass the needle. Therefore, the decision was made to proceed with a left common femoral access. The left common femoral artery was interrogated with ultrasound and found to be widely patent. An image was obtained and stored for the medical record. Local anesthesia was attained by infiltration with 1% lidocaine. A small dermatotomy was made. Under real-time sonographic guidance, the vessel was punctured with a 21 gauge micropuncture needle. Using standard technique, the initial micro needle was exchanged over a 0.018 micro wire for a transitional 4 [place] micro sheath. The micro sheath was then exchanged over a 0.035 wire for a 5 French vascular sheath. A C2 cobra catheter was advanced over a Bentson  wire into the abdominal aorta. The catheter was used to select the celiac axis. A celiac arteriogram was performed. Conventional celiac artery anatomy. No evidence of replaced middle colic artery. No evidence of active bleeding. The C2 cobra catheter was then used to select the superior mesenteric artery. Arteriography was performed. There appears to be a focus of blush or hyperemia in the right hemiabdomen in the region of the ascending colon. The decision was made to pursue more selective catheterization. Therefore, a renegade STC microcatheter was advanced over a Fathom 16 wire. An initial branch was selected and arteriography performed. This is an ileal branch artery. No evidence of active bleeding. The microcatheter was brought back into the main SMA. The next branch selected was the middle colic artery. Arteriography was performed with excellent opacification of the hepatic flexure of the. No evidence of vascular abnormality or active bleeding. The next artery selected was the right colic artery. Arteriography was performed. There is a several cm segment of hyperemia in the mid ascending colon. However, there is no evidence of active bleeding or arteriovenous malformation. Microcatheter was advanced more distally in the right colic artery an additional magnified arteriography was performed confirming regional hyperemia without evidence of active hemorrhage. At this point, mild Gel-Foam embolization of the region was considered. However, this is a fairly large region of the colon and such embolization could result in ischemic colitis or delay healing of the underlying colitis. Given that there is no active bleeding at this time and that the patient is hemodynamically stable, no embolization was performed. The catheters were removed. Hemostasis was attained with the assistance of a 6 French Angio-Seal device. The patient tolerated the procedure well. IMPRESSION: 1. No active bleeding at the time of  arteriography. 2. There is a several cm segment of regional hyperemia in the mid ascending colon which corresponds with the site of active colitis on the prior CT scan. This is almost certainly the source of the patient's bleeding and either represents recurrent active colitis, or potentially an underlying colonic neoplasm. PLAN: 1. Embolization was not performed at this time as embolization of such a large region of hyperemia could result in ischemic colitis and further complication. Regional embolization could be considered if the patient has a life-threatening bleeding event or becomes otherwise unstable. 2. Recommend treatment for active colitis to be followed by colonoscopy to exclude an underlying neoplastic process in the region. 3. Continue to trend H and H and transfuse as needed. Bleeding should cease once the region of active colitis resolves. Signed, Criselda Peaches, MD, [facility] Radiology Electronically Signed   By: Jacqulynn Cadet M.D.   On: 01/26/2020 08:41  IR Angiogram Visceral Selective  Result Date: 01/26/2020 INDICATION: 64 year old female status post treatment of intracranial aneurysms with flow diverting (pipeline) stents at the beginning of the month. She has been on Brilinta and aspirin since that time and cannot, the dual antiplatelet therapy for fear of thrombosis of the recently placed flow diverting stents which could result in a catastrophic stroke. She was recently diagnosed with ascending diverticulitis, was treated and discharged on 01/18/2020. She now presents with lower GI bleeding and acute on chronic anemia requiring transfusion. Nuclear medicine tagged red blood cell bleeding study localizes the bleeding to the ascending colon. She now presents for angiography and possible embolization. EXAM: IR ULTRASOUND GUIDANCE VASC ACCESS RIGHT; SELECTIVE VISCERAL ARTERIOGRAPHY; ADDITIONAL ARTERIOGRAPHY 1. Ultrasound-guided  vascular access left common femoral artery 2. Catheterization of the celiac artery with arteriogram 3. Catheterization the superior mesenteric artery with arteriogram 4. Catheterization of the middle colic artery with arteriogram 5. Catheterization of the right colic artery with arteriogram 6. Catheterization of ileal branch with arteriogram MEDICATIONS: None ANESTHESIA/SEDATION: Moderate (conscious) sedation was employed during this procedure. A total of Versed 5 mg and Fentanyl 150 mcg was administered intravenously. Moderate Sedation Time: 49 minutes. The patient's level of consciousness and vital signs were monitored continuously by radiology nursing throughout the procedure under my direct supervision. CONTRAST:  98m OMNIPAQUE IOHEXOL 300 MG/ML SOLN, 255mOMNIPAQUE IOHEXOL 300 MG/ML SOLN, 2056mMNIPAQUE IOHEXOL 300 MG/ML SOLN FLUOROSCOPY TIME:  Fluoroscopy Time: 6 minutes 48 seconds (2151 mGy). COMPLICATIONS: None immediate. PROCEDURE: Informed consent was obtained from the patient following explanation of the procedure, risks, benefits and alternatives. The patient understands, agrees and consents for the procedure. All questions were addressed. A time out was performed prior to the initiation of the procedure. Maximal barrier sterile technique utilized including caps, mask, sterile gowns, sterile gloves, large sterile drape, hand hygiene, and Betadine prep. The right groin was interrogated with ultrasound. The common femoral artery is patent. There is a metallic clip anterior to the common femoral artery as well as extensive thickening of the soft tissues over the common femoral artery. An attempt was made to puncture the artery, however the inflammatory changes are extremely scirrhous and it was difficult to pass the needle. Therefore, the decision was made to proceed with a left common femoral access. The left common femoral artery was interrogated with ultrasound and found to be widely patent. An image  was obtained and stored for the medical record. Local anesthesia was attained by infiltration with 1% lidocaine. A small dermatotomy was made. Under real-time sonographic guidance, the vessel was punctured with a 21 gauge micropuncture needle. Using standard technique, the initial micro needle was exchanged over a 0.018 micro wire for a transitional 4 [place] sheath. The micro sheath was then exchanged over a 0.035 wire for a 5 French vascular sheath. A C2 cobra catheter was advanced over a Bentson wire into the abdominal aorta. The catheter was used to select the celiac axis. A celiac arteriogram was performed. Conventional celiac artery anatomy. No evidence of replaced middle colic artery. No evidence of active bleeding. The C2 cobra catheter was then used to select the superior mesenteric artery. Arteriography was performed. There appears to be a focus of blush or hyperemia in the right hemiabdomen in the region of the ascending colon. The decision was made to pursue more selective catheterization. Therefore, a renegade STC microcatheter was advanced over a Fathom 16 wire. An initial branch was selected and arteriography performed. This is an ileal branch  artery. No evidence of active bleeding. The microcatheter was brought back into the main SMA. The next branch selected was the middle colic artery. Arteriography was performed with excellent opacification of the hepatic flexure of the. No evidence of vascular abnormality or active bleeding. The next artery selected was the right colic artery. Arteriography was performed. There is a several cm segment of hyperemia in the mid ascending colon. However, there is no evidence of active bleeding or arteriovenous malformation. Microcatheter was advanced more distally in the right colic artery an additional magnified arteriography was performed confirming regional hyperemia without evidence of active hemorrhage. At this point, mild Gel-Foam embolization of the  region was considered. However, this is a fairly large region of the colon and such embolization could result in ischemic colitis or delay healing of the underlying colitis. Given that there is no active bleeding at this time and that the patient is hemodynamically stable, no embolization was performed. The catheters were removed. Hemostasis was attained with the assistance of a 6 French Angio-Seal device. The patient tolerated the procedure well. IMPRESSION: 1. No active bleeding at the time of arteriography. 2. There is a several cm segment of regional hyperemia in the mid ascending colon which corresponds with the site of active colitis on the prior CT scan. This is almost certainly the source of the patient's bleeding and either represents recurrent active colitis, or potentially an underlying colonic neoplasm. PLAN: 1. Embolization was not performed at this time as embolization of such a large region of hyperemia could result in ischemic colitis and further complication. Regional embolization could be considered if the patient has a life-threatening bleeding event or becomes otherwise unstable. 2. Recommend treatment for active colitis to be followed by colonoscopy to exclude an underlying neoplastic process in the region. 3. Continue to trend H and H and transfuse as needed. Bleeding should cease once the region of active colitis resolves. Signed, Criselda Peaches, MD, [facility] Radiology Electronically Signed   By: Jacqulynn Cadet M.D.   On: 01/26/2020 08:41   IR Angiogram Selective Each Additional Vessel  Result Date: 01/26/2020 INDICATION: 64 year old female status post treatment of intracranial aneurysms with flow diverting (pipeline) stents at the beginning of the month. She has been on Brilinta and aspirin since that time and cannot, the dual antiplatelet therapy for fear of thrombosis of the recently placed flow diverting stents which  could result in a catastrophic stroke. She was recently diagnosed with ascending diverticulitis, was treated and discharged on 01/18/2020. She now presents with lower GI bleeding and acute on chronic anemia requiring transfusion. Nuclear medicine tagged red blood cell bleeding study localizes the bleeding to the ascending colon. She now presents for angiography and possible embolization. EXAM: IR ULTRASOUND GUIDANCE VASC ACCESS RIGHT; SELECTIVE VISCERAL ARTERIOGRAPHY; ADDITIONAL ARTERIOGRAPHY 1. Ultrasound-guided vascular access left common femoral artery 2. Catheterization of the celiac artery with arteriogram 3. Catheterization the superior mesenteric artery with arteriogram 4. Catheterization of the middle colic artery with arteriogram 5. Catheterization of the right colic artery with arteriogram 6. Catheterization of ileal branch with arteriogram MEDICATIONS: None ANESTHESIA/SEDATION: Moderate (conscious) sedation was employed during this procedure. A total of Versed 5 mg and Fentanyl 150 mcg was administered intravenously. Moderate Sedation Time: 49 minutes. The patient's level of consciousness and vital signs were monitored continuously by radiology nursing throughout the procedure under my direct supervision. CONTRAST:  5m OMNIPAQUE IOHEXOL 300 MG/ML SOLN, 2101mOMNIPAQUE IOHEXOL 300 MG/ML SOLN, 2056mMNIPAQUE IOHEXOL  300 MG/ML SOLN FLUOROSCOPY TIME:  Fluoroscopy Time: 6 minutes 48 seconds (2151 mGy). COMPLICATIONS: None immediate. PROCEDURE: Informed consent was obtained from the patient following explanation of the procedure, risks, benefits and alternatives. The patient understands, agrees and consents for the procedure. All questions were addressed. A time out was performed prior to the initiation of the procedure. Maximal barrier sterile technique utilized including caps, mask, sterile gowns, sterile gloves, large sterile drape, hand hygiene, and Betadine prep. The right groin was interrogated with  ultrasound. The common femoral artery is patent. There is a metallic clip anterior to the common femoral artery as well as extensive thickening of the soft tissues over the common femoral artery. An attempt was made to puncture the artery, however the inflammatory changes are extremely scirrhous and it was difficult to pass the needle. Therefore, the decision was made to proceed with a left common femoral access. The left common femoral artery was interrogated with ultrasound and found to be widely patent. An image was obtained and stored for the medical record. Local anesthesia was attained by infiltration with 1% lidocaine. A small dermatotomy was made. Under real-time sonographic guidance, the vessel was punctured with a 21 gauge micropuncture needle. Using standard technique, the initial micro needle was exchanged over a 0.018 micro wire for a transitional 4 French micro sheath. The micro sheath was then exchanged over a 0.035 wire for a 5 French vascular sheath. A C2 cobra catheter was advanced over a Bentson wire into the abdominal aorta. The catheter was used to select the celiac axis. A celiac arteriogram was performed. Conventional celiac artery anatomy. No evidence of replaced middle colic artery. No evidence of active bleeding. The C2 cobra catheter was then used to select the superior mesenteric artery. Arteriography was performed. There appears to be a focus of blush or hyperemia in the right hemiabdomen in the region of the ascending colon. The decision was made to pursue more selective catheterization. Therefore, a renegade STC microcatheter was advanced over a Fathom 16 wire. An initial branch was selected and arteriography performed. This is an ileal branch artery. No evidence of active bleeding. The microcatheter was brought back into the main SMA. The next branch selected was the middle colic artery. Arteriography was performed with excellent opacification of the hepatic flexure of the. No  evidence of vascular abnormality or active bleeding. The next artery selected was the right colic artery. Arteriography was performed. There is a several cm segment of hyperemia in the mid ascending colon. However, there is no evidence of active bleeding or arteriovenous malformation. Microcatheter was advanced more distally in the right colic artery an additional magnified arteriography was performed confirming regional hyperemia without evidence of active hemorrhage. At this point, mild Gel-Foam embolization of the region was considered. However, this is a fairly large region of the colon and such embolization could result in ischemic colitis or delay healing of the underlying colitis. Given that there is no active bleeding at this time and that the patient is hemodynamically stable, no embolization was performed. The catheters were removed. Hemostasis was attained with the assistance of a 6 French Angio-Seal device. The patient tolerated the procedure well. IMPRESSION: 1. No active bleeding at the time of arteriography. 2. There is a several cm segment of regional hyperemia in the mid ascending colon which corresponds with the site of active colitis on the prior CT scan. This is almost certainly the source of the patient's bleeding and either represents recurrent active colitis, or potentially an   underlying colonic neoplasm. PLAN: 1. Embolization was not performed at this time as embolization of such a large region of hyperemia could result in ischemic colitis and further complication. Regional embolization could be considered if the patient has a life-threatening bleeding event or becomes otherwise unstable. 2. Recommend treatment for active colitis to be followed by colonoscopy to exclude an underlying neoplastic process in the region. 3. Continue to trend H and H and transfuse as needed. Bleeding should cease once the region of active colitis resolves. Signed, Heath K. McCullough, MD, RPVI [facility] Electronically Signed   By: Heath  McCullough M.D.   On: 01/26/2020 08:41   IR Angiogram Selective Each Additional Vessel  Result Date: 01/26/2020 INDICATION: 64-year-old female status post treatment of intracranial aneurysms with flow diverting (pipeline) stents at the beginning of the month. She has been on Brilinta and aspirin since that time and cannot, the dual antiplatelet therapy for fear of thrombosis of the recently placed flow diverting stents which could result in a catastrophic stroke. She was recently diagnosed with ascending diverticulitis, was treated and discharged on 01/18/2020. She now presents with lower GI bleeding and acute on chronic anemia requiring transfusion. Nuclear medicine tagged red blood cell bleeding study localizes the bleeding to the ascending colon. She now presents for angiography and possible embolization. EXAM: IR ULTRASOUND GUIDANCE VASC ACCESS RIGHT; SELECTIVE VISCERAL ARTERIOGRAPHY; ADDITIONAL ARTERIOGRAPHY 1. Ultrasound-guided vascular access left common femoral artery 2. Catheterization of the celiac artery with arteriogram 3. Catheterization the superior mesenteric artery with arteriogram 4. Catheterization of the middle colic artery with arteriogram 5. Catheterization of the right colic artery with arteriogram 6. Catheterization of ileal branch with arteriogram MEDICATIONS: None ANESTHESIA/SEDATION: Moderate (conscious) sedation was employed during this procedure. A total of Versed 5 mg and Fentanyl 150 mcg was administered intravenously. Moderate Sedation Time: 49 minutes. The patient's level of consciousness and vital signs were monitored continuously by radiology nursing throughout the procedure under my direct supervision. CONTRAST:  60mL OMNIPAQUE IOHEXOL 300 MG/ML SOLN, 20mL OMNIPAQUE IOHEXOL 300 MG/ML SOLN, 20mL OMNIPAQUE IOHEXOL 300 MG/ML SOLN FLUOROSCOPY TIME:  Fluoroscopy Time: 6 minutes 48 seconds  (2151 mGy). COMPLICATIONS: None immediate. PROCEDURE: Informed consent was obtained from the patient following explanation of the procedure, risks, benefits and alternatives. The patient understands, agrees and consents for the procedure. All questions were addressed. A time out was performed prior to the initiation of the procedure. Maximal barrier sterile technique utilized including caps, mask, sterile gowns, sterile gloves, large sterile drape, hand hygiene, and Betadine prep. The right groin was interrogated with ultrasound. The common femoral artery is patent. There is a metallic clip anterior to the common femoral artery as well as extensive thickening of the soft tissues over the common femoral artery. An attempt was made to puncture the artery, however the inflammatory changes are extremely scirrhous and it was difficult to pass the needle. Therefore, the decision was made to proceed with a left common femoral access. The left common femoral artery was interrogated with ultrasound and found to be widely patent. An image was obtained and stored for the medical record. Local anesthesia was attained by infiltration with 1% lidocaine. A small dermatotomy was made. Under real-time sonographic guidance, the vessel was punctured with a 21 gauge micropuncture needle. Using standard technique, the initial micro needle was exchanged over a 0.018 micro wire for a transitional 4 French micro sheath. The micro sheath was then exchanged over a 0.035 wire for a 5   French vascular sheath. A C2 cobra catheter was advanced over a Bentson wire into the abdominal aorta. The catheter was used to select the celiac axis. A celiac arteriogram was performed. Conventional celiac artery anatomy. No evidence of replaced middle colic artery. No evidence of active bleeding. The C2 cobra catheter was then used to select the superior mesenteric artery. Arteriography was performed. There appears to be a focus of blush or hyperemia in the  right hemiabdomen in the region of the ascending colon. The decision was made to pursue more selective catheterization. Therefore, a renegade STC microcatheter was advanced over a Fathom 16 wire. An initial branch was selected and arteriography performed. This is an ileal branch artery. No evidence of active bleeding. The microcatheter was brought back into the main SMA. The next branch selected was the middle colic artery. Arteriography was performed with excellent opacification of the hepatic flexure of the. No evidence of vascular abnormality or active bleeding. The next artery selected was the right colic artery. Arteriography was performed. There is a several cm segment of hyperemia in the mid ascending colon. However, there is no evidence of active bleeding or arteriovenous malformation. Microcatheter was advanced more distally in the right colic artery an additional magnified arteriography was performed confirming regional hyperemia without evidence of active hemorrhage. At this point, mild Gel-Foam embolization of the region was considered. However, this is a fairly large region of the colon and such embolization could result in ischemic colitis or delay healing of the underlying colitis. Given that there is no active bleeding at this time and that the patient is hemodynamically stable, no embolization was performed. The catheters were removed. Hemostasis was attained with the assistance of a 6 French Angio-Seal device. The patient tolerated the procedure well. IMPRESSION: 1. No active bleeding at the time of arteriography. 2. There is a several cm segment of regional hyperemia in the mid ascending colon which corresponds with the site of active colitis on the prior CT scan. This is almost certainly the source of the patient's bleeding and either represents recurrent active colitis, or potentially an underlying colonic neoplasm. PLAN: 1. Embolization was not performed at this time as embolization of such a  large region of hyperemia could result in ischemic colitis and further complication. Regional embolization could be considered if the patient has a life-threatening bleeding event or becomes otherwise unstable. 2. Recommend treatment for active colitis to be followed by colonoscopy to exclude an underlying neoplastic process in the region. 3. Continue to trend H and H and transfuse as needed. Bleeding should cease once the region of active colitis resolves. Signed, Heath K. McCullough, MD, RPVI [facility] Electronically Signed   By: Heath  McCullough M.D.   On: 01/26/2020 08:41   IR US Guide Vasc Access Right  Result Date: 01/26/2020 INDICATION: 64-year-old female status post treatment of intracranial aneurysms with flow diverting (pipeline) stents at the beginning of the month. She has been on Brilinta and aspirin since that time and cannot, the dual antiplatelet therapy for fear of thrombosis of the recently placed flow diverting stents which could result in a catastrophic stroke. She was recently diagnosed with ascending diverticulitis, was treated and discharged on 01/18/2020. She now presents with lower GI bleeding and acute on chronic anemia requiring transfusion. Nuclear medicine tagged red blood cell bleeding study localizes the bleeding to the ascending colon. She now presents for angiography and possible embolization. EXAM: IR ULTRASOUND GUIDANCE VASC ACCESS RIGHT; SELECTIVE VISCERAL ARTERIOGRAPHY;   ADDITIONAL ARTERIOGRAPHY 1. Ultrasound-guided vascular access left common femoral artery 2. Catheterization of the celiac artery with arteriogram 3. Catheterization the superior mesenteric artery with arteriogram 4. Catheterization of the middle colic artery with arteriogram 5. Catheterization of the right colic artery with arteriogram 6. Catheterization of ileal branch with arteriogram MEDICATIONS: None ANESTHESIA/SEDATION: Moderate (conscious)  sedation was employed during this procedure. A total of Versed 5 mg and Fentanyl 150 mcg was administered intravenously. Moderate Sedation Time: 49 minutes. The patient's level of consciousness and vital signs were monitored continuously by radiology nursing throughout the procedure under my direct supervision. CONTRAST:  77m OMNIPAQUE IOHEXOL 300 MG/ML SOLN, 259mOMNIPAQUE IOHEXOL 300 MG/ML SOLN, 2056mMNIPAQUE IOHEXOL 300 MG/ML SOLN FLUOROSCOPY TIME:  Fluoroscopy Time: 6 minutes 48 seconds (2151 mGy). COMPLICATIONS: None immediate. PROCEDURE: Informed consent was obtained from the patient following explanation of the procedure, risks, benefits and alternatives. The patient understands, agrees and consents for the procedure. All questions were addressed. A time out was performed prior to the initiation of the procedure. Maximal barrier sterile technique utilized including caps, mask, sterile gowns, sterile gloves, large sterile drape, hand hygiene, and Betadine prep. The right groin was interrogated with ultrasound. The common femoral artery is patent. There is a metallic clip anterior to the common femoral artery as well as extensive thickening of the soft tissues over the common femoral artery. An attempt was made to puncture the artery, however the inflammatory changes are extremely scirrhous and it was difficult to pass the needle. Therefore, the decision was made to proceed with a left common femoral access. The left common femoral artery was interrogated with ultrasound and found to be widely patent. An image was obtained and stored for the medical record. Local anesthesia was attained by infiltration with 1% lidocaine. A small dermatotomy was made. Under real-time sonographic guidance, the vessel was punctured with a 21 gauge micropuncture needle. Using standard technique, the initial micro needle was exchanged over a 0.018 micro wire for a transitional 4 Fre[employer]ancro sheath. The micro sheath was then  exchanged over a 0.035 wire for a 5 French vascular sheath. A C2 cobra catheter was advanced over a Bentson wire into the abdominal aorta. The catheter was used to select the celiac axis. A celiac arteriogram was performed. Conventional celiac artery anatomy. No evidence of replaced middle colic artery. No evidence of active bleeding. The C2 cobra catheter was then used to select the superior mesenteric artery. Arteriography was performed. There appears to be a focus of blush or hyperemia in the right hemiabdomen in the region of the ascending colon. The decision was made to pursue more selective catheterization. Therefore, a renegade STC microcatheter was advanced over a Fathom 16 wire. An initial branch was selected and arteriography performed. This is an ileal branch artery. No evidence of active bleeding. The microcatheter was brought back into the main SMA. The next branch selected was the middle colic artery. Arteriography was performed with excellent opacification of the hepatic flexure of the. No evidence of vascular abnormality or active bleeding. The next artery selected was the right colic artery. Arteriography was performed. There is a several cm segment of hyperemia in the mid ascending colon. However, there is no evidence of active bleeding or arteriovenous malformation. Microcatheter was advanced more distally in the right colic artery an additional magnified arteriography was performed confirming regional hyperemia without evidence of active hemorrhage. At this point, mild Gel-Foam embolization of the region was considered. However, this is a fairly large region  of the colon and such embolization could result in ischemic colitis or delay healing of the underlying colitis. Given that there is no active bleeding at this time and that the patient is hemodynamically stable, no embolization was performed. The catheters were removed. Hemostasis was attained with the assistance of a 6 French Angio-Seal  device. The patient tolerated the procedure well. IMPRESSION: 1. No active bleeding at the time of arteriography. 2. There is a several cm segment of regional hyperemia in the mid ascending colon which corresponds with the site of active colitis on the prior CT scan. This is almost certainly the source of the patient's bleeding and either represents recurrent active colitis, or potentially an underlying colonic neoplasm. PLAN: 1. Embolization was not performed at this time as embolization of such a large region of hyperemia could result in ischemic colitis and further complication. Regional embolization could be considered if the patient has a life-threatening bleeding event or becomes otherwise unstable. 2. Recommend treatment for active colitis to be followed by colonoscopy to exclude an underlying neoplastic process in the region. 3. Continue to trend H and H and transfuse as needed. Bleeding should cease once the region of active colitis resolves. Signed, Criselda Peaches, MD, [place] [facility] Electronically Signed   By: Jacqulynn Cadet M.D.   On: 01/26/2020 08:41      Scheduled Meds: . sodium chloride   Intravenous Once  . aspirin EC  81 mg Oral Daily  . bisacodyl  20 mg Oral Once  . metoCLOPramide (REGLAN) injection  10 mg Intravenous Once  . metoCLOPramide (REGLAN) injection  10 mg Intravenous Once  . pantoprazole  40 mg Oral Daily  . peg 3350 powder  0.5 kit Oral Once   And  . peg 3350 powder  0.5 kit Oral Once  . pravastatin  40 mg Oral Daily  . ticagrelor  90 mg Oral BID   Continuous Infusions:   LOS: 2 days      Time spent: 25 minutes   [name], DO [facility] 01/26/2020, 11:41 AM   Available via Epic secure chat 7am-7pm After these hours, please refer to coverage provider listed on [email or web address]

## 2020-01-26 NOTE — Progress Notes (Signed)
   01/26/20 1712  Vitals  Temp 98.9 F (37.2 C)  BP (!) 141/81  MAP (mmHg) 98  BP Location Right Arm  BP Method Automatic  Patient Position (if appropriate) Lying  Pulse Rate 93  Pulse Rate Source Monitor  Resp 20  Pt is complaining of light headedness 10 mins after administration of IV Reglan. VS measured and recorded. CBG is  81. No changes in the orientation status or consciousness. MD is paged via secure chat. Pt is to start bowel prep for scheduled colonoscopy tomorrow. Unable to tolerate right now. Pt was transferred from chair to bed. Resting comfortably. Family at bedside.

## 2020-01-27 ENCOUNTER — Encounter (HOSPITAL_COMMUNITY): Payer: Self-pay | Admitting: Internal Medicine

## 2020-01-27 ENCOUNTER — Encounter (HOSPITAL_COMMUNITY)
Admission: EM | Disposition: A | Discharge status: Home / Self Care | Payer: Self-pay | Source: Home / Self Care | Attending: Internal Medicine

## 2020-01-27 ENCOUNTER — Inpatient Hospital Stay (HOSPITAL_COMMUNITY): Payer: BC Managed Care – PPO

## 2020-01-27 ENCOUNTER — Inpatient Hospital Stay (HOSPITAL_COMMUNITY): Payer: BC Managed Care – PPO | Admitting: Registered Nurse

## 2020-01-27 DIAGNOSIS — K921 Melena: Secondary | ICD-10-CM

## 2020-01-27 DIAGNOSIS — I10 Essential (primary) hypertension: Secondary | ICD-10-CM

## 2020-01-27 DIAGNOSIS — K5790 Diverticulosis of intestine, part unspecified, without perforation or abscess without bleeding: Secondary | ICD-10-CM

## 2020-01-27 DIAGNOSIS — D490 Neoplasm of unspecified behavior of digestive system: Secondary | ICD-10-CM

## 2020-01-27 DIAGNOSIS — I671 Cerebral aneurysm, nonruptured: Secondary | ICD-10-CM

## 2020-01-27 DIAGNOSIS — K219 Gastro-esophageal reflux disease without esophagitis: Secondary | ICD-10-CM

## 2020-01-27 HISTORY — PX: BIOPSY: SHX5522

## 2020-01-27 HISTORY — PX: COLONOSCOPY WITH PROPOFOL: SHX5780

## 2020-01-27 LAB — HEMOGLOBIN AND HEMATOCRIT, BLOOD
HCT: 29.8 % — ABNORMAL LOW (ref 36.0–46.0)
HCT: 26.6 % — ABNORMAL LOW (ref 36.0–46.0)
Hemoglobin: 9.3 g/dL — ABNORMAL LOW (ref 12.0–15.0)
Hemoglobin: 8.7 g/dL — ABNORMAL LOW (ref 12.0–15.0)

## 2020-01-27 LAB — HEMOGLOBIN: Hemoglobin: 7.7 g/dL — ABNORMAL LOW (ref 12.0–15.0)

## 2020-01-27 SURGERY — COLONOSCOPY WITH PROPOFOL
Anesthesia: Monitor Anesthesia Care

## 2020-01-27 MED ORDER — TICAGRELOR 90 MG PO TABS
90.0000 mg | ORAL_TABLET | Freq: Two times a day (BID) | ORAL | Status: DC
  Administered 2020-01-27 – 2020-01-29 (×4): 90 mg via ORAL
  Filled 2020-01-27 (×5): qty 1

## 2020-01-27 MED ORDER — IOHEXOL 300 MG/ML  SOLN
75.0000 mL | Freq: Once | INTRAMUSCULAR | Status: AC | PRN
  Administered 2020-01-27: 75 mL via INTRAVENOUS

## 2020-01-27 MED ORDER — PROPOFOL 500 MG/50ML IV EMUL
INTRAVENOUS | Status: DC | PRN
  Administered 2020-01-27: 10 mg via INTRAVENOUS
  Administered 2020-01-27: 20 mg via INTRAVENOUS
  Administered 2020-01-27: 120 ug/kg/min via INTRAVENOUS

## 2020-01-27 MED ORDER — SODIUM CHLORIDE 0.9 % IV SOLN: INTRAVENOUS | Status: DC

## 2020-01-27 MED ORDER — PHENYLEPHRINE 40 MCG/ML (10ML) SYRINGE FOR IV PUSH (FOR BLOOD PRESSURE SUPPORT)
PREFILLED_SYRINGE | INTRAVENOUS | Status: DC | PRN
  Administered 2020-01-27: 40 ug via INTRAVENOUS

## 2020-01-27 MED ORDER — SODIUM CHLORIDE (PF) 0.9 % IJ SOLN
INTRAMUSCULAR | Status: AC
  Filled 2020-01-27: qty 50

## 2020-01-27 MED ORDER — PROPOFOL 500 MG/50ML IV EMUL
INTRAVENOUS | Status: AC
  Filled 2020-01-27: qty 50

## 2020-01-27 MED ORDER — ONDANSETRON HCL 4 MG/2ML IJ SOLN
INTRAMUSCULAR | Status: DC | PRN
  Administered 2020-01-27: 4 mg via INTRAVENOUS

## 2020-01-27 MED ORDER — LACTATED RINGERS IV SOLN
INTRAVENOUS | Status: DC
  Administered 2020-01-27: 1000 mL via INTRAVENOUS

## 2020-01-27 SURGICAL SUPPLY — 22 items

## 2020-01-27 NOTE — Progress Notes (Signed)
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  64 year old female who presented with hematochezia and syncope.  Recent hospitalization for diverticulitis lower GI bleed March 14 of March 23.  She also has hypertension, dyslipidemia and status post left paraclinoid ICA stent placement (12/09/2019, on aspirin and ticagrelor).  Hematochezia, dark red blood in her stools x1 she developed a syncope episode preceded by lightheadedness and weakness.  When EMS arrived her blood pressure was low.  In the emergency department after intravenous fluids her blood pressure was 125/66, heart rate 89, respiratory rate 14, temperature 98.2, oxygen saturation 100%, her lungs are clear to auscultation bilaterally, heart is also present rhythmic, soft abdomen and no lower extremity edema Sodium 132, potassium 4.0, chloride 99, bicarb 23, glucose 105, BUN 13, creatinine 1.13, white count 4.3, hemoglobin 8.0, lateral 25.2, platelets 525.  Head CT negative for acute changes.  EKG 78 bpm, normal axis, normal intervals, sinus rhythm, positive PACs, J-point elevation precordial leads, no ST segment or T wave changes.  Patient received 1 unit packed red blood cells and underwent tagged RBC study which was positive.  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  Assessment & Plan:   Principal Problem:   Acute lower GI bleeding Active Problems:   Cerebral aneurysm   Essential hypertension   Syncope   GERD (gastroesophageal reflux disease)   Diverticulosis   1. Acute blood loss anemia due to lower GI bleed due to NEW colonic mass. Hgb at 8,7 and Hct at 26.6. Patient with no further  significant bleeding, further workup with malignant tumor in the ascending colon.   Will follow up on biopsy report, for now will continue with asa and ticagrelor, follow closely cell count.   2. HTN. Continue holding antihypertensive medications for now. Continue close monitoring.   3. Cerebral aneurysm. Continue asa and ticagrelor. Will need to contact [place], for perioperative recommendations in terms of antiplatelet therapy.   4. Dyslipidemia. Continue with statin therapy.   4. GERD   DVT prophylaxis: scd   Code Status:  full Family Communication: I spoke with patient's husband at the bedside, we talked in detail about patient's condition, plan of care and prognosis and all questions were addressed.  Disposition Plan/ discharge barriers: patient form home, barrier for dc pending work up for malignant colonic mass.    Consultants:   GI   Surgery   Procedures:   Colonoscopy      Subjective: Patient is feeling very anxious, about diagnosis of colonic mass. No nausea or vomiting, no chest pain or dyspnea.   Objective: Vitals:   01/27/20 0620 01/27/20 0733 01/27/20 0841 01/27/20 0850  BP: 110/80 135/82 (!) 99/46 (!) 118/58  Pulse: (!) 106 89 97 93  Resp: 20 20 (!) 26 20  Temp: 98 F (36.7 C) 97.9 F (36.6 C) 98 F (36.7 C)   TempSrc: Oral Oral Axillary   SpO2: 100% 100% 100%   Weight:  84.4 kg    Height:  5\' 7"  (1.702 m)      Intake/Output Summary (Last 24 hours) at 01/27/2020 1302 Last data filed at 01/27/2020 0842 Gross per 24 hour  Intake 420 ml  Output --  Net 420 ml   Filed Weights   01/23/20 2044 01/24/20 0255 01/27/20 0733  Weight: 84.4 kg 82.1 kg 84.4 kg    Examination:   General: Not in pain or dyspnea, deconditioned  Neurology: Awake and alert, non focal  E ENT: mild pallor, no icterus, oral mucosa moist Cardiovascular: No JVD. S1-S2 present, rhythmic, no gallops, rubs, or murmurs. No lower extremity edema. Pulmonary: positive breath  sounds bilaterally, adequate air movement, no wheezing, rhonchi or rales. Gastrointestinal. Abdomen with no organomegaly, non tender, no rebound or guarding Skin. No rashes Musculoskeletal: no joint deformities     Data Reviewed: I have personally reviewed following labs and imaging studies  CBC: Recent Labs  Lab 01/23/20 2112 01/24/20 0459 01/25/20 0339 01/25/20 1128 01/26/20 0451 01/26/20 0451 01/26/20 1058 01/26/20 1809 01/26/20 2337 01/27/20 0543 01/27/20 1154  WBC 4.3  --  3.2*  --  3.7*  --   --   --   --   --   --   NEUTROABS 2.6  --   --   --   --   --   --   --   --   --   --   HGB 8.0*   < > 6.8*   < > 7.5*   < > 8.5*  8.4* 8.1* 8.7* 9.3* 8.7*  HCT 25.2*   < > 22.3*   < > 23.9*   < > 26.9* 25.6* 27.8* 29.8* 26.6*  MCV 84.0  --  85.1  --  85.4  --   --   --   --   --   --   PLT 525*  --  477*  --  434*  --   --   --   --   --   --    < > = values in this interval not displayed.   Basic Metabolic Panel: Recent Labs  Lab 01/23/20 2112 01/25/20 0339  NA 132* 141  K 4.0 4.2  CL 99 110  CO2 23 24  GLUCOSE 105* 98  BUN 13 10  CREATININE 1.13* 0.86  CALCIUM 9.1 8.9   GFR: Estimated Creatinine Clearance: 73.8 mL/min (by C-G formula based on SCr of 0.86 mg/dL). Liver Function Tests: No results for input(s): AST, ALT, ALKPHOS, BILITOT, PROT, ALBUMIN in the last 168 hours. No results for input(s): LIPASE, AMYLASE in the last 168 hours. No results for input(s): AMMONIA in the last 168 hours. Coagulation Profile: No results for input(s): INR, PROTIME in the last 168 hours. Cardiac Enzymes: No results for input(s): CKTOTAL, CKMB, CKMBINDEX, TROPONINI in the last 168 hours. BNP (last 3 results) No results for input(s): PROBNP in the last 8760 hours. HbA1C: No results for input(s): HGBA1C in the last 72 hours. CBG: Recent Labs  Lab 01/26/20 1707  GLUCAP 81   Lipid Profile: No results for input(s): CHOL, HDL, LDLCALC, TRIG, CHOLHDL, LDLDIRECT in the last 72  hours. Thyroid Function Tests: No results for input(s): TSH, T4TOTAL, FREET4, T3FREE, THYROIDAB in the last 72 hours. Anemia Panel: No results for input(s): VITAMINB12, FOLATE, FERRITIN, TIBC, IRON, RETICCTPCT in the last 72 hours.    Radiology Studies: I have reviewed all of the imaging during this hospital visit personally     Scheduled Meds: . aspirin EC  81 mg Oral Daily  . metoCLOPramide (REGLAN) injection  10 mg Intravenous Once  . pantoprazole  40 mg Oral Daily  . pravastatin  40 mg Oral Daily  . ticagrelor  90  mg Oral BID   Continuous Infusions:   LOS: 3 days        Serinity Ware Gerome Apley, MD

## 2020-01-27 NOTE — Transfer of Care (Signed)
Immediate Anesthesia Transfer of Care Note  Patient: Nancy Tran  Procedure(s) Performed: COLONOSCOPY WITH PROPOFOL (N/A ) BIOPSY  Patient Location: PACU and Endoscopy Unit  Anesthesia Type:MAC  Level of Consciousness: awake, alert , oriented and patient cooperative  Airway & Oxygen Therapy: Patient Spontanous Breathing and Patient connected to face mask oxygen  Post-op Assessment: Report given to RN, Post -op Vital signs reviewed and stable and Patient moving all extremities  Post vital signs: Reviewed and stable  Last Vitals:  Vitals Value Taken Time  BP 99/46 01/27/20 0841  Temp    Pulse 97 01/27/20 0842  Resp 23 01/27/20 0842  SpO2 100 % 01/27/20 0842  Vitals shown include unvalidated device data.  Last Pain:  Vitals:   01/27/20 0733  TempSrc: Oral  PainSc: 0-No pain      Patients Stated Pain Goal: 3 (14/97/02 6378)  Complications: No apparent anesthesia complications

## 2020-01-27 NOTE — Op Note (Signed)
[facility] Patient Name: Nancy Tran Procedure Date: 01/27/2020 MRN: [id number] Attending MD: Gatha Mayer , MD Date of Birth: 05-21-1956 CSN: [id number] Age: 64 Admit Type: Inpatient Procedure:                Colonoscopy Indications:              Hematochezia, Abnormal CT of the GI tract Providers:                Gatha Mayer, MD, Cleda Daub, RN, Laverda Sorenson, Technician, Courtney Heys. [place], CRNA Referring MD:              Medicines:                Propofol per Anesthesia, Monitored Anesthesia Care Complications:            No immediate complications. Estimated Blood Loss:     Estimated blood loss was minimal. Procedure:                Pre-Anesthesia Assessment:                           - Prior to the procedure, a History and Physical                            was performed, and patient medications and                            allergies were reviewed. The patient's tolerance of                            previous anesthesia was also reviewed. The risks                            and benefits of the procedure and the sedation                            options and risks were discussed with the patient.                            All questions were answered, and informed consent                            was obtained. Prior Anticoagulants: The patient                            last took antiplatelet medication on the day of the                            procedure. ASA Grade Assessment: III - A patient                            with severe systemic disease. After reviewing the  risks and benefits, the patient was deemed in                            satisfactory condition to undergo the procedure.                           After obtaining informed consent, the colonoscope                            was passed under direct vision. Throughout the                            procedure, the patient's  blood pressure, pulse, and                            oxygen saturations were monitored continuously. The                            [id number]) Olympus colonoscope was                            introduced through the anus and advanced to the the                            cecum, identified by appendiceal orifice and                            ileocecal valve. The colonoscopy was performed                            without difficulty. The patient tolerated the                            procedure well. The quality of the bowel                            preparation was good. The bowel preparation used                            was MoviPrep via split dose instruction. The                            ileocecal valve, appendiceal orifice, and rectum                            were photographed. Scope In: 8:21:55 AM Scope Out: 8:35:40 AM Scope Withdrawal Time: 0 hours 10 minutes 12 seconds  Total Procedure Duration: 0 hours 13 minutes 45 seconds  Findings:      The perianal and digital rectal examinations were normal.      An infiltrative, polypoid and sessile non-obstructing large mass was       found in the ascending colon. The mass was circumferential. Oozing was       present. This was biopsied with a cold forceps for histology.  Verification of patient identification for the specimen was done.       Estimated blood loss was minimal.      Diverticula were found in the sigmoid colon and ascending colon.      The exam was otherwise without abnormality on direct and retroflexion       views. 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                            Biopsied.                           - Diverticulosis in the sigmoid colon and in the                            ascending colon.                           - The examination was otherwise normal on direct                            and retroflexion views. Moderate Sedation:      Not  Applicable - Patient had care per Anesthesia. Recommendation:           - Patient has a contact number available for                            emergencies. The signs and symptoms of potential                            delayed complications were discussed with the                            patient. Return to normal activities tomorrow.                            Written discharge instructions were provided to the                            patient.                           - Clear liquid diet - further advice per surgery                           - Continue present medications.                           - Hold Brilinta                           Stay on ASA ? 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 but do not see how we can leave her                            on Brilinta - ? if there are alternatives like                            heparin                           Spoke to husband and explained as well Procedure Code(s):        --- Professional ---                           [phone], Colonoscopy, flexible; with biopsy, single                            or multiple Diagnosis Code(s):        --- Professional ---                           D49.0, Neoplasm of unspecified behavior of                            digestive system                           K92.1, Melena (includes Hematochezia)                           K57.30, Diverticulosis of large  intestine without                            perforation or abscess without bleeding  R93.3, Abnormal findings on diagnostic imaging of                            other parts of digestive tract CPT copyright 2019 [employer]. All rights reserved. The codes documented in this report are preliminary and upon coder review may  be revised to meet current compliance requirements. Gatha Mayer, MD 01/27/2020 9:18:53 AM This report has been signed electronically. Number of Addenda: 0

## 2020-01-27 NOTE — Interval H&P Note (Signed)
History and Physical Interval Note:  01/27/2020 8:09 AM  Nancy Tran  has presented today for surgery, with the diagnosis of lower GI bleed.  The various methods of treatment have been discussed with the patient and family. After consideration of risks, benefits and other options for treatment, the patient has consented to  Procedure(s): COLONOSCOPY WITH PROPOFOL (N/A) as a surgical intervention.  The patient's history has been reviewed, patient examined, no change in status, stable for surgery.  I have reviewed the patient's chart and labs.  Questions were answered to the patient's satisfaction.     Silvano Rusk

## 2020-01-27 NOTE — Consult Note (Addendum)
[facility] Surgery Consult Note  Kymoni Griebel October 13, 1956  [id number].    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 She has a recent hospitalization (3/14-3/23/21) for presumed ascending diverticulitis w/ LGIB treated non-operatively with cipro/flagyl. She re-presented to the ED 01/23/20 due to a syncopal episode and bloody BMs. She was admitted and underwent colonoscopy today 4/1 by Dr. Carlean Purl where an ascending, circumferential, sessile, non-obstructing colon mass was found. General surgery was asked to consult.   Today the patient states that she had darker stools after starting her anti-platelet therapy end of February (according to the patient her ICA stent was placed 2/24 but in care everywhere it looks like IR did this procedure 3/3). Her first episode of maroon/red bloody BMs was on Sunday 01/23/20. She reports having a normal colonoscopy about 2 years ago. Denies unintentional weight loss. Reports that her father had a history of prostate or colon cancer. She has been tolerating oral intake, having flatus and BMs, denies nausea or vomiting. She denies abdominal pain currently.   Her husband is at bedside, she states that she has 3 children. She is very tearful and concerned about her prognosis.   CEA, CT chest, and surgical path are all pending.   ROS: Review of Systems  Constitutional: Negative for chills and fever.  HENT: Negative.   Eyes: Negative.   Respiratory: Negative.   Cardiovascular: Negative.   Gastrointestinal: Positive for blood in stool.  Genitourinary: Negative.   Musculoskeletal: Negative.   Skin: Negative.   Neurological: Negative for dizziness and headaches.  Psychiatric/Behavioral: Negative.      History reviewed. No pertinent family history.  Past Medical History:   Diagnosis Date  . Adenoma of colon 2007   Adenomatous polyps in 2007, 2012, 2017.  GI MD is [facility] in [place].    . Complication of anesthesia   . Diverticulitis 12/2019  . Gastritis, erosive 08/2018   EGD by Dr Mitchell Heir.    . High cholesterol   . History of cerebral aneurysm    bil internal carotid artery aneurysms.   embollization and stent to left 12/29/19 at [facility].    . Hypertension   . Normocytic anemia 2016  . PONV (postoperative nausea and vomiting)   . Tubal ligation status     Past Surgical History:  Procedure Laterality Date  . BREAST SURGERY    . CEREBRAL ANEURYSM REPAIR  12/29/2019  . COLONOSCOPY    . IR ANGIOGRAM SELECTIVE EACH ADDITIONAL VESSEL  01/25/2020  . IR ANGIOGRAM SELECTIVE EACH ADDITIONAL VESSEL  01/25/2020  . IR ANGIOGRAM VISCERAL SELECTIVE  01/25/2020  . IR ANGIOGRAM VISCERAL SELECTIVE  01/25/2020  . IR US GUIDE VASC ACCESS RIGHT  01/25/2020  . TUBAL LIGATION      Social History:  reports that she has quit smoking. She has never used smokeless tobacco. She reports current alcohol use. She reports that she does not use drugs.  Allergies:  Allergies  Allergen Reactions  . Codeine   . Darvocet [Propoxyphene N-Acetaminophen]   . Lisinopril Other (See Comments)  . Morphine And Related Nausea And Vomiting  . Amoxicillin-Pot Clavulanate Rash    Other reaction(s): RASH Other reaction(s): RASH   . Penicillins Rash    Medications Prior to Admission  Medication Sig Dispense Refill  . aspirin 81 MG EC tablet Take  81 mg by mouth daily.    . baclofen (LIORESAL) 10 MG tablet Take 5-10 mg by mouth every 8 (eight) hours as needed for muscle spasms.    Marland Kitchen BRILINTA 90 MG TABS tablet Take 90 mg by mouth 2 (two) times daily.    Marland Kitchen loratadine (CLARITIN) 10 MG tablet Take 1 tablet (10 mg total) by mouth daily. (Patient taking differently: Take 10 mg by mouth daily as needed for allergies. ) 30 tablet 0  . Multiple Vitamin (MULTIVITAMIN WITH MINERALS) TABS  tablet Take 1 tablet by mouth daily.    Marland Kitchen omeprazole (PRILOSEC) 40 MG capsule Take 40 mg by mouth daily.    . pravastatin (PRAVACHOL) 40 MG tablet Take 40 mg by mouth daily.    Marland Kitchen loperamide (IMODIUM A-D) 2 MG tablet Take 1 tablet (2 mg total) by mouth 4 (four) times daily as needed for diarrhea or loose stools. (Patient not taking: Reported on 01/10/2020) 30 tablet 0  . ondansetron (ZOFRAN ODT) 4 MG disintegrating tablet Take 1 tablet (4 mg total) by mouth every 8 (eight) hours as needed for nausea. (Patient not taking: Reported on 01/10/2020) 10 tablet 0  . promethazine (PHENERGAN) 25 MG tablet Take 1 tablet (25 mg total) by mouth every 6 (six) hours as needed for nausea. (Patient not taking: Reported on 01/10/2020) 12 tablet 0    Blood pressure (!) 118/58, pulse 93, temperature 98 F (36.7 C), temperature source Axillary, resp. rate 20, height 5\' 7"  (1.702 m), weight 84.4 kg, SpO2 100 %. Physical Exam: Physical Exam Constitutional:      Appearance: Normal appearance. She is not ill-appearing or toxic-appearing.  HENT:     Head: Normocephalic and atraumatic.     Right Ear: External ear normal.     Left Ear: External ear normal.     Nose: Nose normal.     Mouth/Throat:     Mouth: Mucous membranes are moist.     Pharynx: Oropharynx is clear. No oropharyngeal exudate.  Eyes:     General: No scleral icterus.       Right eye: No discharge.        Left eye: No discharge.     Extraocular Movements: Extraocular movements intact.     Conjunctiva/sclera: Conjunctivae normal.  Cardiovascular:     Rate and Rhythm: Normal rate and regular rhythm.     Pulses: Normal pulses.  Pulmonary:     Effort: Pulmonary effort is normal. No respiratory distress.     Breath sounds: Normal breath sounds. No wheezing, rhonchi or rales.  Abdominal:     General: There is no distension.     Palpations: Abdomen is soft. There is no mass.     Tenderness: There is no abdominal tenderness. There is no guarding or  rebound.     Hernia: No hernia is present.  Musculoskeletal:        General: No deformity. Normal range of motion.     Cervical back: Normal range of motion and neck supple. No rigidity.     Right lower leg: No edema.     Left lower leg: No edema.  Lymphadenopathy:     Cervical: No cervical adenopathy.  Skin:    General: Skin is warm and dry.     Findings: No lesion or rash.  Neurological:     General: No focal deficit present.     Mental Status: She is alert and oriented to person, place, and time.  Psychiatric:  Mood and Affect: Mood normal.        Behavior: Behavior normal.        Thought Content: Thought content normal.    Results for orders placed or performed during the hospital encounter of 01/23/20 (from the past 48 hour(s))  Hemoglobin and hematocrit, blood     Status: Abnormal   Collection Time: 01/25/20 11:28 AM  Result Value Ref Range   Hemoglobin 8.5 (L) 12.0 - 15.0 g/dL   HCT 25.7 (L) 36.0 - 46.0 %    Comment: Performed at [facility], [place] [address], [place], [place] [address]  CBC     Status: Abnormal   Collection Time: 01/26/20  4:51 AM  Result Value Ref Range   WBC 3.7 (L) 4.0 - 10.5 K/uL   RBC 2.80 (L) 3.87 - 5.11 MIL/uL   Hemoglobin 7.5 (L) 12.0 - 15.0 g/dL   HCT 23.9 (L) 36.0 - 46.0 %   MCV 85.4 80.0 - 100.0 fL   MCH 26.8 26.0 - 34.0 pg   MCHC 31.4 30.0 - 36.0 g/dL   RDW 14.8 11.5 - 15.5 %   Platelets 434 (H) 150 - 400 K/uL   nRBC 0.0 0.0 - 0.2 %    Comment: Performed at [facility], [place] [address], [place], [place] [address]  Hemoglobin     Status: Abnormal   Collection Time: 01/26/20 10:58 AM  Result Value Ref Range   Hemoglobin 8.4 (L) 12.0 - 15.0 g/dL    Comment: Performed at [facility], [place] [address], [place], [place] [address]  Hemoglobin and hematocrit, blood     Status: Abnormal   Collection Time: 01/26/20 10:58 AM  Result Value Ref Range   Hemoglobin 8.5 (L) 12.0 - 15.0  g/dL   HCT 26.9 (L) 36.0 - 46.0 %    Comment: Performed at [facility], [place] [address] [place], [place] [address]  Glucose, capillary     Status: None   Collection Time: 01/26/20  5:07 PM  Result Value Ref Range   Glucose-Capillary 81 70 - 99 mg/dL    Comment: Glucose reference range applies only to samples taken after fasting for at least 8 hours.  Hemoglobin and hematocrit, blood     Status: Abnormal   Collection Time: 01/26/20  6:09 PM  Result Value Ref Range   Hemoglobin 8.1 (L) 12.0 - 15.0 g/dL   HCT 25.6 (L) 36.0 - 46.0 %    Comment: Performed at [facility], [place] [address] [place], [place] [address]  Hemoglobin and hematocrit, blood     Status: Abnormal   Collection Time: 01/26/20 11:37 PM  Result Value Ref Range   Hemoglobin 8.7 (L) 12.0 - 15.0 g/dL   HCT 27.8 (L) 36.0 - 46.0 %    Comment: Performed at [facility], [place] [address] [place], [place] [address]  Hemoglobin and hematocrit, blood     Status: Abnormal   Collection Time: 01/27/20  5:43 AM  Result Value Ref Range   Hemoglobin 9.3 (L) 12.0 - 15.0 g/dL   HCT 29.8 (L) 36.0 - 46.0 %    Comment: Performed at [facility], [place] [address], [place], [place] [address]   NM GI Blood Loss  Result Date: 01/25/2020 CLINICAL DATA:  GI bleed.  Patient on blood thinners.  Bloody BM. EXAM: NUCLEAR MEDICINE GASTROINTESTINAL BLEEDING SCAN TECHNIQUE: Sequential abdominal images were obtained following intravenous administration of Tc-12m labeled red  blood cells. RADIOPHARMACEUTICALS:  22.8 mCi Tc-38m pertechnetate in-vitro labeled red cells. COMPARISON:  None. FINDINGS: During the first hour of imaging there is a focal area of increased radiotracer uptake localizing to the right colon. Over time, this area increases in intensity. On the second hour there is antegrade progression of the radiopharmaceutical conforming to the expected course of the ascending colon,  transverse colon and descending colon. Radiotracer activity extends to the distal descending colon. IMPRESSION: 1. Examination is positive for active GI bleed. The source of the bleed is felt to be proximal right colon. 2. Critical Value/emergent results were called by telephone at the time of interpretation on 01/25/2020 at 5:11 pm to provider [facility] , who verbally acknowledged these results. Electronically Signed   By: Kerby Moors M.D.   On: 01/25/2020 17:11   IR Angiogram Visceral Selective  Result Date: 01/26/2020 INDICATION: 64 year old female status post treatment of intracranial aneurysms with flow diverting (pipeline) stents at the beginning of the month. She has been on Brilinta and aspirin since that time and cannot, the dual antiplatelet therapy for fear of thrombosis of the recently placed flow diverting stents which could result in a catastrophic stroke. She was recently diagnosed with ascending diverticulitis, was treated and discharged on 01/18/2020. She now presents with lower GI bleeding and acute on chronic anemia requiring transfusion. Nuclear medicine tagged red blood cell bleeding study localizes the bleeding to the ascending colon. She now presents for angiography and possible embolization. EXAM: IR ULTRASOUND GUIDANCE VASC ACCESS RIGHT; SELECTIVE VISCERAL ARTERIOGRAPHY; ADDITIONAL ARTERIOGRAPHY 1. Ultrasound-guided vascular access left common femoral artery 2. Catheterization of the celiac artery with arteriogram 3. Catheterization the superior mesenteric artery with arteriogram 4. Catheterization of the middle colic artery with arteriogram 5. Catheterization of the right colic artery with arteriogram 6. Catheterization of ileal branch with arteriogram MEDICATIONS: None ANESTHESIA/SEDATION: Moderate (conscious) sedation was employed during this procedure. A total of Versed 5 mg and Fentanyl 150 mcg was administered intravenously. Moderate Sedation Time: 49 minutes. The patient's  level of consciousness and vital signs were monitored continuously by radiology nursing throughout the procedure under my direct supervision. CONTRAST:  94mL OMNIPAQUE IOHEXOL 300 MG/ML SOLN, 63mL OMNIPAQUE IOHEXOL 300 MG/ML SOLN, 29mL OMNIPAQUE IOHEXOL 300 MG/ML SOLN FLUOROSCOPY TIME:  Fluoroscopy Time: 6 minutes 48 seconds (2151 mGy). COMPLICATIONS: None immediate. PROCEDURE: Informed consent was obtained from the patient following explanation of the procedure, risks, benefits and alternatives. The patient understands, agrees and consents for the procedure. All questions were addressed. A time out was performed prior to the initiation of the procedure. Maximal barrier sterile technique utilized including caps, mask, sterile gowns, sterile gloves, large sterile drape, hand hygiene, and Betadine prep. The right groin was interrogated with ultrasound. The common femoral artery is patent. There is a metallic clip anterior to the common femoral artery as well as extensive thickening of the soft tissues over the common femoral artery. An attempt was made to puncture the artery, however the inflammatory changes are extremely scirrhous and it was difficult to pass the needle. Therefore, the decision was made to proceed with a left common femoral access. The left common femoral artery was interrogated with ultrasound and found to be widely patent. An image was obtained and stored for the medical record. Local anesthesia was attained by infiltration with 1% lidocaine. A small dermatotomy was made. Under real-time sonographic guidance, the vessel was punctured with a 21 gauge micropuncture needle. Using standard technique, the initial micro needle was exchanged over a  0.018 micro wire for a transitional 4 [place] micro sheath. The micro sheath was then exchanged over a 0.035 wire for a 5 French vascular sheath. A C2 cobra catheter was advanced over a Bentson wire into the abdominal aorta. The catheter was used to select the  celiac axis. A celiac arteriogram was performed. Conventional celiac artery anatomy. No evidence of replaced middle colic artery. No evidence of active bleeding. The C2 cobra catheter was then used to select the superior mesenteric artery. Arteriography was performed. There appears to be a focus of blush or hyperemia in the right hemiabdomen in the region of the ascending colon. The decision was made to pursue more selective catheterization. Therefore, a renegade STC microcatheter was advanced over a Fathom 16 wire. An initial branch was selected and arteriography performed. This is an ileal branch artery. No evidence of active bleeding. The microcatheter was brought back into the main SMA. The next branch selected was the middle colic artery. Arteriography was performed with excellent opacification of the hepatic flexure of the. No evidence of vascular abnormality or active bleeding. The next artery selected was the right colic artery. Arteriography was performed. There is a several cm segment of hyperemia in the mid ascending colon. However, there is no evidence of active bleeding or arteriovenous malformation. Microcatheter was advanced more distally in the right colic artery an additional magnified arteriography was performed confirming regional hyperemia without evidence of active hemorrhage. At this point, mild Gel-Foam embolization of the region was considered. However, this is a fairly large region of the colon and such embolization could result in ischemic colitis or delay healing of the underlying colitis. Given that there is no active bleeding at this time and that the patient is hemodynamically stable, no embolization was performed. The catheters were removed. Hemostasis was attained with the assistance of a 6 French Angio-Seal device. The patient tolerated the procedure well. IMPRESSION: 1. No active bleeding at the time of arteriography. 2. There is a several cm segment of regional hyperemia in the mid  ascending colon which corresponds with the site of active colitis on the prior CT scan. This is almost certainly the source of the patient's bleeding and either represents recurrent active colitis, or potentially an underlying colonic neoplasm. PLAN: 1. Embolization was not performed at this time as embolization of such a large region of hyperemia could result in ischemic colitis and further complication. Regional embolization could be considered if the patient has a life-threatening bleeding event or becomes otherwise unstable. 2. Recommend treatment for active colitis to be followed by colonoscopy to exclude an underlying neoplastic process in the region. 3. Continue to trend H and H and transfuse as needed. Bleeding should cease once the region of active colitis resolves. Signed, Criselda Peaches, MD, [facility] Radiology Electronically Signed   By: Jacqulynn Cadet M.D.   On: 01/26/2020 08:41   IR Angiogram Visceral Selective  Result Date: 01/26/2020 INDICATION: 64 year old female status post treatment of intracranial aneurysms with flow diverting (pipeline) stents at the beginning of the month. She has been on Brilinta and aspirin since that time and cannot, the dual antiplatelet therapy for fear of thrombosis of the recently placed flow diverting stents which could result in a catastrophic stroke. She was recently diagnosed with ascending diverticulitis, was treated and discharged on 01/18/2020. She now presents with lower GI bleeding and acute on chronic anemia requiring transfusion. Nuclear medicine tagged red blood cell bleeding study localizes the bleeding to  the ascending colon. She now presents for angiography and possible embolization. EXAM: IR ULTRASOUND GUIDANCE VASC ACCESS RIGHT; SELECTIVE VISCERAL ARTERIOGRAPHY; ADDITIONAL ARTERIOGRAPHY 1. Ultrasound-guided vascular access left common femoral artery 2. Catheterization of the celiac  artery with arteriogram 3. Catheterization the superior mesenteric artery with arteriogram 4. Catheterization of the middle colic artery with arteriogram 5. Catheterization of the right colic artery with arteriogram 6. Catheterization of ileal branch with arteriogram MEDICATIONS: None ANESTHESIA/SEDATION: Moderate (conscious) sedation was employed during this procedure. A total of Versed 5 mg and Fentanyl 150 mcg was administered intravenously. Moderate Sedation Time: 49 minutes. The patient's level of consciousness and vital signs were monitored continuously by radiology nursing throughout the procedure under my direct supervision. CONTRAST:  43mL OMNIPAQUE IOHEXOL 300 MG/ML SOLN, 6mL OMNIPAQUE IOHEXOL 300 MG/ML SOLN, 8mL OMNIPAQUE IOHEXOL 300 MG/ML SOLN FLUOROSCOPY TIME:  Fluoroscopy Time: 6 minutes 48 seconds (2151 mGy). COMPLICATIONS: None immediate. PROCEDURE: Informed consent was obtained from the patient following explanation of the procedure, risks, benefits and alternatives. The patient understands, agrees and consents for the procedure. All questions were addressed. A time out was performed prior to the initiation of the procedure. Maximal barrier sterile technique utilized including caps, mask, sterile gowns, sterile gloves, large sterile drape, hand hygiene, and Betadine prep. The right groin was interrogated with ultrasound. The common femoral artery is patent. There is a metallic clip anterior to the common femoral artery as well as extensive thickening of the soft tissues over the common femoral artery. An attempt was made to puncture the artery, however the inflammatory changes are extremely scirrhous and it was difficult to pass the needle. Therefore, the decision was made to proceed with a left common femoral access. The left common femoral artery was interrogated with ultrasound and found to be widely patent. An image was obtained and stored for the medical record. Local anesthesia was attained  by infiltration with 1% lidocaine. A small dermatotomy was made. Under real-time sonographic guidance, the vessel was punctured with a 21 gauge micropuncture needle. Using standard technique, the initial micro needle was exchanged over a 0.018 micro wire for a transitional 4 [place] micro sheath. The micro sheath was then exchanged over a 0.035 wire for a 5 French vascular sheath. A C2 cobra catheter was advanced over a Bentson wire into the abdominal aorta. The catheter was used to select the celiac axis. A celiac arteriogram was performed. Conventional celiac artery anatomy. No evidence of replaced middle colic artery. No evidence of active bleeding. The C2 cobra catheter was then used to select the superior mesenteric artery. Arteriography was performed. There appears to be a focus of blush or hyperemia in the right hemiabdomen in the region of the ascending colon. The decision was made to pursue more selective catheterization. Therefore, a renegade STC microcatheter was advanced over a Fathom 16 wire. An initial branch was selected and arteriography performed. This is an ileal branch artery. No evidence of active bleeding. The microcatheter was brought back into the main SMA. The next branch selected was the middle colic artery. Arteriography was performed with excellent opacification of the hepatic flexure of the. No evidence of vascular abnormality or active bleeding. The next artery selected was the right colic artery. Arteriography was performed. There is a several cm segment of hyperemia in the mid ascending colon. However, there is no evidence of active bleeding or arteriovenous malformation. Microcatheter was advanced more distally in the right colic artery an additional magnified arteriography was performed confirming regional hyperemia without evidence  of active hemorrhage. At this point, mild Gel-Foam embolization of the region was considered. However, this is a fairly large region of the colon and such  embolization could result in ischemic colitis or delay healing of the underlying colitis. Given that there is no active bleeding at this time and that the patient is hemodynamically stable, no embolization was performed. The catheters were removed. Hemostasis was attained with the assistance of a 6 French Angio-Seal device. The patient tolerated the procedure well. IMPRESSION: 1. No active bleeding at the time of arteriography. 2. There is a several cm segment of regional hyperemia in the mid ascending colon which corresponds with the site of active colitis on the prior CT scan. This is almost certainly the source of the patient's bleeding and either represents recurrent active colitis, or potentially an underlying colonic neoplasm. PLAN: 1. Embolization was not performed at this time as embolization of such a large region of hyperemia could result in ischemic colitis and further complication. Regional embolization could be considered if the patient has a life-threatening bleeding event or becomes otherwise unstable. 2. Recommend treatment for active colitis to be followed by colonoscopy to exclude an underlying neoplastic process in the region. 3. Continue to trend H and H and transfuse as needed. Bleeding should cease once the region of active colitis resolves. Signed, Criselda Peaches, MD, [facility] Radiology Electronically Signed   By: Jacqulynn Cadet M.D.   On: 01/26/2020 08:41   IR Angiogram Selective Each Additional Vessel  Result Date: 01/26/2020 INDICATION: 64 year old female status post treatment of intracranial aneurysms with flow diverting (pipeline) stents at the beginning of the month. She has been on Brilinta and aspirin since that time and cannot, the dual antiplatelet therapy for fear of thrombosis of the recently placed flow diverting stents which could result in a catastrophic stroke. She was recently diagnosed with ascending  diverticulitis, was treated and discharged on 01/18/2020. She now presents with lower GI bleeding and acute on chronic anemia requiring transfusion. Nuclear medicine tagged red blood cell bleeding study localizes the bleeding to the ascending colon. She now presents for angiography and possible embolization. EXAM: IR ULTRASOUND GUIDANCE VASC ACCESS RIGHT; SELECTIVE VISCERAL ARTERIOGRAPHY; ADDITIONAL ARTERIOGRAPHY 1. Ultrasound-guided vascular access left common femoral artery 2. Catheterization of the celiac artery with arteriogram 3. Catheterization the superior mesenteric artery with arteriogram 4. Catheterization of the middle colic artery with arteriogram 5. Catheterization of the right colic artery with arteriogram 6. Catheterization of ileal branch with arteriogram MEDICATIONS: None ANESTHESIA/SEDATION: Moderate (conscious) sedation was employed during this procedure. A total of Versed 5 mg and Fentanyl 150 mcg was administered intravenously. Moderate Sedation Time: 49 minutes. The patient's level of consciousness and vital signs were monitored continuously by radiology nursing throughout the procedure under my direct supervision. CONTRAST:  10mL OMNIPAQUE IOHEXOL 300 MG/ML SOLN, 23mL OMNIPAQUE IOHEXOL 300 MG/ML SOLN, 49mL OMNIPAQUE IOHEXOL 300 MG/ML SOLN FLUOROSCOPY TIME:  Fluoroscopy Time: 6 minutes 48 seconds (2151 mGy). COMPLICATIONS: None immediate. PROCEDURE: Informed consent was obtained from the patient following explanation of the procedure, risks, benefits and alternatives. The patient understands, agrees and consents for the procedure. All questions were addressed. A time out was performed prior to the initiation of the procedure. Maximal barrier sterile technique utilized including caps, mask, sterile gowns, sterile gloves, large sterile drape, hand hygiene, and Betadine prep. The right groin was interrogated with ultrasound. The common femoral artery is patent. There is a metallic clip anterior to  the  common femoral artery as well as extensive thickening of the soft tissues over the common femoral artery. An attempt was made to puncture the artery, however the inflammatory changes are extremely scirrhous and it was difficult to pass the needle. Therefore, the decision was made to proceed with a left common femoral access. The left common femoral artery was interrogated with ultrasound and found to be widely patent. An image was obtained and stored for the medical record. Local anesthesia was attained by infiltration with 1% lidocaine. A small dermatotomy was made. Under real-time sonographic guidance, the vessel was punctured with a 21 gauge micropuncture needle. Using standard technique, the initial micro needle was exchanged over a 0.018 micro wire for a transitional 4 [place] micro sheath. The micro sheath was then exchanged over a 0.035 wire for a 5 French vascular sheath. A C2 cobra catheter was advanced over a Bentson wire into the abdominal aorta. The catheter was used to select the celiac axis. A celiac arteriogram was performed. Conventional celiac artery anatomy. No evidence of replaced middle colic artery. No evidence of active bleeding. The C2 cobra catheter was then used to select the superior mesenteric artery. Arteriography was performed. There appears to be a focus of blush or hyperemia in the right hemiabdomen in the region of the ascending colon. The decision was made to pursue more selective catheterization. Therefore, a renegade STC microcatheter was advanced over a Fathom 16 wire. An initial branch was selected and arteriography performed. This is an ileal branch artery. No evidence of active bleeding. The microcatheter was brought back into the main SMA. The next branch selected was the middle colic artery. Arteriography was performed with excellent opacification of the hepatic flexure of the. No evidence of vascular abnormality or active bleeding. The next artery selected was the right  colic artery. Arteriography was performed. There is a several cm segment of hyperemia in the mid ascending colon. However, there is no evidence of active bleeding or arteriovenous malformation. Microcatheter was advanced more distally in the right colic artery an additional magnified arteriography was performed confirming regional hyperemia without evidence of active hemorrhage. At this point, mild Gel-Foam embolization of the region was considered. However, this is a fairly large region of the colon and such embolization could result in ischemic colitis or delay healing of the underlying colitis. Given that there is no active bleeding at this time and that the patient is hemodynamically stable, no embolization was performed. The catheters were removed. Hemostasis was attained with the assistance of a 6 French Angio-Seal device. The patient tolerated the procedure well. IMPRESSION: 1. No active bleeding at the time of arteriography. 2. There is a several cm segment of regional hyperemia in the mid ascending colon which corresponds with the site of active colitis on the prior CT scan. This is almost certainly the source of the patient's bleeding and either represents recurrent active colitis, or potentially an underlying colonic neoplasm. PLAN: 1. Embolization was not performed at this time as embolization of such a large region of hyperemia could result in ischemic colitis and further complication. Regional embolization could be considered if the patient has a life-threatening bleeding event or becomes otherwise unstable. 2. Recommend treatment for active colitis to be followed by colonoscopy to exclude an underlying neoplastic process in the region. 3. Continue to trend H and H and transfuse as needed. Bleeding should cease once the region of active colitis resolves. Signed, Criselda Peaches, MD, [place] [facility] Radiology Electronically  Signed   By: Jacqulynn Cadet M.D.   On: 01/26/2020 08:41   IR Angiogram Selective Each Additional Vessel  Result Date: 01/26/2020 INDICATION: 64 year old female status post treatment of intracranial aneurysms with flow diverting (pipeline) stents at the beginning of the month. She has been on Brilinta and aspirin since that time and cannot, the dual antiplatelet therapy for fear of thrombosis of the recently placed flow diverting stents which could result in a catastrophic stroke. She was recently diagnosed with ascending diverticulitis, was treated and discharged on 01/18/2020. She now presents with lower GI bleeding and acute on chronic anemia requiring transfusion. Nuclear medicine tagged red blood cell bleeding study localizes the bleeding to the ascending colon. She now presents for angiography and possible embolization. EXAM: IR ULTRASOUND GUIDANCE VASC ACCESS RIGHT; SELECTIVE VISCERAL ARTERIOGRAPHY; ADDITIONAL ARTERIOGRAPHY 1. Ultrasound-guided vascular access left common femoral artery 2. Catheterization of the celiac artery with arteriogram 3. Catheterization the superior mesenteric artery with arteriogram 4. Catheterization of the middle colic artery with arteriogram 5. Catheterization of the right colic artery with arteriogram 6. Catheterization of ileal branch with arteriogram MEDICATIONS: None ANESTHESIA/SEDATION: Moderate (conscious) sedation was employed during this procedure. A total of Versed 5 mg and Fentanyl 150 mcg was administered intravenously. Moderate Sedation Time: 49 minutes. The patient's level of consciousness and vital signs were monitored continuously by radiology nursing throughout the procedure under my direct supervision. CONTRAST:  34mL OMNIPAQUE IOHEXOL 300 MG/ML SOLN, 24mL OMNIPAQUE IOHEXOL 300 MG/ML SOLN, 78mL OMNIPAQUE IOHEXOL 300 MG/ML SOLN FLUOROSCOPY TIME:  Fluoroscopy Time: 6 minutes 48 seconds (2151 mGy). COMPLICATIONS: None immediate. PROCEDURE: Informed consent was obtained from the  patient following explanation of the procedure, risks, benefits and alternatives. The patient understands, agrees and consents for the procedure. All questions were addressed. A time out was performed prior to the initiation of the procedure. Maximal barrier sterile technique utilized including caps, mask, sterile gowns, sterile gloves, large sterile drape, hand hygiene, and Betadine prep. The right groin was interrogated with ultrasound. The common femoral artery is patent. There is a metallic clip anterior to the common femoral artery as well as extensive thickening of the soft tissues over the common femoral artery. An attempt was made to puncture the artery, however the inflammatory changes are extremely scirrhous and it was difficult to pass the needle. Therefore, the decision was made to proceed with a left common femoral access. The left common femoral artery was interrogated with ultrasound and found to be widely patent. An image was obtained and stored for the medical record. Local anesthesia was attained by infiltration with 1% lidocaine. A small dermatotomy was made. Under real-time sonographic guidance, the vessel was punctured with a 21 gauge micropuncture needle. Using standard technique, the initial micro needle was exchanged over a 0.018 micro wire for a transitional 4 [place] micro sheath. The micro sheath was then exchanged over a 0.035 wire for a 5 French vascular sheath. A C2 cobra catheter was advanced over a Bentson wire into the abdominal aorta. The catheter was used to select the celiac axis. A celiac arteriogram was performed. Conventional celiac artery anatomy. No evidence of replaced middle colic artery. No evidence of active bleeding. The C2 cobra catheter was then used to select the superior mesenteric artery. Arteriography was performed. There appears to be a focus of blush or hyperemia in the right hemiabdomen in the region of the ascending colon. The decision was made to pursue more  selective catheterization. Therefore, a renegade STC microcatheter was advanced  over a Fathom 16 wire. An initial branch was selected and arteriography performed. This is an ileal branch artery. No evidence of active bleeding. The microcatheter was brought back into the main SMA. The next branch selected was the middle colic artery. Arteriography was performed with excellent opacification of the hepatic flexure of the. No evidence of vascular abnormality or active bleeding. The next artery selected was the right colic artery. Arteriography was performed. There is a several cm segment of hyperemia in the mid ascending colon. However, there is no evidence of active bleeding or arteriovenous malformation. Microcatheter was advanced more distally in the right colic artery an additional magnified arteriography was performed confirming regional hyperemia without evidence of active hemorrhage. At this point, mild Gel-Foam embolization of the region was considered. However, this is a fairly large region of the colon and such embolization could result in ischemic colitis or delay healing of the underlying colitis. Given that there is no active bleeding at this time and that the patient is hemodynamically stable, no embolization was performed. The catheters were removed. Hemostasis was attained with the assistance of a 6 French Angio-Seal device. The patient tolerated the procedure well. IMPRESSION: 1. No active bleeding at the time of arteriography. 2. There is a several cm segment of regional hyperemia in the mid ascending colon which corresponds with the site of active colitis on the prior CT scan. This is almost certainly the source of the patient's bleeding and either represents recurrent active colitis, or potentially an underlying colonic neoplasm. PLAN: 1. Embolization was not performed at this time as embolization of such a large region of hyperemia could result in ischemic colitis and further complication. Regional  embolization could be considered if the patient has a life-threatening bleeding event or becomes otherwise unstable. 2. Recommend treatment for active colitis to be followed by colonoscopy to exclude an underlying neoplastic process in the region. 3. Continue to trend H and H and transfuse as needed. Bleeding should cease once the region of active colitis resolves. Signed, Criselda Peaches, MD, [place] [facility] Radiology Electronically Signed   By: Jacqulynn Cadet M.D.   On: 01/26/2020 08:41   IR US Guide Vasc Access Right  Result Date: 01/26/2020 INDICATION: 64 year old female status post treatment of intracranial aneurysms with flow diverting (pipeline) stents at the beginning of the month. She has been on Brilinta and aspirin since that time and cannot, the dual antiplatelet therapy for fear of thrombosis of the recently placed flow diverting stents which could result in a catastrophic stroke. She was recently diagnosed with ascending diverticulitis, was treated and discharged on 01/18/2020. She now presents with lower GI bleeding and acute on chronic anemia requiring transfusion. Nuclear medicine tagged red blood cell bleeding study localizes the bleeding to the ascending colon. She now presents for angiography and possible embolization. EXAM: IR ULTRASOUND GUIDANCE VASC ACCESS RIGHT; SELECTIVE VISCERAL ARTERIOGRAPHY; ADDITIONAL ARTERIOGRAPHY 1. Ultrasound-guided vascular access left common femoral artery 2. Catheterization of the celiac artery with arteriogram 3. Catheterization the superior mesenteric artery with arteriogram 4. Catheterization of the middle colic artery with arteriogram 5. Catheterization of the right colic artery with arteriogram 6. Catheterization of ileal branch with arteriogram MEDICATIONS: None ANESTHESIA/SEDATION: Moderate (conscious) sedation was employed during this procedure. A total of Versed 5 mg and Fentanyl 150 mcg was  administered intravenously. Moderate Sedation Time: 49 minutes. The patient's level of consciousness and vital signs were monitored continuously by radiology nursing throughout the procedure under my direct  supervision. CONTRAST:  57mL OMNIPAQUE IOHEXOL 300 MG/ML SOLN, 42mL OMNIPAQUE IOHEXOL 300 MG/ML SOLN, 41mL OMNIPAQUE IOHEXOL 300 MG/ML SOLN FLUOROSCOPY TIME:  Fluoroscopy Time: 6 minutes 48 seconds (2151 mGy). COMPLICATIONS: None immediate. PROCEDURE: Informed consent was obtained from the patient following explanation of the procedure, risks, benefits and alternatives. The patient understands, agrees and consents for the procedure. All questions were addressed. A time out was performed prior to the initiation of the procedure. Maximal barrier sterile technique utilized including caps, mask, sterile gowns, sterile gloves, large sterile drape, hand hygiene, and Betadine prep. The right groin was interrogated with ultrasound. The common femoral artery is patent. There is a metallic clip anterior to the common femoral artery as well as extensive thickening of the soft tissues over the common femoral artery. An attempt was made to puncture the artery, however the inflammatory changes are extremely scirrhous and it was difficult to pass the needle. Therefore, the decision was made to proceed with a left common femoral access. The left common femoral artery was interrogated with ultrasound and found to be widely patent. An image was obtained and stored for the medical record. Local anesthesia was attained by infiltration with 1% lidocaine. A small dermatotomy was made. Under real-time sonographic guidance, the vessel was punctured with a 21 gauge micropuncture needle. Using standard technique, the initial micro needle was exchanged over a 0.018 micro wire for a transitional 4 [place] micro sheath. The micro sheath was then exchanged over a 0.035 wire for a 5 French vascular sheath. A C2 cobra catheter was advanced over  a Bentson wire into the abdominal aorta. The catheter was used to select the celiac axis. A celiac arteriogram was performed. Conventional celiac artery anatomy. No evidence of replaced middle colic artery. No evidence of active bleeding. The C2 cobra catheter was then used to select the superior mesenteric artery. Arteriography was performed. There appears to be a focus of blush or hyperemia in the right hemiabdomen in the region of the ascending colon. The decision was made to pursue more selective catheterization. Therefore, a renegade STC microcatheter was advanced over a Fathom 16 wire. An initial branch was selected and arteriography performed. This is an ileal branch artery. No evidence of active bleeding. The microcatheter was brought back into the main SMA. The next branch selected was the middle colic artery. Arteriography was performed with excellent opacification of the hepatic flexure of the. No evidence of vascular abnormality or active bleeding. The next artery selected was the right colic artery. Arteriography was performed. There is a several cm segment of hyperemia in the mid ascending colon. However, there is no evidence of active bleeding or arteriovenous malformation. Microcatheter was advanced more distally in the right colic artery an additional magnified arteriography was performed confirming regional hyperemia without evidence of active hemorrhage. At this point, mild Gel-Foam embolization of the region was considered. However, this is a fairly large region of the colon and such embolization could result in ischemic colitis or delay healing of the underlying colitis. Given that there is no active bleeding at this time and that the patient is hemodynamically stable, no embolization was performed. The catheters were removed. Hemostasis was attained with the assistance of a 6 French Angio-Seal device. The patient tolerated the procedure well. IMPRESSION: 1. No active bleeding at the time of  arteriography. 2. There is a several cm segment of regional hyperemia in the mid ascending colon which corresponds with the site of active colitis on the prior CT scan. This  is almost certainly the source of the patient's bleeding and either represents recurrent active colitis, or potentially an underlying colonic neoplasm. PLAN: 1. Embolization was not performed at this time as embolization of such a large region of hyperemia could result in ischemic colitis and further complication. Regional embolization could be considered if the patient has a life-threatening bleeding event or becomes otherwise unstable. 2. Recommend treatment for active colitis to be followed by colonoscopy to exclude an underlying neoplastic process in the region. 3. Continue to trend H and H and transfuse as needed. Bleeding should cease once the region of active colitis resolves. Signed, Criselda Peaches, MD, [place] [facility] Radiology Electronically Signed   By: Jacqulynn Cadet M.D.   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 - will need to discuss with IR/physician at [facility] if DAPT can safely be paused for an operation.  Jill Alexanders, [facility] Surgery 01/27/2020, 10:57 AM Pager: [phone] Consults: [phone]

## 2020-01-27 NOTE — TOC Initial Note (Signed)
Transition of Care [facility]) - Initial/Assessment Note    Patient Details  Name: Nancy Tran MRN: [id number] Date of Birth: [birth year]  [facility] CM/SW Contact:    Purcell Mouton, RN Phone Number: 01/27/2020, 3:59 PM  Clinical Narrative:                 TOC will continue to follow pt for discharge needs.   Expected Discharge Plan: Home/Self Care Barriers to Discharge: No Barriers Identified   Patient Goals and CMS Choice Patient states their goals for this hospitalization and ongoing recovery are:: To get better      Expected Discharge Plan and Services Expected Discharge Plan: Home/Self Care       Living arrangements for the past 2 months: Single Family Home                                      Prior Living Arrangements/Services Living arrangements for the past 2 months: Single Family Home Lives with:: Spouse Patient language and need for interpreter reviewed:: No Do you feel safe going back to the place where you live?: Yes               Activities of Daily Living Home Assistive Devices/Equipment: None ADL Screening (condition at time of admission) Patient's cognitive ability adequate to safely complete daily activities?: Yes Is the patient deaf or have difficulty hearing?: No Does the patient have difficulty seeing, even when wearing glasses/contacts?: No Does the patient have difficulty concentrating, remembering, or making decisions?: No Patient able to express need for assistance with ADLs?: Yes Does the patient have difficulty dressing or bathing?: No Independently performs ADLs?: Yes (appropriate for developmental age) Does the patient have difficulty walking or climbing stairs?: No Weakness of Legs: None Weakness of Arms/Hands: None  Permission Sought/Granted                  Emotional Assessment Appearance:: Appears stated age     Orientation: : Oriented to Self, Oriented to Place, Oriented to  Time, Oriented  to Situation      Admission diagnosis:  Lower GI bleed [K92.2] Gastrointestinal hemorrhage, unspecified gastrointestinal hemorrhage type [K92.2] Patient Active Problem List   Diagnosis Date Noted  . Acute lower GI bleeding 01/24/2020  . Syncope 01/24/2020  . GERD (gastroesophageal reflux disease) 01/24/2020  . Diverticulosis 01/24/2020  . Hypokalemia 01/10/2020  . Occult GI bleeding 01/10/2020  . AKI (acute kidney injury) ([place]) 01/10/2020  . Hypomagnesemia 01/10/2020  . Acute diverticulitis 01/09/2020  . Cerebral aneurysm 12/09/2019  . Family history of brain aneurysm 11/07/2019  . GAD (generalized anxiety disorder) 10/28/2019  . Tension-type headache, not intractable 10/28/2019  . Mild renal insufficiency 10/14/2019  . Leg swelling 07/20/2019  . Normochromic anemia 04/12/2019  . Gastritis determined by endoscopy 11/02/2018  . Cervical cancer screening 10/12/2018  . Neutropenia ([place]) 07/28/2018  . History of colonic polyps 06/24/2018  . Odynophagia 06/10/2018  . Thyroid nodule 06/10/2018  . Prediabetes 05/26/2015  . Osteoarthritis of knee 11/07/2014  . Essential hypertension 09/03/2014  . Hyperlipidemia LDL goal <130 10/26/2013  . Hidradenitis 06/10/2012  . Tubal ligation status    PCP:  Lilian Coma., MD Pharmacy:   [facility], [address] [place] [address] Phone: [phone] Fax: [phone]  [place], [address] [place] [address] Phone: [phone] Fax: [phone]  [facility] [address] - [place], [place] - [address], [address] [place] [address] Phone: [phone] Fax: [phone]     Social Determinants of Health (SDOH) Interventions    Readmission Risk Interventions No flowsheet data found.

## 2020-01-27 NOTE — Anesthesia Preprocedure Evaluation (Signed)
Anesthesia Evaluation  Patient identified by MRN, date of birth, ID band Patient awake    Reviewed: Allergy & Precautions, H&P , NPO status , Patient's Chart, lab work & pertinent test results  History of Anesthesia Complications (+) PONV and history of anesthetic complications  Airway Mallampati: II   Neck ROM: full    Dental   Pulmonary former smoker,    breath sounds clear to auscultation       Cardiovascular hypertension,  Rhythm:regular Rate:Normal     Neuro/Psych  Headaches, PSYCHIATRIC DISORDERS Anxiety Cerebral aneurysm s/p stent. Takes brilinta and ASA    GI/Hepatic PUD, GERD  ,GI bleed   Endo/Other    Renal/GU      Musculoskeletal  (+) Arthritis ,   Abdominal   Peds  Hematology  (+) Blood dyscrasia, anemia ,   Anesthesia Other Findings   Reproductive/Obstetrics                             Anesthesia Physical Anesthesia Plan  ASA: III  Anesthesia Plan: MAC   Post-op Pain Management:    Induction: Intravenous  PONV Risk Score and Plan: 3 and Propofol infusion and Treatment may vary due to age or medical condition  Airway Management Planned: Simple Face Mask  Additional Equipment:   Intra-op Plan:   Post-operative Plan:   Informed Consent: I have reviewed the patients History and Physical, chart, labs and discussed the procedure including the risks, benefits and alternatives for the proposed anesthesia with the patient or authorized representative who has indicated his/her understanding and acceptance.       Plan Discussed with: Anesthesiologist, CRNA and Surgeon  Anesthesia Plan Comments:         Anesthesia Quick Evaluation

## 2020-01-27 NOTE — Plan of Care (Signed)

## 2020-01-28 DIAGNOSIS — K5733 Diverticulitis of large intestine without perforation or abscess with bleeding: Secondary | ICD-10-CM

## 2020-01-28 LAB — BASIC METABOLIC PANEL
Anion gap: 9 (ref 5–15)
BUN: 7 mg/dL — ABNORMAL LOW (ref 8–23)
CO2: 24 mmol/L (ref 22–32)
Calcium: 9.4 mg/dL (ref 8.9–10.3)
Chloride: 109 mmol/L (ref 98–111)
Creatinine, Ser: 0.89 mg/dL (ref 0.44–1.00)
GFR calc Af Amer: >60 mL/min (ref >60)
GFR calc non Af Amer: >60 mL/min (ref >60)
Glucose, Bld: 96 mg/dL (ref 70–99)
Potassium: 4.1 mmol/L (ref 3.5–5.1)
Sodium: 142 mmol/L (ref 135–145)

## 2020-01-28 LAB — CBC WITH DIFFERENTIAL/PLATELET
Abs Immature Granulocytes: 0.01 10*3/uL (ref 0.00–0.07)
Basophils Absolute: 0 10*3/uL (ref 0.0–0.1)
Basophils Relative: 1 %
Eosinophils Absolute: 0.1 10*3/uL (ref 0.0–0.5)
Eosinophils Relative: 2 %
HCT: 25.1 % — ABNORMAL LOW (ref 36.0–46.0)
Hemoglobin: 7.9 g/dL — ABNORMAL LOW (ref 12.0–15.0)
Immature Granulocytes: 0 %
Lymphocytes Relative: 26 %
Lymphs Abs: 1 10*3/uL (ref 0.7–4.0)
MCH: 27.3 pg (ref 26.0–34.0)
MCHC: 31.5 g/dL (ref 30.0–36.0)
MCV: 86.9 fL (ref 80.0–100.0)
Monocytes Absolute: 0.5 10*3/uL (ref 0.1–1.0)
Monocytes Relative: 12 %
Neutro Abs: 2.2 10*3/uL (ref 1.7–7.7)
Neutrophils Relative %: 59 %
Platelets: 515 10*3/uL — ABNORMAL HIGH (ref 150–400)
RBC: 2.89 MIL/uL — ABNORMAL LOW (ref 3.87–5.11)
RDW: 15.2 % (ref 11.5–15.5)
WBC: 3.9 10*3/uL — ABNORMAL LOW (ref 4.0–10.5)
nRBC: 0 % (ref 0.0–0.2)

## 2020-01-28 LAB — SURGICAL PATHOLOGY

## 2020-01-28 MED ORDER — METRONIDAZOLE 500 MG PO TABS
500.0000 mg | ORAL_TABLET | Freq: Three times a day (TID) | ORAL | Status: DC
  Administered 2020-01-28 – 2020-01-29 (×3): 500 mg via ORAL
  Filled 2020-01-28 (×4): qty 1

## 2020-01-28 MED ORDER — SODIUM CHLORIDE 0.9 % IV SOLN
510.0000 mg | Freq: Once | INTRAVENOUS | Status: AC
  Administered 2020-01-28: 16:00:00 510 mg via INTRAVENOUS
  Filled 2020-01-28: qty 510

## 2020-01-28 MED ORDER — CIPROFLOXACIN IN D5W 200 MG/100ML IV SOLN
200.0000 mg | Freq: Two times a day (BID) | INTRAVENOUS | Status: DC
  Administered 2020-01-28 (×2): 200 mg via INTRAVENOUS
  Filled 2020-01-28 (×3): qty 100

## 2020-01-28 NOTE — Progress Notes (Addendum)
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  64 year old female who presented with hematochezia and syncope.  Recent hospitalization for diverticulitis lower GI bleed March 14 of March 23.  She also has hypertension, dyslipidemia and status post left paraclinoid ICA stent placement (12/09/2019, on aspirin and ticagrelor).  Hematochezia, dark red blood in her stools x1 she developed a syncope episode preceded by lightheadedness and weakness.  When EMS arrived her blood pressure was low.  In the emergency department after intravenous fluids her blood pressure was 125/66, heart rate 89, respiratory rate 14, temperature 98.2, oxygen saturation 100%, her lungs are clear to auscultation bilaterally, heart is also present rhythmic, soft abdomen and no lower extremity edema Sodium 132, potassium 4.0, chloride 99, bicarb 23, glucose 105, BUN 13, creatinine 1.13, white count 4.3, hemoglobin 8.0, lateral 25.2, platelets 525.  Head CT negative for acute changes.  EKG 78 bpm, normal axis, normal intervals, sinus rhythm, positive PACs, J-point elevation precordial leads, no ST segment or T wave changes.  Patient received 1 unit packed red blood cells and underwent tagged RBC study which was positive.  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  Preliminary pathology negative for malignancy, will resume dual antiplatelet therapy and will start patient on antibiotic therapy. If good toleration, will discharge home with outpatient follow up.    Assessment & Plan:   Principal Problem:   Acute lower GI bleeding Active Problems:   Cerebral aneurysm   Essential hypertension  Syncope   GERD (gastroesophageal reflux disease)   Diverticulosis     1. Acute blood loss anemia due to lower GI bleed due to NEW colonic mass. Hgb at 7.9 and Hct at 25.1. No further significant bleeding. Pathology report with no malignant cells. Positive granulomatous tissue consistent with ulcer.   Discussed with GI, and will continue anticoagulation with asa and ticagrelor for now and monitor for signs of bleeding. Will resume antibiotic therapy with metronidazole and ciprofloxacin for 2 more weeks.   2. HTN. Holding antihypertensive medications for now, to prevent hypotension.  3. Cerebral aneurysm. On asa and ticagrelor. Patient will need 3 mo of antiplatelet therapy.  4. Dyslipidemia. Continue with statin therapy.   4. GERD. Continue antiacid therapy with good toleration.   5. Iron deficiency anemia. Transferrin saturation less than 20, patient will receive IV iron today.   DVT prophylaxis: scd   Code Status:  full Family Communication: I spoke with patient's husband at the bedside, we talked in detail about patient's condition, plan of care and prognosis and all questions were addressed.  Disposition Plan/ discharge barriers: patient form home, barrier for dc monitor signs of recurrent bleeding    Consultants:   GI   Surgery   Procedures:   Colonoscopy      Subjective: Patient is feeling better, no nausea or vomiting, her abdominal pain is improving but not yet back to baseline, no chest pain or dyspnea.   Objective: Vitals:   01/27/20 0841 01/27/20 0850 01/27/20 2054 01/28/20 0518  BP: (!) 99/46 (!) 118/58 128/68 122/65  Pulse: 97 93 75 76  Resp: (!) 26 20 20    Temp: 98 F (36.7 C)  98.1 F (36.7  C) 98.8 F (37.1 C)  TempSrc: Axillary  Oral Oral  SpO2: 100%  100%   Weight:      Height:        Intake/Output Summary (Last 24 hours) at 01/28/2020 1048 Last data filed at 01/27/2020 2036 Gross per 24 hour  Intake 360 ml  Output --  Net 360 ml    Filed Weights   01/23/20 2044 01/24/20 0255 01/27/20 0733  Weight: 84.4 kg 82.1 kg 84.4 kg    Examination:   General: Not in pain or dyspnea, deconditioned  Neurology: Awake and alert, non focal  E ENT: mild pallor, no icterus, oral mucosa moist Cardiovascular: No JVD. S1-S2 present, rhythmic, no gallops, rubs, or murmurs. No lower extremity edema. Pulmonary: positive breath sounds bilaterally, adequate air movement, no wheezing, rhonchi or rales. Gastrointestinal. Abdomen with no organomegaly, no rebound or guarding. Mild pain to palpation.  Skin. No rashes Musculoskeletal: no joint deformities     Data Reviewed: I have personally reviewed following labs and imaging studies  CBC: Recent Labs  Lab 01/23/20 2112 01/24/20 0459 01/25/20 0339 01/25/20 1128 01/26/20 0451 01/26/20 1058 01/26/20 1809 01/26/20 1809 01/26/20 2337 01/27/20 0543 01/27/20 1154 01/27/20 2009 01/28/20 0314  WBC 4.3  --  3.2*  --  3.7*  --   --   --   --   --   --   --  3.9*  NEUTROABS 2.6  --   --   --   --   --   --   --   --   --   --   --  2.2  HGB 8.0*   < > 6.8*   < > 7.5*   < > 8.1*   < > 8.7* 9.3* 8.7* 7.7* 7.9*  HCT 25.2*   < > 22.3*   < > 23.9*   < > 25.6*  --  27.8* 29.8* 26.6*  --  25.1*  MCV 84.0  --  85.1  --  85.4  --   --   --   --   --   --   --  86.9  PLT 525*  --  477*  --  434*  --   --   --   --   --   --   --  515*   < > = values in this interval not displayed.   Basic Metabolic Panel: Recent Labs  Lab 01/23/20 2112 01/25/20 0339 01/28/20 0314  NA 132* 141 142  K 4.0 4.2 4.1  CL 99 110 109  CO2 23 24 24   GLUCOSE 105* 98 96  BUN 13 10 7*  CREATININE 1.13* 0.86 0.89  CALCIUM 9.1 8.9 9.4   GFR: Estimated Creatinine Clearance: 71.3 mL/min (by C-G formula based on SCr of 0.89 mg/dL). Liver Function Tests: No results for input(s): AST, ALT, ALKPHOS, BILITOT, PROT, ALBUMIN in the last 168 hours. No results for input(s): LIPASE, AMYLASE in the last 168 hours. No  results for input(s): AMMONIA in the last 168 hours. Coagulation Profile: No results for input(s): INR, PROTIME in the last 168 hours. Cardiac Enzymes: No results for input(s): CKTOTAL, CKMB, CKMBINDEX, TROPONINI in the last 168 hours. BNP (last 3 results) No results for input(s): PROBNP in the last 8760 hours. HbA1C: No results for input(s): HGBA1C in the last 72 hours. CBG: Recent Labs  Lab 01/26/20 1707  GLUCAP 81   Lipid Profile: No results for input(s): CHOL, HDL, LDLCALC, TRIG, CHOLHDL, LDLDIRECT in  the last 72 hours. Thyroid Function Tests: No results for input(s): TSH, T4TOTAL, FREET4, T3FREE, THYROIDAB in the last 72 hours. Anemia Panel: No results for input(s): VITAMINB12, FOLATE, FERRITIN, TIBC, IRON, RETICCTPCT in the last 72 hours.    Radiology Studies: I have reviewed all of the imaging during this hospital visit personally     Scheduled Meds: . aspirin EC  81 mg Oral Daily  . metoCLOPramide (REGLAN) injection  10 mg Intravenous Once  . pantoprazole  40 mg Oral Daily  . pravastatin  40 mg Oral Daily  . ticagrelor  90 mg Oral BID   Continuous Infusions:   LOS: 4 days        Yamilet Mcfayden Gerome Apley, MD

## 2020-01-28 NOTE — Progress Notes (Signed)
1 Day Post-Op   Subjective/Chief Complaint: Pt with no bloody BMs Some flatus Path/CEA pending  Objective: Vital signs in last 24 hours: Temp:  [98.1 F (36.7 C)-98.8 F (37.1 C)] 98.8 F (37.1 C) (04/02 0518) Pulse Rate:  [75-76] 76 (04/02 0518) Resp:  [20] 20 (04/01 2054) BP: (122-128)/(65-68) 122/65 (04/02 0518) SpO2:  [100 %] 100 % (04/01 2054) Last BM Date: 01/27/20  Intake/Output from previous day: 04/01 0701 - 04/02 0700 In: 660 [P.O.:360; I.V.:300] Out: -  Intake/Output this shift: No intake/output data recorded.  Constitutional: No acute distress, conversant, appears states age. Eyes: Anicteric sclerae, moist conjunctiva, no lid lag Lungs: Clear to auscultation bilaterally, normal respiratory effort CV: regular rate and rhythm, no murmurs, no peripheral edema, pedal pulses 2+ GI: Soft, no masses or hepatosplenomegaly, non-tender to palpation Skin: No rashes, palpation reveals normal turgor Psychiatric: appropriate judgment and insight, oriented to person, place, and time   Lab Results:  Recent Labs    01/26/20 0451 01/26/20 1058 01/27/20 1154 01/27/20 1154 01/27/20 2009 01/28/20 0314  WBC 3.7*  --   --   --   --  3.9*  HGB 7.5*   < > 8.7*   < > 7.7* 7.9*  HCT 23.9*   < > 26.6*  --   --  25.1*  PLT 434*  --   --   --   --  515*   < > = values in this interval not displayed.   BMET Recent Labs    01/28/20 0314  NA 142  K 4.1  CL 109  CO2 24  GLUCOSE 96  BUN 7*  CREATININE 0.89  CALCIUM 9.4   PT/INR No results for input(s): LABPROT, INR in the last 72 hours. ABG No results for input(s): PHART, HCO3 in the last 72 hours.  Invalid input(s): PCO2, PO2  Studies/Results: CT CHEST W CONTRAST  Result Date: 01/27/2020 CLINICAL DATA:  Ascending colon mass, compatible with cancer, for staging EXAM: CT CHEST WITH CONTRAST TECHNIQUE: Multidetector CT imaging of the chest was performed during intravenous contrast administration. CONTRAST:  51mL  OMNIPAQUE IOHEXOL 300 MG/ML  SOLN COMPARISON:  Chest radiographs dated 01/20/2012 FINDINGS: Cardiovascular: The heart is normal in size. No pericardial effusion. No evidence of thoracic aortic aneurysm. Mild atherosclerotic calcifications of the aortic root/arch. Mild coronary atherosclerosis of the LAD and right coronary artery. Mediastinum/Nodes: No suspicious mediastinal lymphadenopathy. Visualized thyroid is notable for a 9 mm calcified right thyroid nodule, benign. No followup recommended (ref: J Am Coll Radiol. 2015 Feb;12(2): 143-50). Lungs/Pleura: Lungs are clear. No suspicious pulmonary nodules. No focal consolidation. No pleural effusion or pneumothorax. Upper Abdomen: Visualized upper abdomen is unremarkable, noting mild vascular calcifications. Musculoskeletal: Mild degenerative changes of the visualized thoracolumbar spine. IMPRESSION: No evidence of metastatic disease in the chest. Aortic Atherosclerosis (ICD10-I70.0). Electronically Signed   By: Julian Hy M.D.   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 - Ideally would need to be off Brilinta 24-48 hrs prior to surgery.  Would like to plan for surgery Mon/Tues of next week if can be off Brilinta.  Will await IM plans for brilinta   LOS: 4  days    Ralene Ok 01/28/2020

## 2020-01-28 NOTE — Progress Notes (Signed)
   Patient Name: Nancy Tran Date of Encounter: 01/28/2020, 10:30 AM    Subjective  No bleeding Some light-headedness   Objective  BP 122/65 (BP Location: Left Arm)   Pulse 76   Temp 98.8 F (37.1 C) (Oral)   Resp 20   Ht 5\' 7"  (1.702 m)   Wt 84.4 kg   SpO2 100%   BMI 29.13 kg/m  NAD  Path results - ulcer and granulation tissue NO CANCER, NO NEOPLASIA  CBC Latest Ref Rng & Units 01/28/2020 01/27/2020 01/27/2020  WBC 4.0 - 10.5 K/uL 3.9(L) - -  Hemoglobin 12.0 - 15.0 g/dL 7.9(L) 7.7(L) 8.7(L)  Hematocrit 36.0 - 46.0 % 25.1(L) - 26.6(L)  Platelets 150 - 400 K/uL 515(H) - -       Assessment and Plan  Ascending colon mass - I suspected cancer but fortunately was wrong - so I guess this is sequelae of diverticulitis Reviewed w/ patient and husband and Dr. Cathlean Sauer   Our plan is resume Brilinta + stay on ASA given ICA aneurysm pipeline embolization  Observe and support - transfuse? Iron tx - since ferritin was only 53 on 3/19 I would do feraheme x 1  Treat 2 more weeks abx for diverticulitis  Home when no sig bleeding   See her primary GI [name] Comment for f/u in [facility] - he can determine f/u though I think could start w/ CT  Will keep on my list and am available though no specific GI intervention otherwise now    Gatha Mayer, MD, [facility] 01/28/2020 10:30 AM

## 2020-01-29 DIAGNOSIS — R55 Syncope and collapse: Secondary | ICD-10-CM

## 2020-01-29 LAB — HEMOGLOBIN AND HEMATOCRIT, BLOOD
HCT: 27.5 % — ABNORMAL LOW (ref 36.0–46.0)
Hemoglobin: 8.5 g/dL — ABNORMAL LOW (ref 12.0–15.0)

## 2020-01-29 LAB — CEA: CEA: 0.6 ng/mL (ref 0.0–4.7)

## 2020-01-29 MED ORDER — METRONIDAZOLE 500 MG PO TABS: 500.0000 mg | ORAL_TABLET | Freq: Three times a day (TID) | ORAL | 0 refills | Status: AC

## 2020-01-29 MED ORDER — CIPROFLOXACIN HCL 250 MG PO TABS
250.0000 mg | ORAL_TABLET | Freq: Two times a day (BID) | ORAL | Status: DC
  Administered 2020-01-29: 250 mg via ORAL
  Filled 2020-01-29: qty 1

## 2020-01-29 MED ORDER — CIPROFLOXACIN HCL 250 MG PO TABS: 250.0000 mg | ORAL_TABLET | Freq: Two times a day (BID) | ORAL | 0 refills | Status: AC

## 2020-01-29 NOTE — Discharge Summary (Signed)
Physician Discharge Summary  Nancy Tran [id number] DOB: 1956-08-18 DOA: 01/23/2020  PCP: Lilian Coma., MD  Admit date: 01/23/2020 Discharge date: 01/29/2020  Admitted From: Home  Disposition:  Home   Recommendations for Outpatient Follow-up and new medication changes:  1. Follow up with Dr. Valora Piccolo in 7 days. 2. Continue antibiotic therapy with ciprofloxacin and metronidazole for 10 more days. 3. Continue aspirin and ticagrelor 4. Follow up with GI in 2 weeks.   Home Health: no   Equipment/Devices: no    Discharge Condition: stable  CODE STATUS: full  Diet recommendation: heart healthy   Brief/Interim Summary: Patient was admitted to the hospital working diagnosis of acute blood loss anemia due to lower GI bleed, complicated with inflammatory mass at the ascending colon.   64 year old female who presented with hematochezia and syncope. Recent hospitalization for diverticulitis and lower GI bleed March 14 of March 23. She also has hypertension, dyslipidemia and status post left paraclinoid ICA stent placement (12/09/2019, on aspirin and ticagrelor). Reported to have hematochezia, dark red blood in her stools x1, she also developed a syncope episode preceded by lightheadedness and weakness. When EMS arrived her blood pressure was low. In the emergency department after intravenous fluids her blood pressure was 125/66, heart rate 89, respiratory rate 14, temperature 98.2, oxygen saturation 100%,her lungs were clear to auscultation bilaterally, heart S1 and S2  present rhythmic, soft abdomenandno lower extremity edema Sodium 132, potassium 4.0, chloride 99, bicarb 23, glucose 105, BUN 13, creatinine 1.13, white count 4.3, hemoglobin 8.0, Hct 25.2, platelets 525.Head CT negative for acute changes.EKG 78 bpm, normal axis, normal intervals, sinus rhythm, positive PACs, J-point elevation precordial leads, no ST segment or T wave changes.  Patient received 1 unit packed red blood  cells and underwent tagged RBC study which was positive. 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  Preliminary pathology negative for malignancy. Patient was resumed on dual antiplatelet therapy and  on antibiotics.   Patient will take antibiotic therapy for 10 more days, monitor for sings of recurrent bleeding. Follow up with GI as outpatient.   1.  Acute blood loss anemia due to lower GI bleed, new ascending colonic mass.  Patient was admitted to the medical ward, she received supportive medical therapy with intravenous fluids, and antiacids.  Pathology from ascending colon mass was positive for inflamed colonic mucosa, exudate and granulation tissue consistent with ulcer.  No edematous change or carcinoma noted.  Patient's diet was advanced with good toleration, her hemoglobin and hematocrit remained stable, at discharge her hemoglobin is 8.5 with hematocrit 27.5.  She did receive 1 dose of IV iron while hospitalized.  Patient will continue dual antiplatelet therapy, and 10 more days of antibiotic therapy. Follow-up as an outpatient with gastroenterology, she may need repeat colonoscopy or abdominal CT scan assess resolution of colonic lesion.   2.  Chronic iron deficiency anemia.  Her iron panel showed a iron 33, TIBC 336, transferrin saturation of 10 and ferritin of 53.  Patient received 1 dose of intravenous iron, follow-up on cell count as an outpatient.  3.  Hypertension.  Her blood pressure remained stable during her hospitalization.   4.  History of cerebral aneurysm.  Patient will continue aspirin and ticagrelor as instructed per neurosurgery.  Follow-up as scheduled.  5.  Dyslipidemia.  Continue statin therapy  6.  GERD.  Continue antiacid therapy    Discharge Diagnoses:  Principal Problem:  Acute lower GI bleeding Active Problems:   Cerebral aneurysm   Essential  hypertension   Syncope   GERD (gastroesophageal reflux disease)   Diverticulosis    Discharge Instructions   Allergies as of 01/29/2020      Reactions   Codeine    Darvocet [propoxyphene N-acetaminophen]    Lisinopril Other (See Comments)   Morphine And Related Nausea And Vomiting   Amoxicillin-pot Clavulanate Rash   Other reaction(s): RASH Other reaction(s): RASH   Penicillins Rash      Medication List    STOP taking these medications   loperamide 2 MG tablet Commonly known as: Imodium A-D   ondansetron 4 MG disintegrating tablet Commonly known as: Zofran ODT   promethazine 25 MG tablet Commonly known as: PHENERGAN     TAKE these medications   aspirin 81 MG EC tablet Take 81 mg by mouth daily.   baclofen 10 MG tablet Commonly known as: LIORESAL Take 5-10 mg by mouth every 8 (eight) hours as needed for muscle spasms.   Brilinta 90 MG Tabs tablet Generic drug: ticagrelor Take 90 mg by mouth 2 (two) times daily.   ciprofloxacin 250 MG tablet Commonly known as: CIPRO Take 1 tablet (250 mg total) by mouth 2 (two) times daily for 10 days.   loratadine 10 MG tablet Commonly known as: CLARITIN Take 1 tablet (10 mg total) by mouth daily. What changed:   when to take this  reasons to take this   metroNIDAZOLE 500 MG tablet Commonly known as: FLAGYL Take 1 tablet (500 mg total) by mouth every 8 (eight) hours for 10 days.   multivitamin with minerals Tabs tablet Take 1 tablet by mouth daily.   omeprazole 40 MG capsule Commonly known as: PRILOSEC Take 40 mg by mouth daily.   pravastatin 40 MG tablet Commonly known as: PRAVACHOL Take 40 mg by mouth daily.       Allergies  Allergen Reactions  . Codeine   . Darvocet [Propoxyphene N-Acetaminophen]   . Lisinopril Other (See Comments)  . Morphine And Related Nausea And Vomiting  . Amoxicillin-Pot Clavulanate Rash    Other reaction(s): RASH Other reaction(s): RASH   . Penicillins Rash     Consultations:  Surgery   GI    Procedures/Studies: CT Head Wo Contrast  Result Date: 01/23/2020 CLINICAL DATA:  [place], syncope, bloody stool EXAM: CT HEAD WITHOUT CONTRAST TECHNIQUE: Contiguous axial images were obtained from the base of the skull through the vertex without intravenous contrast. COMPARISON:  11/26/2019 FINDINGS: Brain: No acute infarct or hemorrhage. Lateral ventricles and midline structures are unremarkable. No acute extra-axial fluid collections. No mass effect. Vascular: No hyperdense vessel or unexpected calcification. Skull: Normal. Negative for fracture or focal lesion. Sinuses/Orbits: No acute finding. Other: None IMPRESSION: 1. Stable head CT, no acute intracranial process. Electronically Signed   By: Randa Ngo M.D.   On: 01/23/2020 22:34   CT CHEST W CONTRAST  Result Date: 01/27/2020 CLINICAL DATA:  Ascending colon mass, compatible with cancer, for staging EXAM: CT CHEST WITH CONTRAST TECHNIQUE: Multidetector CT imaging of the chest was performed during intravenous contrast administration. CONTRAST:  59mL OMNIPAQUE IOHEXOL 300 MG/ML  SOLN COMPARISON:  Chest radiographs dated 01/20/2012 FINDINGS: Cardiovascular: The heart is normal in size. No pericardial effusion. No evidence of thoracic aortic aneurysm. Mild atherosclerotic calcifications of the aortic root/arch. Mild coronary atherosclerosis of the LAD and right coronary artery. Mediastinum/Nodes: No suspicious mediastinal lymphadenopathy. Visualized thyroid is notable for a 9 mm calcified right thyroid  nodule, benign. No followup recommended (ref: J Am Coll Radiol. 2015 Feb;12(2): 143-50). Lungs/Pleura: Lungs are clear. No suspicious pulmonary nodules. No focal consolidation. No pleural effusion or pneumothorax. Upper Abdomen: Visualized upper abdomen is unremarkable, noting mild vascular calcifications. Musculoskeletal: Mild degenerative changes of the visualized thoracolumbar spine. IMPRESSION: No evidence of  metastatic disease in the chest. Aortic Atherosclerosis (ICD10-I70.0). Electronically Signed   By: Julian Hy M.D.   On: 01/27/2020 20:35   NM GI Blood Loss  Result Date: 01/25/2020 CLINICAL DATA:  GI bleed.  Patient on blood thinners.  Bloody BM. EXAM: NUCLEAR MEDICINE GASTROINTESTINAL BLEEDING SCAN TECHNIQUE: Sequential abdominal images were obtained following intravenous administration of Tc-18m labeled red blood cells. RADIOPHARMACEUTICALS:  22.8 mCi Tc-61m pertechnetate in-vitro labeled red cells. COMPARISON:  None. FINDINGS: During the first hour of imaging there is a focal area of increased radiotracer uptake localizing to the right colon. Over time, this area increases in intensity. On the second hour there is antegrade progression of the radiopharmaceutical conforming to the expected course of the ascending colon, transverse colon and descending colon. Radiotracer activity extends to the distal descending colon. IMPRESSION: 1. Examination is positive for active GI bleed. The source of the bleed is felt to be proximal right colon. 2. Critical Value/emergent results were called by telephone at the time of interpretation on 01/25/2020 at 5:11 pm to provider [facility] , who verbally acknowledged these results. Electronically Signed   By: Kerby Moors M.D.   On: 01/25/2020 17:11   CT ABDOMEN PELVIS W CONTRAST  Result Date: 01/09/2020 CLINICAL DATA:  Sudden onset abdominal pain EXAM: CT ABDOMEN AND PELVIS WITH CONTRAST TECHNIQUE: Multidetector CT imaging of the abdomen and pelvis was performed using the standard protocol following bolus administration of intravenous contrast. CONTRAST:  151mL OMNIPAQUE IOHEXOL 300 MG/ML  SOLN COMPARISON:  CT abdomen pelvis 03/29/2013 (report only) FINDINGS: Lower chest: Lung bases are clear. Normal heart size. No pericardial effusion. Hepatobiliary: No focal liver abnormality is seen. No gallstones, gallbladder wall thickening, or biliary dilatation. Pancreas:  Unremarkable. No pancreatic ductal dilatation or surrounding inflammatory changes. Spleen: Normal in size without focal abnormality. Adrenals/Urinary Tract: Adrenal glands are unremarkable. Kidneys are normal, without renal calculi, focal lesion, or hydronephrosis. Bladder is unremarkable. Stomach/Bowel: Distal esophagus, stomach and duodenal sweep are unremarkable. No small bowel wall thickening or dilatation. No evidence of obstruction. There is circumferential thickening of the ascending colon with more focal phlegmonous change which appears to be centered on several right-sided colonic diverticula (2/45, 5/33, 6/33). No extraluminal gas or fluid collection is seen. Minimal thickening at the appendiceal base with a more normal appearing appendiceal tip favored to be reactive to the more focal colonic process. Distal colon is unremarkable. Vascular/Lymphatic: Atherosclerotic plaque within the normal caliber abdominal aorta and branch vessels. Few reactive lymph nodes in the right lower quadrant. No pathologically enlarged nodes in the abdomen or pelvis. Reproductive: Normal appearance of the uterus and adnexal structures. Other: No abdominopelvic free fluid or free gas. No bowel containing hernias. Musculoskeletal: Mild grade 1 anterolisthesis L4 on 5 without pars defects. Minimal discogenic and facet degenerative change maximal L5-S1. No acute osseous abnormality or suspicious osseous lesion. IMPRESSION: 1. Circumferential thickening of the ascending colon with more focal phlegmonous change which appears to be centered on several right-sided colonic diverticula. No extraluminal gas or fluid collection is seen to suggest perforation. Findings are most suggestive of acute diverticulitis. 2. Minimal thickening at the appendiceal base with a more normal appearing appendiceal tip favored  to be reactive to the above process. 3. Mild grade 1 anterolisthesis L4 on 5 without pars defects. 4. Aortic Atherosclerosis  (ICD10-I70.0). Electronically Signed   By: Lovena Le M.D.   On: 01/09/2020 22:01   IR Angiogram Visceral Selective  Result Date: 01/26/2020 INDICATION: 64 year old female status post treatment of intracranial aneurysms with flow diverting (pipeline) stents at the beginning of the month. She has been on Brilinta and aspirin since that time and cannot, the dual antiplatelet therapy for fear of thrombosis of the recently placed flow diverting stents which could result in a catastrophic stroke. She was recently diagnosed with ascending diverticulitis, was treated and discharged on 01/18/2020. She now presents with lower GI bleeding and acute on chronic anemia requiring transfusion. Nuclear medicine tagged red blood cell bleeding study localizes the bleeding to the ascending colon. She now presents for angiography and possible embolization. EXAM: IR ULTRASOUND GUIDANCE VASC ACCESS RIGHT; SELECTIVE VISCERAL ARTERIOGRAPHY; ADDITIONAL ARTERIOGRAPHY 1. Ultrasound-guided vascular access left common femoral artery 2. Catheterization of the celiac artery with arteriogram 3. Catheterization the superior mesenteric artery with arteriogram 4. Catheterization of the middle colic artery with arteriogram 5. Catheterization of the right colic artery with arteriogram 6. Catheterization of ileal branch with arteriogram MEDICATIONS: None ANESTHESIA/SEDATION: Moderate (conscious) sedation was employed during this procedure. A total of Versed 5 mg and Fentanyl 150 mcg was administered intravenously. Moderate Sedation Time: 49 minutes. The patient's level of consciousness and vital signs were monitored continuously by radiology nursing throughout the procedure under my direct supervision. CONTRAST:  13mL OMNIPAQUE IOHEXOL 300 MG/ML SOLN, 75mL OMNIPAQUE IOHEXOL 300 MG/ML SOLN, 49mL OMNIPAQUE IOHEXOL 300 MG/ML SOLN FLUOROSCOPY TIME:  Fluoroscopy Time: 6 minutes 48 seconds (2151 mGy). COMPLICATIONS: None immediate. PROCEDURE: Informed  consent was obtained from the patient following explanation of the procedure, risks, benefits and alternatives. The patient understands, agrees and consents for the procedure. All questions were addressed. A time out was performed prior to the initiation of the procedure. Maximal barrier sterile technique utilized including caps, mask, sterile gowns, sterile gloves, large sterile drape, hand hygiene, and Betadine prep. The right groin was interrogated with ultrasound. The common femoral artery is patent. There is a metallic clip anterior to the common femoral artery as well as extensive thickening of the soft tissues over the common femoral artery. An attempt was made to puncture the artery, however the inflammatory changes are extremely scirrhous and it was difficult to pass the needle. Therefore, the decision was made to proceed with a left common femoral access. The left common femoral artery was interrogated with ultrasound and found to be widely patent. An image was obtained and stored for the medical record. Local anesthesia was attained by infiltration with 1% lidocaine. A small dermatotomy was made. Under real-time sonographic guidance, the vessel was punctured with a 21 gauge micropuncture needle. Using standard technique, the initial micro needle was exchanged over a 0.018 micro wire for a transitional 4 [place] micro sheath. The micro sheath was then exchanged over a 0.035 wire for a 5 French vascular sheath. A C2 cobra catheter was advanced over a Bentson wire into the abdominal aorta. The catheter was used to select the celiac axis. A celiac arteriogram was performed. Conventional celiac artery anatomy. No evidence of replaced middle colic artery. No evidence of active bleeding. The C2 cobra catheter was then used to select the superior mesenteric artery. Arteriography was performed. There appears to be a focus of blush or hyperemia in the right hemiabdomen in the region  of the ascending colon. The  decision was made to pursue more selective catheterization. Therefore, a renegade STC microcatheter was advanced over a Fathom 16 wire. An initial branch was selected and arteriography performed. This is an ileal branch artery. No evidence of active bleeding. The microcatheter was brought back into the main SMA. The next branch selected was the middle colic artery. Arteriography was performed with excellent opacification of the hepatic flexure of the. No evidence of vascular abnormality or active bleeding. The next artery selected was the right colic artery. Arteriography was performed. There is a several cm segment of hyperemia in the mid ascending colon. However, there is no evidence of active bleeding or arteriovenous malformation. Microcatheter was advanced more distally in the right colic artery an additional magnified arteriography was performed confirming regional hyperemia without evidence of active hemorrhage. At this point, mild Gel-Foam embolization of the region was considered. However, this is a fairly large region of the colon and such embolization could result in ischemic colitis or delay healing of the underlying colitis. Given that there is no active bleeding at this time and that the patient is hemodynamically stable, no embolization was performed. The catheters were removed. Hemostasis was attained with the assistance of a 6 French Angio-Seal device. The patient tolerated the procedure well. IMPRESSION: 1. No active bleeding at the time of arteriography. 2. There is a several cm segment of regional hyperemia in the mid ascending colon which corresponds with the site of active colitis on the prior CT scan. This is almost certainly the source of the patient's bleeding and either represents recurrent active colitis, or potentially an underlying colonic neoplasm. PLAN: 1. Embolization was not performed at this time as embolization of such a large region of hyperemia could result in ischemic colitis  and further complication. Regional embolization could be considered if the patient has a life-threatening bleeding event or becomes otherwise unstable. 2. Recommend treatment for active colitis to be followed by colonoscopy to exclude an underlying neoplastic process in the region. 3. Continue to trend H and H and transfuse as needed. Bleeding should cease once the region of active colitis resolves. Signed, Criselda Peaches, MD, [facility] Electronically Signed   By: Jacqulynn Cadet M.D.   On: 01/26/2020 08:41   IR Angiogram Visceral Selective  Result Date: 01/26/2020 INDICATION: 64 year old female status post treatment of intracranial aneurysms with flow diverting (pipeline) stents at the beginning of the month. She has been on Brilinta and aspirin since that time and cannot, the dual antiplatelet therapy for fear of thrombosis of the recently placed flow diverting stents which could result in a catastrophic stroke. She was recently diagnosed with ascending diverticulitis, was treated and discharged on 01/18/2020. She now presents with lower GI bleeding and acute on chronic anemia requiring transfusion. Nuclear medicine tagged red blood cell bleeding study localizes the bleeding to the ascending colon. She now presents for angiography and possible embolization. EXAM: IR ULTRASOUND GUIDANCE VASC ACCESS RIGHT; SELECTIVE VISCERAL ARTERIOGRAPHY; ADDITIONAL ARTERIOGRAPHY 1. Ultrasound-guided vascular access left common femoral artery 2. Catheterization of the celiac artery with arteriogram 3. Catheterization the superior mesenteric artery with arteriogram 4. Catheterization of the middle colic artery with arteriogram 5. Catheterization of the right colic artery with arteriogram 6. Catheterization of ileal branch with arteriogram MEDICATIONS: None ANESTHESIA/SEDATION: Moderate (conscious) sedation was employed during this procedure. A total of  Versed 5 mg and Fentanyl 150 mcg was administered intravenously. Moderate Sedation Time: 49 minutes. The  patient's level of consciousness and vital signs were monitored continuously by radiology nursing throughout the procedure under my direct supervision. CONTRAST:  51mL OMNIPAQUE IOHEXOL 300 MG/ML SOLN, 40mL OMNIPAQUE IOHEXOL 300 MG/ML SOLN, 68mL OMNIPAQUE IOHEXOL 300 MG/ML SOLN FLUOROSCOPY TIME:  Fluoroscopy Time: 6 minutes 48 seconds (2151 mGy). COMPLICATIONS: None immediate. PROCEDURE: Informed consent was obtained from the patient following explanation of the procedure, risks, benefits and alternatives. The patient understands, agrees and consents for the procedure. All questions were addressed. A time out was performed prior to the initiation of the procedure. Maximal barrier sterile technique utilized including caps, mask, sterile gowns, sterile gloves, large sterile drape, hand hygiene, and Betadine prep. The right groin was interrogated with ultrasound. The common femoral artery is patent. There is a metallic clip anterior to the common femoral artery as well as extensive thickening of the soft tissues over the common femoral artery. An attempt was made to puncture the artery, however the inflammatory changes are extremely scirrhous and it was difficult to pass the needle. Therefore, the decision was made to proceed with a left common femoral access. The left common femoral artery was interrogated with ultrasound and found to be widely patent. An image was obtained and stored for the medical record. Local anesthesia was attained by infiltration with 1% lidocaine. A small dermatotomy was made. Under real-time sonographic guidance, the vessel was punctured with a 21 gauge micropuncture needle. Using standard technique, the initial micro needle was exchanged over a 0.018 micro wire for a transitional 4 [place] micro sheath. The micro sheath was then exchanged over a 0.035 wire for a 5 French vascular sheath. A  C2 cobra catheter was advanced over a Bentson wire into the abdominal aorta. The catheter was used to select the celiac axis. A celiac arteriogram was performed. Conventional celiac artery anatomy. No evidence of replaced middle colic artery. No evidence of active bleeding. The C2 cobra catheter was then used to select the superior mesenteric artery. Arteriography was performed. There appears to be a focus of blush or hyperemia in the right hemiabdomen in the region of the ascending colon. The decision was made to pursue more selective catheterization. Therefore, a renegade STC microcatheter was advanced over a Fathom 16 wire. An initial branch was selected and arteriography performed. This is an ileal branch artery. No evidence of active bleeding. The microcatheter was brought back into the main SMA. The next branch selected was the middle colic artery. Arteriography was performed with excellent opacification of the hepatic flexure of the. No evidence of vascular abnormality or active bleeding. The next artery selected was the right colic artery. Arteriography was performed. There is a several cm segment of hyperemia in the mid ascending colon. However, there is no evidence of active bleeding or arteriovenous malformation. Microcatheter was advanced more distally in the right colic artery an additional magnified arteriography was performed confirming regional hyperemia without evidence of active hemorrhage. At this point, mild Gel-Foam embolization of the region was considered. However, this is a fairly large region of the colon and such embolization could result in ischemic colitis or delay healing of the underlying colitis. Given that there is no active bleeding at this time and that the patient is hemodynamically stable, no embolization was performed. The catheters were removed. Hemostasis was attained with the assistance of a 6 French Angio-Seal device. The patient tolerated the procedure well. IMPRESSION: 1.  No active bleeding at the time of arteriography. 2. There is a several cm segment of regional hyperemia  in the mid ascending colon which corresponds with the site of active colitis on the prior CT scan. This is almost certainly the source of the patient's bleeding and either represents recurrent active colitis, or potentially an underlying colonic neoplasm. PLAN: 1. Embolization was not performed at this time as embolization of such a large region of hyperemia could result in ischemic colitis and further complication. Regional embolization could be considered if the patient has a life-threatening bleeding event or becomes otherwise unstable. 2. Recommend treatment for active colitis to be followed by colonoscopy to exclude an underlying neoplastic process in the region. 3. Continue to trend H and H and transfuse as needed. Bleeding should cease once the region of active colitis resolves. Signed, Criselda Peaches, MD, [facility] Radiology Electronically Signed   By: Jacqulynn Cadet M.D.   On: 01/26/2020 08:41   IR Angiogram Selective Each Additional Vessel  Result Date: 01/26/2020 INDICATION: 64 year old female status post treatment of intracranial aneurysms with flow diverting (pipeline) stents at the beginning of the month. She has been on Brilinta and aspirin since that time and cannot, the dual antiplatelet therapy for fear of thrombosis of the recently placed flow diverting stents which could result in a catastrophic stroke. She was recently diagnosed with ascending diverticulitis, was treated and discharged on 01/18/2020. She now presents with lower GI bleeding and acute on chronic anemia requiring transfusion. Nuclear medicine tagged red blood cell bleeding study localizes the bleeding to the ascending colon. She now presents for angiography and possible embolization. EXAM: IR ULTRASOUND GUIDANCE VASC ACCESS RIGHT; SELECTIVE VISCERAL  ARTERIOGRAPHY; ADDITIONAL ARTERIOGRAPHY 1. Ultrasound-guided vascular access left common femoral artery 2. Catheterization of the celiac artery with arteriogram 3. Catheterization the superior mesenteric artery with arteriogram 4. Catheterization of the middle colic artery with arteriogram 5. Catheterization of the right colic artery with arteriogram 6. Catheterization of ileal branch with arteriogram MEDICATIONS: None ANESTHESIA/SEDATION: Moderate (conscious) sedation was employed during this procedure. A total of Versed 5 mg and Fentanyl 150 mcg was administered intravenously. Moderate Sedation Time: 49 minutes. The patient's level of consciousness and vital signs were monitored continuously by radiology nursing throughout the procedure under my direct supervision. CONTRAST:  76mL OMNIPAQUE IOHEXOL 300 MG/ML SOLN, 22mL OMNIPAQUE IOHEXOL 300 MG/ML SOLN, 24mL OMNIPAQUE IOHEXOL 300 MG/ML SOLN FLUOROSCOPY TIME:  Fluoroscopy Time: 6 minutes 48 seconds (2151 mGy). COMPLICATIONS: None immediate. PROCEDURE: Informed consent was obtained from the patient following explanation of the procedure, risks, benefits and alternatives. The patient understands, agrees and consents for the procedure. All questions were addressed. A time out was performed prior to the initiation of the procedure. Maximal barrier sterile technique utilized including caps, mask, sterile gowns, sterile gloves, large sterile drape, hand hygiene, and Betadine prep. The right groin was interrogated with ultrasound. The common femoral artery is patent. There is a metallic clip anterior to the common femoral artery as well as extensive thickening of the soft tissues over the common femoral artery. An attempt was made to puncture the artery, however the inflammatory changes are extremely scirrhous and it was difficult to pass the needle. Therefore, the decision was made to proceed with a left common femoral access. The left common femoral artery was  interrogated with ultrasound and found to be widely patent. An image was obtained and stored for the medical record. Local anesthesia was attained by infiltration with 1% lidocaine. A small dermatotomy was made. Under real-time sonographic guidance, the vessel was punctured with a 21  gauge micropuncture needle. Using standard technique, the initial micro needle was exchanged over a 0.018 micro wire for a transitional 4 [place] micro sheath. The micro sheath was then exchanged over a 0.035 wire for a 5 French vascular sheath. A C2 cobra catheter was advanced over a Bentson wire into the abdominal aorta. The catheter was used to select the celiac axis. A celiac arteriogram was performed. Conventional celiac artery anatomy. No evidence of replaced middle colic artery. No evidence of active bleeding. The C2 cobra catheter was then used to select the superior mesenteric artery. Arteriography was performed. There appears to be a focus of blush or hyperemia in the right hemiabdomen in the region of the ascending colon. The decision was made to pursue more selective catheterization. Therefore, a renegade STC microcatheter was advanced over a Fathom 16 wire. An initial branch was selected and arteriography performed. This is an ileal branch artery. No evidence of active bleeding. The microcatheter was brought back into the main SMA. The next branch selected was the middle colic artery. Arteriography was performed with excellent opacification of the hepatic flexure of the. No evidence of vascular abnormality or active bleeding. The next artery selected was the right colic artery. Arteriography was performed. There is a several cm segment of hyperemia in the mid ascending colon. However, there is no evidence of active bleeding or arteriovenous malformation. Microcatheter was advanced more distally in the right colic artery an additional magnified arteriography was performed confirming regional hyperemia without evidence of  active hemorrhage. At this point, mild Gel-Foam embolization of the region was considered. However, this is a fairly large region of the colon and such embolization could result in ischemic colitis or delay healing of the underlying colitis. Given that there is no active bleeding at this time and that the patient is hemodynamically stable, no embolization was performed. The catheters were removed. Hemostasis was attained with the assistance of a 6 French Angio-Seal device. The patient tolerated the procedure well. IMPRESSION: 1. No active bleeding at the time of arteriography. 2. There is a several cm segment of regional hyperemia in the mid ascending colon which corresponds with the site of active colitis on the prior CT scan. This is almost certainly the source of the patient's bleeding and either represents recurrent active colitis, or potentially an underlying colonic neoplasm. PLAN: 1. Embolization was not performed at this time as embolization of such a large region of hyperemia could result in ischemic colitis and further complication. Regional embolization could be considered if the patient has a life-threatening bleeding event or becomes otherwise unstable. 2. Recommend treatment for active colitis to be followed by colonoscopy to exclude an underlying neoplastic process in the region. 3. Continue to trend H and H and transfuse as needed. Bleeding should cease once the region of active colitis resolves. Signed, Criselda Peaches, MD, [facility] Radiology Electronically Signed   By: Jacqulynn Cadet M.D.   On: 01/26/2020 08:41   IR Angiogram Selective Each Additional Vessel  Result Date: 01/26/2020 INDICATION: 64 year old female status post treatment of intracranial aneurysms with flow diverting (pipeline) stents at the beginning of the month. She has been on Brilinta and aspirin since that time and cannot, the dual antiplatelet therapy for fear  of thrombosis of the recently placed flow diverting stents which could result in a catastrophic stroke. She was recently diagnosed with ascending diverticulitis, was treated and discharged on 01/18/2020. She now presents with lower GI bleeding and acute on  chronic anemia requiring transfusion. Nuclear medicine tagged red blood cell bleeding study localizes the bleeding to the ascending colon. She now presents for angiography and possible embolization. EXAM: IR ULTRASOUND GUIDANCE VASC ACCESS RIGHT; SELECTIVE VISCERAL ARTERIOGRAPHY; ADDITIONAL ARTERIOGRAPHY 1. Ultrasound-guided vascular access left common femoral artery 2. Catheterization of the celiac artery with arteriogram 3. Catheterization the superior mesenteric artery with arteriogram 4. Catheterization of the middle colic artery with arteriogram 5. Catheterization of the right colic artery with arteriogram 6. Catheterization of ileal branch with arteriogram MEDICATIONS: None ANESTHESIA/SEDATION: Moderate (conscious) sedation was employed during this procedure. A total of Versed 5 mg and Fentanyl 150 mcg was administered intravenously. Moderate Sedation Time: 49 minutes. The patient's level of consciousness and vital signs were monitored continuously by radiology nursing throughout the procedure under my direct supervision. CONTRAST:  42mL OMNIPAQUE IOHEXOL 300 MG/ML SOLN, 59mL OMNIPAQUE IOHEXOL 300 MG/ML SOLN, 2mL OMNIPAQUE IOHEXOL 300 MG/ML SOLN FLUOROSCOPY TIME:  Fluoroscopy Time: 6 minutes 48 seconds (2151 mGy). COMPLICATIONS: None immediate. PROCEDURE: Informed consent was obtained from the patient following explanation of the procedure, risks, benefits and alternatives. The patient understands, agrees and consents for the procedure. All questions were addressed. A time out was performed prior to the initiation of the procedure. Maximal barrier sterile technique utilized including caps, mask, sterile gowns, sterile gloves, large sterile drape, hand  hygiene, and Betadine prep. The right groin was interrogated with ultrasound. The common femoral artery is patent. There is a metallic clip anterior to the common femoral artery as well as extensive thickening of the soft tissues over the common femoral artery. An attempt was made to puncture the artery, however the inflammatory changes are extremely scirrhous and it was difficult to pass the needle. Therefore, the decision was made to proceed with a left common femoral access. The left common femoral artery was interrogated with ultrasound and found to be widely patent. An image was obtained and stored for the medical record. Local anesthesia was attained by infiltration with 1% lidocaine. A small dermatotomy was made. Under real-time sonographic guidance, the vessel was punctured with a 21 gauge micropuncture needle. Using standard technique, the initial micro needle was exchanged over a 0.018 micro wire for a transitional 4 [place] micro sheath. The micro sheath was then exchanged over a 0.035 wire for a 5 French vascular sheath. A C2 cobra catheter was advanced over a Bentson wire into the abdominal aorta. The catheter was used to select the celiac axis. A celiac arteriogram was performed. Conventional celiac artery anatomy. No evidence of replaced middle colic artery. No evidence of active bleeding. The C2 cobra catheter was then used to select the superior mesenteric artery. Arteriography was performed. There appears to be a focus of blush or hyperemia in the right hemiabdomen in the region of the ascending colon. The decision was made to pursue more selective catheterization. Therefore, a renegade STC microcatheter was advanced over a Fathom 16 wire. An initial branch was selected and arteriography performed. This is an ileal branch artery. No evidence of active bleeding. The microcatheter was brought back into the main SMA. The next branch selected was the middle colic artery. Arteriography was performed with  excellent opacification of the hepatic flexure of the. No evidence of vascular abnormality or active bleeding. The next artery selected was the right colic artery. Arteriography was performed. There is a several cm segment of hyperemia in the mid ascending colon. However, there is no evidence of active bleeding or arteriovenous malformation. Microcatheter was advanced more distally  in the right colic artery an additional magnified arteriography was performed confirming regional hyperemia without evidence of active hemorrhage. At this point, mild Gel-Foam embolization of the region was considered. However, this is a fairly large region of the colon and such embolization could result in ischemic colitis or delay healing of the underlying colitis. Given that there is no active bleeding at this time and that the patient is hemodynamically stable, no embolization was performed. The catheters were removed. Hemostasis was attained with the assistance of a 6 French Angio-Seal device. The patient tolerated the procedure well. IMPRESSION: 1. No active bleeding at the time of arteriography. 2. There is a several cm segment of regional hyperemia in the mid ascending colon which corresponds with the site of active colitis on the prior CT scan. This is almost certainly the source of the patient's bleeding and either represents recurrent active colitis, or potentially an underlying colonic neoplasm. PLAN: 1. Embolization was not performed at this time as embolization of such a large region of hyperemia could result in ischemic colitis and further complication. Regional embolization could be considered if the patient has a life-threatening bleeding event or becomes otherwise unstable. 2. Recommend treatment for active colitis to be followed by colonoscopy to exclude an underlying neoplastic process in the region. 3. Continue to trend H and H and transfuse as needed. Bleeding should cease once the region of active colitis resolves.  Signed, Criselda Peaches, MD, [place] [facility] Radiology Electronically Signed   By: Jacqulynn Cadet M.D.   On: 01/26/2020 08:41   IR US Guide Vasc Access Right  Result Date: 01/26/2020 INDICATION: 64 year old female status post treatment of intracranial aneurysms with flow diverting (pipeline) stents at the beginning of the month. She has been on Brilinta and aspirin since that time and cannot, the dual antiplatelet therapy for fear of thrombosis of the recently placed flow diverting stents which could result in a catastrophic stroke. She was recently diagnosed with ascending diverticulitis, was treated and discharged on 01/18/2020. She now presents with lower GI bleeding and acute on chronic anemia requiring transfusion. Nuclear medicine tagged red blood cell bleeding study localizes the bleeding to the ascending colon. She now presents for angiography and possible embolization. EXAM: IR ULTRASOUND GUIDANCE VASC ACCESS RIGHT; SELECTIVE VISCERAL ARTERIOGRAPHY; ADDITIONAL ARTERIOGRAPHY 1. Ultrasound-guided vascular access left common femoral artery 2. Catheterization of the celiac artery with arteriogram 3. Catheterization the superior mesenteric artery with arteriogram 4. Catheterization of the middle colic artery with arteriogram 5. Catheterization of the right colic artery with arteriogram 6. Catheterization of ileal branch with arteriogram MEDICATIONS: None ANESTHESIA/SEDATION: Moderate (conscious) sedation was employed during this procedure. A total of Versed 5 mg and Fentanyl 150 mcg was administered intravenously. Moderate Sedation Time: 49 minutes. The patient's level of consciousness and vital signs were monitored continuously by radiology nursing throughout the procedure under my direct supervision. CONTRAST:  16mL OMNIPAQUE IOHEXOL 300 MG/ML SOLN, 18mL OMNIPAQUE IOHEXOL 300 MG/ML SOLN, 60mL OMNIPAQUE IOHEXOL 300 MG/ML SOLN FLUOROSCOPY TIME:   Fluoroscopy Time: 6 minutes 48 seconds (2151 mGy). COMPLICATIONS: None immediate. PROCEDURE: Informed consent was obtained from the patient following explanation of the procedure, risks, benefits and alternatives. The patient understands, agrees and consents for the procedure. All questions were addressed. A time out was performed prior to the initiation of the procedure. Maximal barrier sterile technique utilized including caps, mask, sterile gowns, sterile gloves, large sterile drape, hand hygiene, and Betadine prep. The right groin was interrogated with  ultrasound. The common femoral artery is patent. There is a metallic clip anterior to the common femoral artery as well as extensive thickening of the soft tissues over the common femoral artery. An attempt was made to puncture the artery, however the inflammatory changes are extremely scirrhous and it was difficult to pass the needle. Therefore, the decision was made to proceed with a left common femoral access. The left common femoral artery was interrogated with ultrasound and found to be widely patent. An image was obtained and stored for the medical record. Local anesthesia was attained by infiltration with 1% lidocaine. A small dermatotomy was made. Under real-time sonographic guidance, the vessel was punctured with a 21 gauge micropuncture needle. Using standard technique, the initial micro needle was exchanged over a 0.018 micro wire for a transitional 4 [place] micro sheath. The micro sheath was then exchanged over a 0.035 wire for a 5 French vascular sheath. A C2 cobra catheter was advanced over a Bentson wire into the abdominal aorta. The catheter was used to select the celiac axis. A celiac arteriogram was performed. Conventional celiac artery anatomy. No evidence of replaced middle colic artery. No evidence of active bleeding. The C2 cobra catheter was then used to select the superior mesenteric artery. Arteriography was performed. There appears to be  a focus of blush or hyperemia in the right hemiabdomen in the region of the ascending colon. The decision was made to pursue more selective catheterization. Therefore, a renegade STC microcatheter was advanced over a Fathom 16 wire. An initial branch was selected and arteriography performed. This is an ileal branch artery. No evidence of active bleeding. The microcatheter was brought back into the main SMA. The next branch selected was the middle colic artery. Arteriography was performed with excellent opacification of the hepatic flexure of the. No evidence of vascular abnormality or active bleeding. The next artery selected was the right colic artery. Arteriography was performed. There is a several cm segment of hyperemia in the mid ascending colon. However, there is no evidence of active bleeding or arteriovenous malformation. Microcatheter was advanced more distally in the right colic artery an additional magnified arteriography was performed confirming regional hyperemia without evidence of active hemorrhage. At this point, mild Gel-Foam embolization of the region was considered. However, this is a fairly large region of the colon and such embolization could result in ischemic colitis or delay healing of the underlying colitis. Given that there is no active bleeding at this time and that the patient is hemodynamically stable, no embolization was performed. The catheters were removed. Hemostasis was attained with the assistance of a 6 French Angio-Seal device. The patient tolerated the procedure well. IMPRESSION: 1. No active bleeding at the time of arteriography. 2. There is a several cm segment of regional hyperemia in the mid ascending colon which corresponds with the site of active colitis on the prior CT scan. This is almost certainly the source of the patient's bleeding and either represents recurrent active colitis, or potentially an underlying colonic neoplasm. PLAN: 1. Embolization was not performed at  this time as embolization of such a large region of hyperemia could result in ischemic colitis and further complication. Regional embolization could be considered if the patient has a life-threatening bleeding event or becomes otherwise unstable. 2. Recommend treatment for active colitis to be followed by colonoscopy to exclude an underlying neoplastic process in the region. 3. Continue to trend H and H and transfuse as needed. Bleeding should cease once the region of active  colitis resolves. Signed, Criselda Peaches, MD, [place] [facility] Radiology Electronically Signed   By: Jacqulynn Cadet M.D.   On: 01/26/2020 08:41      Procedures: Colonoscopy   Subjective: Patient is feeling better, no further bleeding, no nausea or vomiting, no chest pain or dyspnea, she has been out of the bed to chair.   Discharge Exam: Vitals:   01/28/20 2034 01/29/20 0545  BP: 111/73 121/77  Pulse: 90 81  Resp: 18 18  Temp: 98.5 F (36.9 C) 98.4 F (36.9 C)  SpO2: 99% 99%   Vitals:   01/28/20 0518 01/28/20 1213 01/28/20 2034 01/29/20 0545  BP: 122/65 130/85 111/73 121/77  Pulse: 76 85 90 81  Resp:  19 18 18   Temp: 98.8 F (37.1 C) 99.3 F (37.4 C) 98.5 F (36.9 C) 98.4 F (36.9 C)  TempSrc: Oral Oral Oral Oral  SpO2:  100% 99% 99%  Weight:      Height:        General: Not in pain or dyspnea.  Neurology: Awake and alert, non focal  E ENT: mild pallor, no icterus, oral mucosa moist Cardiovascular: No JVD. S1-S2 present, rhythmic, no gallops, rubs, or murmurs. No lower extremity edema. Pulmonary: positive breath sounds bilaterally, adequate air movement, no wheezing, rhonchi or rales. Gastrointestinal. Abdomen with no organomegaly, non tender, no rebound or guarding Skin. No rashes Musculoskeletal: no joint deformities   The results of significant diagnostics from this hospitalization (including imaging, microbiology, ancillary and laboratory)  are listed below for reference.     Microbiology: Recent Results (from the past 240 hour(s))  SARS CORONAVIRUS 2 (TAT 6-24 HRS) Nasopharyngeal Nasopharyngeal Swab     Status: None   Collection Time: 01/24/20 12:49 AM   Specimen: Nasopharyngeal Swab  Result Value Ref Range Status   SARS Coronavirus 2 NEGATIVE NEGATIVE Final    Comment: (NOTE) SARS-CoV-2 target nucleic acids are NOT DETECTED. The SARS-CoV-2 RNA is generally detectable in upper and lower respiratory specimens during the acute phase of infection. Negative results do not preclude SARS-CoV-2 infection, do not rule out co-infections with other pathogens, and should not be used as the sole basis for treatment or other patient management decisions. Negative results must be combined with clinical observations, patient history, and epidemiological information. The expected result is Negative. Fact Sheet for Patients: SugarRoll.be Fact Sheet for Healthcare Providers: https://www.woods-mathews.com/ This test is not yet approved or cleared by the [place] FDA and  has been authorized for detection and/or diagnosis of SARS-CoV-2 by FDA under an Emergency Use Authorization (EUA). This EUA will remain  in effect (meaning this test can be used) for the duration of the COVID-19 declaration under Section 56 4(b)(1) of the Act, 21 U.S.C. section 360bbb-3(b)(1), unless the authorization is terminated or revoked sooner. Performed at [facility], [address], [place], [place] [address]      Labs: BNP (last 3 results) No results for input(s): BNP in the last 8760 hours. Basic Metabolic Panel: Recent Labs  Lab 01/23/20 2112 01/25/20 0339 01/28/20 0314  NA 132* 141 142  K 4.0 4.2 4.1  CL 99 110 109  CO2 23 24 24   GLUCOSE 105* 98 96  BUN 13 10 7*  CREATININE 1.13* 0.86 0.89  CALCIUM 9.1 8.9 9.4   Liver Function Tests: No results for input(s): AST, ALT, ALKPHOS,  BILITOT, PROT, ALBUMIN in the last 168 hours. No results for input(s): LIPASE, AMYLASE in the last 168  hours. No results for input(s): AMMONIA in the last 168 hours. CBC: Recent Labs  Lab 01/23/20 2112 01/24/20 0459 01/25/20 0339 01/25/20 1128 01/26/20 0451 01/26/20 1058 01/26/20 2337 01/26/20 2337 01/27/20 0543 01/27/20 1154 01/27/20 2009 01/28/20 0314 01/29/20 0415  WBC 4.3  --  3.2*  --  3.7*  --   --   --   --   --   --  3.9*  --   NEUTROABS 2.6  --   --   --   --   --   --   --   --   --   --  2.2  --   HGB 8.0*   < > 6.8*   < > 7.5*   < > 8.7*   < > 9.3* 8.7* 7.7* 7.9* 8.5*  HCT 25.2*   < > 22.3*   < > 23.9*   < > 27.8*  --  29.8* 26.6*  --  25.1* 27.5*  MCV 84.0  --  85.1  --  85.4  --   --   --   --   --   --  86.9  --   PLT 525*  --  477*  --  434*  --   --   --   --   --   --  515*  --    < > = values in this interval not displayed.   Cardiac Enzymes: No results for input(s): CKTOTAL, CKMB, CKMBINDEX, TROPONINI in the last 168 hours. BNP: Invalid input(s): POCBNP CBG: Recent Labs  Lab 01/26/20 1707  GLUCAP 81   D-Dimer No results for input(s): DDIMER in the last 72 hours. Hgb A1c No results for input(s): HGBA1C in the last 72 hours. Lipid Profile No results for input(s): CHOL, HDL, LDLCALC, TRIG, CHOLHDL, LDLDIRECT in the last 72 hours. Thyroid function studies No results for input(s): TSH, T4TOTAL, T3FREE, THYROIDAB in the last 72 hours.  Invalid input(s): FREET3 Anemia work up No results for input(s): VITAMINB12, FOLATE, FERRITIN, TIBC, IRON, RETICCTPCT in the last 72 hours. Urinalysis    Component Value Date/Time   COLORURINE YELLOW 01/09/2020 2245   APPEARANCEUR CLEAR 01/09/2020 2245   LABSPEC 1.020 01/09/2020 2245   PHURINE 5.5 01/09/2020 2245   GLUCOSEU NEGATIVE 01/09/2020 2245   HGBUR NEGATIVE 01/09/2020 2245   BILIRUBINUR NEGATIVE 01/09/2020 2245   KETONESUR NEGATIVE 01/09/2020 2245   PROTEINUR NEGATIVE 01/09/2020 2245   UROBILINOGEN 0.2  03/28/2013 1921   NITRITE NEGATIVE 01/09/2020 2245   LEUKOCYTESUR NEGATIVE 01/09/2020 2245   Sepsis Labs Invalid input(s): PROCALCITONIN,  WBC,  LACTICIDVEN Microbiology Recent Results (from the past 240 hour(s))  SARS CORONAVIRUS 2 (TAT 6-24 HRS) Nasopharyngeal Nasopharyngeal Swab     Status: None   Collection Time: 01/24/20 12:49 AM   Specimen: Nasopharyngeal Swab  Result Value Ref Range Status   SARS Coronavirus 2 NEGATIVE NEGATIVE Final    Comment: (NOTE) SARS-CoV-2 target nucleic acids are NOT DETECTED. The SARS-CoV-2 RNA is generally detectable in upper and lower respiratory specimens during the acute phase of infection. Negative results do not preclude SARS-CoV-2 infection, do not rule out co-infections with other pathogens, and should not be used as the sole basis for treatment or other patient management decisions. Negative results must be combined with clinical observations, patient history, and epidemiological information. The expected result is Negative. Fact Sheet for Patients: SugarRoll.be Fact Sheet for Healthcare Providers: https://www.woods-mathews.com/ This test is not yet approved or cleared by the [place] FDA and  has been authorized for detection and/or diagnosis of SARS-CoV-2 by FDA under an Emergency Use Authorization (EUA). This EUA will remain  in effect (meaning this test can be used) for the duration of the COVID-19 declaration under Section 56 4(b)(1) of the Act, 21 U.S.C. section 360bbb-3(b)(1), unless the authorization is terminated or revoked sooner. Performed at [facility], [place] [address] [place], [place] [address]      Time coordinating discharge: 45 minutes  SIGNED:   Tawni Millers, MD  [facility] 01/29/2020, 11:28 AM

## 2020-01-29 NOTE — Progress Notes (Signed)
2 Days Post-Op   Subjective/Chief Complaint: Path results noted   Objective: Vital signs in last 24 hours: Temp:  [98.4 F (36.9 C)-99.3 F (37.4 C)] 98.4 F (36.9 C) (04/03 0545) Pulse Rate:  [81-90] 81 (04/03 0545) Resp:  [18-19] 18 (04/03 0545) BP: (111-130)/(73-85) 121/77 (04/03 0545) SpO2:  [99 %-100 %] 99 % (04/03 0545) Last BM Date: 01/27/20  Intake/Output from previous day: 04/02 0701 - 04/03 0700 In: 400 [P.O.:200; IV Piggyback:200] Out: -  Intake/Output this shift: No intake/output data recorded.  Constitutional: No acute distress, conversant, appears states age. Eyes: Anicteric sclerae, moist conjunctiva, no lid lag Lungs: Clear to auscultation bilaterally, normal respiratory effort CV: regular rate and rhythm, no murmurs, no peripheral edema, pedal pulses 2+ GI: Soft, no masses or hepatosplenomegaly, non-tender to palpation Skin: No rashes, palpation reveals normal turgor Psychiatric: appropriate judgment and insight, oriented to person, place, and time   Lab Results:  Recent Labs    01/28/20 0314 01/29/20 0415  WBC 3.9*  --   HGB 7.9* 8.5*  HCT 25.1* 27.5*  PLT 515*  --    BMET Recent Labs    01/28/20 0314  NA 142  K 4.1  CL 109  CO2 24  GLUCOSE 96  BUN 7*  CREATININE 0.89  CALCIUM 9.4   PT/INR No results for input(s): LABPROT, INR in the last 72 hours. ABG No results for input(s): PHART, HCO3 in the last 72 hours.  Invalid input(s): PCO2, PO2  Studies/Results: CT CHEST W CONTRAST  Result Date: 01/27/2020 CLINICAL DATA:  Ascending colon mass, compatible with cancer, for staging EXAM: CT CHEST WITH CONTRAST TECHNIQUE: Multidetector CT imaging of the chest was performed during intravenous contrast administration. CONTRAST:  30mL OMNIPAQUE IOHEXOL 300 MG/ML  SOLN COMPARISON:  Chest radiographs dated 01/20/2012 FINDINGS: Cardiovascular: The heart is normal in size. No pericardial effusion. No evidence of thoracic aortic aneurysm. Mild  atherosclerotic calcifications of the aortic root/arch. Mild coronary atherosclerosis of the LAD and right coronary artery. Mediastinum/Nodes: No suspicious mediastinal lymphadenopathy. Visualized thyroid is notable for a 9 mm calcified right thyroid nodule, benign. No followup recommended (ref: J Am Coll Radiol. 2015 Feb;12(2): 143-50). Lungs/Pleura: Lungs are clear. No suspicious pulmonary nodules. No focal consolidation. No pleural effusion or pneumothorax. Upper Abdomen: Visualized upper abdomen is unremarkable, noting mild vascular calcifications. Musculoskeletal: Mild degenerative changes of the visualized thoracolumbar spine. IMPRESSION: No evidence of metastatic disease in the chest. Aortic Atherosclerosis (ICD10-I70.0). Electronically Signed   By: Julian Hy M.D.   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 -Please call if can assist   LOS: 5 days    Ralene Ok 01/29/2020

## 2020-01-31 NOTE — Anesthesia Postprocedure Evaluation (Signed)
Anesthesia Post Note  Patient: Scientific laboratory technician  Procedure(s) Performed: COLONOSCOPY WITH PROPOFOL (N/A ) BIOPSY     Patient location during evaluation: Endoscopy Anesthesia Type: MAC Level of consciousness: awake and alert Pain management: pain level controlled Vital Signs Assessment: post-procedure vital signs reviewed and stable Respiratory status: spontaneous breathing, nonlabored ventilation, respiratory function stable and patient connected to nasal cannula oxygen Cardiovascular status: blood pressure returned to baseline and stable Postop Assessment: no apparent nausea or vomiting Anesthetic complications: no    Last Vitals:  Vitals:   01/29/20 0545 01/29/20 1239  BP: 121/77 117/65  Pulse: 81 76  Resp: 18 20  Temp: 36.9 C 36.6 C  SpO2: 99% 100%    Last Pain:  Vitals:   01/29/20 1239  TempSrc: Oral  PainSc:                  Carrizo Hill S

## 2020-03-05 ENCOUNTER — Other Ambulatory Visit: Payer: Self-pay

## 2020-03-05 ENCOUNTER — Encounter (HOSPITAL_BASED_OUTPATIENT_CLINIC_OR_DEPARTMENT_OTHER): Payer: Self-pay | Admitting: Emergency Medicine

## 2020-03-05 ENCOUNTER — Emergency Department (HOSPITAL_BASED_OUTPATIENT_CLINIC_OR_DEPARTMENT_OTHER)
Admission: EM | Admit: 2020-03-05 | Discharge: 2020-03-05 | Disposition: A | Discharge status: Home / Self Care | Payer: BC Managed Care – PPO | Attending: Emergency Medicine | Admitting: Emergency Medicine

## 2020-03-05 DIAGNOSIS — I10 Essential (primary) hypertension: Secondary | ICD-10-CM | POA: Diagnosis not present

## 2020-03-05 DIAGNOSIS — R195 Other fecal abnormalities: Secondary | ICD-10-CM | POA: Diagnosis present

## 2020-03-05 DIAGNOSIS — Z87891 Personal history of nicotine dependence: Secondary | ICD-10-CM | POA: Insufficient documentation

## 2020-03-05 DIAGNOSIS — Z7982 Long term (current) use of aspirin: Secondary | ICD-10-CM | POA: Diagnosis not present

## 2020-03-05 DIAGNOSIS — Z79899 Other long term (current) drug therapy: Secondary | ICD-10-CM | POA: Insufficient documentation

## 2020-03-05 DIAGNOSIS — Z885 Allergy status to narcotic agent status: Secondary | ICD-10-CM | POA: Insufficient documentation

## 2020-03-05 DIAGNOSIS — Z880 Allergy status to penicillin: Secondary | ICD-10-CM | POA: Insufficient documentation

## 2020-03-05 DIAGNOSIS — E782 Mixed hyperlipidemia: Secondary | ICD-10-CM | POA: Insufficient documentation

## 2020-03-05 DIAGNOSIS — Z888 Allergy status to other drugs, medicaments and biological substances status: Secondary | ICD-10-CM | POA: Insufficient documentation

## 2020-03-05 DIAGNOSIS — K922 Gastrointestinal hemorrhage, unspecified: Secondary | ICD-10-CM | POA: Insufficient documentation

## 2020-03-05 LAB — CBC WITH DIFFERENTIAL/PLATELET
Abs Immature Granulocytes: 0.01 10*3/uL (ref 0.00–0.07)
Basophils Absolute: 0 10*3/uL (ref 0.0–0.1)
Basophils Relative: 1 %
Eosinophils Absolute: 0.1 10*3/uL (ref 0.0–0.5)
Eosinophils Relative: 2 %
HCT: 32.4 % — ABNORMAL LOW (ref 36.0–46.0)
Hemoglobin: 10 g/dL — ABNORMAL LOW (ref 12.0–15.0)
Immature Granulocytes: 0 %
Lymphocytes Relative: 28 %
Lymphs Abs: 1 10*3/uL (ref 0.7–4.0)
MCH: 26.9 pg (ref 26.0–34.0)
MCHC: 30.9 g/dL (ref 30.0–36.0)
MCV: 87.1 fL (ref 80.0–100.0)
Monocytes Absolute: 0.3 10*3/uL (ref 0.1–1.0)
Monocytes Relative: 9 %
Neutro Abs: 2.2 10*3/uL (ref 1.7–7.7)
Neutrophils Relative %: 60 %
Platelets: 485 10*3/uL — ABNORMAL HIGH (ref 150–400)
RBC: 3.72 MIL/uL — ABNORMAL LOW (ref 3.87–5.11)
RDW: 16.6 % — ABNORMAL HIGH (ref 11.5–15.5)
WBC: 3.6 10*3/uL — ABNORMAL LOW (ref 4.0–10.5)
nRBC: 0 % (ref 0.0–0.2)

## 2020-03-05 LAB — BASIC METABOLIC PANEL
Anion gap: 8 (ref 5–15)
BUN: 22 mg/dL (ref 8–23)
CO2: 25 mmol/L (ref 22–32)
Calcium: 9.6 mg/dL (ref 8.9–10.3)
Chloride: 105 mmol/L (ref 98–111)
Creatinine, Ser: 0.94 mg/dL (ref 0.44–1.00)
GFR calc Af Amer: >60 mL/min (ref >60)
GFR calc non Af Amer: >60 mL/min (ref >60)
Glucose, Bld: 106 mg/dL — ABNORMAL HIGH (ref 70–99)
Potassium: 4.9 mmol/L (ref 3.5–5.1)
Sodium: 138 mmol/L (ref 135–145)

## 2020-03-05 LAB — OCCULT BLOOD X 1 CARD TO LAB, STOOL: Fecal Occult Bld: POSITIVE — AB

## 2020-03-05 NOTE — ED Notes (Signed)
Chaperone for EDP Karle Starch for rectal exam, hemooccult specimen

## 2020-03-05 NOTE — ED Triage Notes (Signed)
Dark stool since Friday. Takes blood thinners.

## 2020-03-05 NOTE — ED Notes (Signed)
ED Provider at bedside. 

## 2020-03-05 NOTE — ED Provider Notes (Signed)
[place] EMERGENCY DEPARTMENT Provider Note  CSN: [id number] Arrival date & time: 03/05/20 1054    History Chief Complaint  Patient presents with  . Blood In Stools    HPI  Nancy Tran is a 64 y.o. female with recent history of L ICA stent placed by Neurosurgery at [facility] for aneurysm now taking ASA/Brilinta with subsequent GI bleeding. She was admitted in March for same, wound to have and erosive mass in ascending colon on colonoscopy but biopsies were neg for malignancy, felt to be an inflammatory ulcerative process. She was doing well since then and recent visit to PCP showed hemoglobin had rebounded to ~10. She has not had any pain, but noticed some dark stools since Friday. She had actually been seen in the GI office that day and was planned for repeat colonoscopy in the near future for re-eval of the erosive mass. She denies any abdominal pain, no vomiting blood. No recent pepto-bismol or iron supplements but she has been eating more greens and liver recently to increase her iron intake.    Past Medical History:  Diagnosis Date  . Adenoma of colon 2007   Adenomatous polyps in 2007, 2012, 2017.  GI MD is [facility] in [place].    . Complication of anesthesia   . Diverticulitis 12/2019  . Gastritis, erosive 08/2018   EGD by Dr Mitchell Heir.    . High cholesterol   . History of cerebral aneurysm    bil internal carotid artery aneurysms.   embollization and stent to left 12/29/19 at [facility].    . Hypertension   . Normocytic anemia 2016  . PONV (postoperative nausea and vomiting)   . Tubal ligation status     Past Surgical History:  Procedure Laterality Date  . BIOPSY  01/27/2020   Procedure: BIOPSY;  Surgeon: Gatha Mayer, MD;  Location: [facility] ENDOSCOPY;  Service: Endoscopy;;  . BREAST SURGERY    . CEREBRAL ANEURYSM REPAIR  12/29/2019  . COLONOSCOPY    . COLONOSCOPY WITH PROPOFOL N/A 01/27/2020   Procedure: COLONOSCOPY WITH PROPOFOL;  Surgeon: Gatha Mayer, MD;  Location: [facility]OSCOPY;  Service: Endoscopy;  Laterality: N/A;  . IR ANGIOGRAM SELECTIVE EACH ADDITIONAL VESSEL  01/25/2020  . IR ANGIOGRAM SELECTIVE EACH ADDITIONAL VESSEL  01/25/2020  . IR ANGIOGRAM VISCERAL SELECTIVE  01/25/2020  . IR ANGIOGRAM VISCERAL SELECTIVE  01/25/2020  . IR US GUIDE VASC ACCESS RIGHT  01/25/2020  . TUBAL LIGATION      No family history on file.  Social History   Tobacco Use  . Smoking status: Former Research scientist (life sciences)  . Smokeless tobacco: Never Used  . Tobacco comment: QUIT IN 1992  Substance Use Topics  . Alcohol use: Yes  . Drug use: No     Home Medications Prior to Admission medications   Medication Sig Start Date End Date Taking? Authorizing Provider  aspirin 81 MG EC tablet Take 81 mg by mouth daily. 12/22/19   [provider]  baclofen (LIORESAL) 10 MG tablet Take 5-10 mg by mouth every 8 (eight) hours as needed for muscle spasms. 11/10/19   [provider]  BRILINTA 90 MG TABS tablet Take 90 mg by mouth 2 (two) times daily. 12/10/19   [provider]  loratadine (CLARITIN) 10 MG tablet Take 1 tablet (10 mg total) by mouth daily. Patient taking differently: Take 10 mg by mouth daily as needed for allergies.  01/19/20   [name], MD  Multiple Vitamin (  MULTIVITAMIN WITH MINERALS) TABS tablet Take 1 tablet by mouth daily.    [provider]  omeprazole (PRILOSEC) 40 MG capsule Take 40 mg by mouth daily. 12/20/19   [provider]  pravastatin (PRAVACHOL) 40 MG tablet Take 40 mg by mouth daily. 12/25/19   [provider]     Allergies    Codeine, Darvocet [propoxyphene n-acetaminophen], Lisinopril, Morphine and related, Percocet [oxycodone-acetaminophen], Amoxicillin-pot clavulanate, and Penicillins   Review of Systems   Review of Systems  Constitutional: Negative for fever.  HENT: Negative for congestion and sore throat.   Respiratory: Negative for cough and shortness of breath.     Cardiovascular: Negative for chest pain.  Gastrointestinal: Positive for blood in stool. Negative for abdominal pain, diarrhea, nausea and vomiting.  Genitourinary: Negative for dysuria.  Musculoskeletal: Negative for myalgias.  Skin: Negative for rash.  Neurological: Negative for headaches.  Psychiatric/Behavioral: Negative for behavioral problems.     Physical Exam BP (!) 157/69 (BP Location: Right Arm)   Pulse 73   Temp 98.1 F (36.7 C) (Oral)   Resp 16   Ht 5\' 7"  (1.702 m)   Wt 83 kg   SpO2 100%   BMI 28.66 kg/m   Physical Exam Vitals and nursing note reviewed.  Constitutional:      Appearance: Normal appearance.  HENT:     Head: Normocephalic and atraumatic.     Nose: Nose normal.     Mouth/Throat:     Mouth: Mucous membranes are moist.  Eyes:     Extraocular Movements: Extraocular movements intact.     Conjunctiva/sclera: Conjunctivae normal.  Cardiovascular:     Rate and Rhythm: Normal rate.  Pulmonary:     Effort: Pulmonary effort is normal.     Breath sounds: Normal breath sounds.  Abdominal:     General: Abdomen is flat.     Palpations: Abdomen is soft.     Tenderness: There is no abdominal tenderness.  Genitourinary:    Rectum: Guaiac result positive.     Comments: Black stool on rectal exam, no masses or tenderness.  Musculoskeletal:        General: No swelling. Normal range of motion.     Cervical back: Neck supple.  Skin:    General: Skin is warm and dry.  Neurological:     General: No focal deficit present.     Mental Status: She is alert.  Psychiatric:        Mood and Affect: Mood normal.      ED Results / Procedures / Treatments   Labs (all labs ordered are listed, but only abnormal results are displayed) Labs Reviewed  OCCULT BLOOD X 1 CARD TO LAB, STOOL - Abnormal; Notable for the following components:      Result Value   Fecal Occult Bld POSITIVE (*)    All other components within normal limits  BASIC METABOLIC PANEL -  Abnormal; Notable for the following components:   Glucose, Bld 106 (*)    All other components within normal limits  CBC WITH DIFFERENTIAL/PLATELET - Abnormal; Notable for the following components:   WBC 3.6 (*)    RBC 3.72 (*)    Hemoglobin 10.0 (*)    HCT 32.4 (*)    RDW 16.6 (*)    Platelets 485 (*)    All other components within normal limits    EKG None   Radiology No results found.  Procedures Procedures  Medications Ordered in the ED Medications - No data to  display   ED Course  I have reviewed the triage vital signs and the nursing notes.  Pertinent labs & imaging results that were available during my care of the patient were reviewed by me and considered in my medical decision making (see chart for details).  Clinical Course as of Mar 06 1307  Sun Mar 05, 2020  1306 Spoke with [name] Comment, GI in [facility] who is familiar with the patient. He does not feel that she needs readmission given stable Hgb and otherwise asymptomatic. He would like for the patient to have her Hgb rechecked in his office in 2-3 days. Patient advised to RTED for any other concerns.    [CS]    Clinical Course User Index [CS] Truddie Hidden, MD    MDM Rules/Calculators/A&P MDM  Final Clinical Impression(s) / ED Diagnoses Final diagnoses:  Gastrointestinal hemorrhage, unspecified gastrointestinal hemorrhage type    Rx / DC Orders ED Discharge Orders    None       Truddie Hidden, MD 03/05/20 1308

## 2020-03-05 NOTE — ED Notes (Signed)
Pt discharged to home. Discharge instructions have been discussed with patient and/or family members. Pt verbally acknowledges understanding d/c instructions, and endorses comprehension to checkout at registration before leaving.  °

## 2020-03-12 ENCOUNTER — Emergency Department (HOSPITAL_BASED_OUTPATIENT_CLINIC_OR_DEPARTMENT_OTHER): Payer: BC Managed Care – PPO

## 2020-03-12 ENCOUNTER — Observation Stay (HOSPITAL_BASED_OUTPATIENT_CLINIC_OR_DEPARTMENT_OTHER)
Admission: EM | Admit: 2020-03-12 | Discharge: 2020-03-12 | Disposition: A | Discharge status: Home / Self Care | Payer: BC Managed Care – PPO | Attending: Internal Medicine | Admitting: Internal Medicine

## 2020-03-12 ENCOUNTER — Encounter (HOSPITAL_BASED_OUTPATIENT_CLINIC_OR_DEPARTMENT_OTHER): Payer: Self-pay | Admitting: Emergency Medicine

## 2020-03-12 ENCOUNTER — Other Ambulatory Visit: Payer: Self-pay

## 2020-03-12 DIAGNOSIS — Z20822 Contact with and (suspected) exposure to covid-19: Secondary | ICD-10-CM | POA: Diagnosis not present

## 2020-03-12 DIAGNOSIS — K219 Gastro-esophageal reflux disease without esophagitis: Secondary | ICD-10-CM | POA: Diagnosis not present

## 2020-03-12 DIAGNOSIS — Z79899 Other long term (current) drug therapy: Secondary | ICD-10-CM | POA: Diagnosis not present

## 2020-03-12 DIAGNOSIS — R42 Dizziness and giddiness: Principal | ICD-10-CM

## 2020-03-12 DIAGNOSIS — F411 Generalized anxiety disorder: Secondary | ICD-10-CM | POA: Insufficient documentation

## 2020-03-12 DIAGNOSIS — M1990 Unspecified osteoarthritis, unspecified site: Secondary | ICD-10-CM | POA: Diagnosis not present

## 2020-03-12 DIAGNOSIS — M47816 Spondylosis without myelopathy or radiculopathy, lumbar region: Secondary | ICD-10-CM | POA: Insufficient documentation

## 2020-03-12 DIAGNOSIS — E7800 Pure hypercholesterolemia, unspecified: Secondary | ICD-10-CM | POA: Insufficient documentation

## 2020-03-12 DIAGNOSIS — Z7982 Long term (current) use of aspirin: Secondary | ICD-10-CM | POA: Insufficient documentation

## 2020-03-12 DIAGNOSIS — Z87891 Personal history of nicotine dependence: Secondary | ICD-10-CM | POA: Diagnosis not present

## 2020-03-12 DIAGNOSIS — Z86010 Personal history of colonic polyps: Secondary | ICD-10-CM | POA: Diagnosis not present

## 2020-03-12 DIAGNOSIS — I700 Atherosclerosis of aorta: Secondary | ICD-10-CM | POA: Diagnosis not present

## 2020-03-12 DIAGNOSIS — I10 Essential (primary) hypertension: Secondary | ICD-10-CM | POA: Insufficient documentation

## 2020-03-12 DIAGNOSIS — Z8719 Personal history of other diseases of the digestive system: Secondary | ICD-10-CM

## 2020-03-12 DIAGNOSIS — E785 Hyperlipidemia, unspecified: Secondary | ICD-10-CM | POA: Diagnosis not present

## 2020-03-12 LAB — CBC WITH DIFFERENTIAL/PLATELET
Abs Immature Granulocytes: 0.02 10*3/uL (ref 0.00–0.07)
Basophils Absolute: 0 10*3/uL (ref 0.0–0.1)
Basophils Relative: 1 %
Eosinophils Absolute: 0 10*3/uL (ref 0.0–0.5)
Eosinophils Relative: 1 %
HCT: 29 % — ABNORMAL LOW (ref 36.0–46.0)
Hemoglobin: 9.2 g/dL — ABNORMAL LOW (ref 12.0–15.0)
Immature Granulocytes: 1 %
Lymphocytes Relative: 25 %
Lymphs Abs: 0.9 10*3/uL (ref 0.7–4.0)
MCH: 26.7 pg (ref 26.0–34.0)
MCHC: 31.7 g/dL (ref 30.0–36.0)
MCV: 84.3 fL (ref 80.0–100.0)
Monocytes Absolute: 0.3 10*3/uL (ref 0.1–1.0)
Monocytes Relative: 9 %
Neutro Abs: 2.3 10*3/uL (ref 1.7–7.7)
Neutrophils Relative %: 63 %
Platelets: 288 10*3/uL (ref 150–400)
RBC: 3.44 MIL/uL — ABNORMAL LOW (ref 3.87–5.11)
RDW: 15.9 % — ABNORMAL HIGH (ref 11.5–15.5)
WBC: 3.6 10*3/uL — ABNORMAL LOW (ref 4.0–10.5)
nRBC: 0 % (ref 0.0–0.2)

## 2020-03-12 LAB — URINALYSIS, ROUTINE W REFLEX MICROSCOPIC
Bilirubin Urine: NEGATIVE
Glucose, UA: NEGATIVE mg/dL
Hgb urine dipstick: NEGATIVE
Ketones, ur: NEGATIVE mg/dL
Leukocytes,Ua: NEGATIVE
Nitrite: NEGATIVE
Protein, ur: NEGATIVE mg/dL
Specific Gravity, Urine: <1.005 — ABNORMAL LOW (ref 1.005–1.030)
pH: 7 (ref 5.0–8.0)

## 2020-03-12 LAB — COMPREHENSIVE METABOLIC PANEL
ALT: 18 U/L (ref 0–44)
AST: 23 U/L (ref 15–41)
Albumin: 4.1 g/dL (ref 3.5–5.0)
Alkaline Phosphatase: 61 U/L (ref 38–126)
Anion gap: 14 (ref 5–15)
BUN: 12 mg/dL (ref 8–23)
CO2: 21 mmol/L — ABNORMAL LOW (ref 22–32)
Calcium: 9.1 mg/dL (ref 8.9–10.3)
Chloride: 104 mmol/L (ref 98–111)
Creatinine, Ser: 0.79 mg/dL (ref 0.44–1.00)
GFR calc Af Amer: >60 mL/min (ref >60)
GFR calc non Af Amer: >60 mL/min (ref >60)
Glucose, Bld: 83 mg/dL (ref 70–99)
Potassium: 4 mmol/L (ref 3.5–5.1)
Sodium: 139 mmol/L (ref 135–145)
Total Bilirubin: 0.4 mg/dL (ref 0.3–1.2)
Total Protein: 7.3 g/dL (ref 6.5–8.1)

## 2020-03-12 LAB — TROPONIN I (HIGH SENSITIVITY)
Troponin I (High Sensitivity): 4 ng/L (ref <18)
Troponin I (High Sensitivity): 4 ng/L (ref <18)

## 2020-03-12 LAB — SARS CORONAVIRUS 2 BY RT PCR (HOSPITAL ORDER, PERFORMED IN ~~LOC~~ HOSPITAL LAB): SARS Coronavirus 2: NEGATIVE

## 2020-03-12 MED ORDER — IOHEXOL 300 MG/ML  SOLN
100.0000 mL | Freq: Once | INTRAMUSCULAR | Status: AC | PRN
  Administered 2020-03-12: 100 mL via INTRAVENOUS

## 2020-03-12 MED ORDER — MECLIZINE HCL 25 MG PO TABS
25.0000 mg | ORAL_TABLET | Freq: Three times a day (TID) | ORAL | 0 refills | Status: DC | PRN
Start: 2020-03-12 — End: 2020-12-21

## 2020-03-12 MED ORDER — MECLIZINE HCL 25 MG PO TABS
25.0000 mg | ORAL_TABLET | Freq: Three times a day (TID) | ORAL | 0 refills | Status: DC | PRN
Start: 2020-03-12 — End: 2020-03-12

## 2020-03-12 MED ORDER — SODIUM CHLORIDE 0.9 % IV BOLUS
1000.0000 mL | Freq: Once | INTRAVENOUS | Status: AC
  Administered 2020-03-12: 1000 mL via INTRAVENOUS

## 2020-03-12 MED ORDER — MECLIZINE HCL 25 MG PO TABS
25.0000 mg | ORAL_TABLET | Freq: Once | ORAL | Status: AC
  Administered 2020-03-12: 25 mg via ORAL
  Filled 2020-03-12: qty 1

## 2020-03-12 NOTE — ED Triage Notes (Signed)
Pt reports generalized weakness, dizziness, shakiness since yesterday.

## 2020-03-12 NOTE — ED Notes (Signed)
Pt on monitor 

## 2020-03-12 NOTE — ED Provider Notes (Addendum)
[facility] EMERGENCY DEPARTMENT Provider Note   CSN: [id number] Arrival date & time: 03/12/20  [id number]     History Chief Complaint  Patient presents with  . Dizziness    Nancy Tran is a 64 y.o. female.  Patient is a 64 year old female with history of recent admission for GI bleeding.  She underwent colonoscopy and was found to have a colon mass which has been determined to be noncancerous.  She has also had cerebral aneurysms in the past treated with embolization and stenting.  She presents today for evaluation of an episode that occurred yesterday afternoon.  Patient states that she was at home by herself when she experienced what she describes as her "stomach locking up" followed by feeling extremely weak, dizzy, and became near syncopal.  This lasted for approximately 15 minutes until she called her husband.  He arrived approximately 5 minutes later to check on her.  By that time her symptoms have begun to improve.  She continued to feel shaky, dizzy, and weak throughout the evening and into this morning and presents for evaluation of this.  She denies to me she experienced any specific complaints such as headache, palpitations, chest pain.  Patient has a GI doctor in [facility] whom she has followed with in the past.  The history is provided by the patient.  Dizziness Quality:  Lightheadedness Severity:  Moderate Timing:  Constant Progression:  Worsening Chronicity:  New Relieved by:  Nothing Worsened by:  Nothing Ineffective treatments:  None tried      Past Medical History:  Diagnosis Date  . Adenoma of colon 2007   Adenomatous polyps in 2007, 2012, 2017.  GI MD is [facility] in [place].    . Complication of anesthesia   . Diverticulitis 12/2019  . Gastritis, erosive 08/2018   EGD by Dr Mitchell Heir.    . High cholesterol   . History of cerebral aneurysm    bil internal carotid artery aneurysms.   embollization and stent to left 12/29/19 at [facility].      . Hypertension   . Normocytic anemia 2016  . PONV (postoperative nausea and vomiting)   . Tubal ligation status     Patient Active Problem List   Diagnosis Date Noted  . Acute lower GI bleeding 01/24/2020  . Syncope 01/24/2020  . GERD (gastroesophageal reflux disease) 01/24/2020  . Diverticulosis 01/24/2020  . Hypokalemia 01/10/2020  . Occult GI bleeding 01/10/2020  . AKI (acute kidney injury) ([place]) 01/10/2020  . Hypomagnesemia 01/10/2020  . Acute diverticulitis 01/09/2020  . Cerebral aneurysm 12/09/2019  . Family history of brain aneurysm 11/07/2019  . GAD (generalized anxiety disorder) 10/28/2019  . Tension-type headache, not intractable 10/28/2019  . Mild renal insufficiency 10/14/2019  . Leg swelling 07/20/2019  . Normochromic anemia 04/12/2019  . Gastritis determined by endoscopy 11/02/2018  . Cervical cancer screening 10/12/2018  . Neutropenia ([place]) 07/28/2018  . History of colonic polyps 06/24/2018  . Odynophagia 06/10/2018  . Thyroid nodule 06/10/2018  . Prediabetes 05/26/2015  . Osteoarthritis of knee 11/07/2014  . Essential hypertension 09/03/2014  . Hyperlipidemia LDL goal <130 10/26/2013  . Hidradenitis 06/10/2012  . Tubal ligation status     Past Surgical History:  Procedure Laterality Date  . BIOPSY  01/27/2020   Procedure: BIOPSY;  Surgeon: Gatha Mayer, MD;  [facility];  Service: Endoscopy;;  . BREAST SURGERY    . CEREBRAL ANEURYSM REPAIR  12/29/2019  . COLONOSCOPY    . COLONOSCOPY  WITH PROPOFOL N/A 01/27/2020   Procedure: COLONOSCOPY WITH PROPOFOL;  Surgeon: Gatha Mayer, MD;  [facility];  Service: Endoscopy;  Laterality: N/A;  . IR ANGIOGRAM SELECTIVE EACH ADDITIONAL VESSEL  01/25/2020  . IR ANGIOGRAM SELECTIVE EACH ADDITIONAL VESSEL  01/25/2020  . IR ANGIOGRAM VISCERAL SELECTIVE  01/25/2020  . IR ANGIOGRAM VISCERAL SELECTIVE  01/25/2020  . IR US GUIDE VASC ACCESS RIGHT  01/25/2020  . TUBAL LIGATION       OB History   No  obstetric history on file.     No family history on file.  Social History   Tobacco Use  . Smoking status: Former Research scientist (life sciences)  . Smokeless tobacco: Never Used  . Tobacco comment: QUIT IN 1992  Substance Use Topics  . Alcohol use: Yes  . Drug use: No    Home Medications Prior to Admission medications   Medication Sig Start Date End Date Taking? Authorizing Provider  aspirin 81 MG EC tablet Take 81 mg by mouth daily. 12/22/19   [provider]  baclofen (LIORESAL) 10 MG tablet Take 5-10 mg by mouth every 8 (eight) hours as needed for muscle spasms. 11/10/19   [provider]  BRILINTA 90 MG TABS tablet Take 90 mg by mouth 2 (two) times daily. 12/10/19   [provider]  loratadine (CLARITIN) 10 MG tablet Take 1 tablet (10 mg total) by mouth daily. Patient taking differently: Take 10 mg by mouth daily as needed for allergies.  01/19/20   [name], MD  Multiple Vitamin (MULTIVITAMIN WITH MINERALS) TABS tablet Take 1 tablet by mouth daily.    [provider]  omeprazole (PRILOSEC) 40 MG capsule Take 40 mg by mouth daily. 12/20/19   [provider]  pravastatin (PRAVACHOL) 40 MG tablet Take 40 mg by mouth daily. 12/25/19   [provider]    Allergies    Codeine, Darvocet [propoxyphene n-acetaminophen], Lisinopril, Morphine and related, Percocet [oxycodone-acetaminophen], Amoxicillin-pot clavulanate, and Penicillins  Review of Systems   Review of Systems  Neurological: Positive for dizziness.  All other systems reviewed and are negative.   Physical Exam Updated Vital Signs BP 136/66 (BP Location: Right Arm)   Pulse 70   Temp 99 F (37.2 C) (Oral)   Resp 16   Ht 5\' 7"  (1.702 m)   Wt 83.5 kg   SpO2 99%   BMI 28.82 kg/m   Physical Exam Vitals and nursing note reviewed.  Constitutional:      General: She is not in acute distress.    Appearance: She is well-developed. She is not diaphoretic.  HENT:     Head:  Normocephalic and atraumatic.  Eyes:     Extraocular Movements: Extraocular movements intact.     Pupils: Pupils are equal, round, and reactive to light.  Cardiovascular:     Rate and Rhythm: Normal rate and regular rhythm.     Heart sounds: No murmur. No friction rub. No gallop.   Pulmonary:     Effort: Pulmonary effort is normal. No respiratory distress.     Breath sounds: Normal breath sounds. No wheezing.  Abdominal:     General: Bowel sounds are normal. There is no distension.     Palpations: Abdomen is soft.     Tenderness: There is no abdominal tenderness.  Musculoskeletal:        General: No swelling or tenderness. Normal range of motion.     Cervical back: Normal range of motion and neck supple.  Skin:  General: Skin is warm and dry.  Neurological:     General: No focal deficit present.     Mental Status: She is alert and oriented to person, place, and time.     Cranial Nerves: No cranial nerve deficit.     Sensory: No sensory deficit.     Motor: No weakness.     Coordination: Coordination normal.     ED Results / Procedures / Treatments   Labs (all labs ordered are listed, but only abnormal results are displayed) Labs Reviewed  COMPREHENSIVE METABOLIC PANEL  CBC WITH DIFFERENTIAL/PLATELET  URINALYSIS, ROUTINE W REFLEX MICROSCOPIC  TROPONIN I (HIGH SENSITIVITY)    EKG EKG Interpretation  Date/Time:  Sunday Mar 12 2020 09:35:21 EDT Ventricular Rate:  69 PR Interval:    QRS Duration: 90 QT Interval:  403 QTC Calculation: 432 R Axis:   36 Text Interpretation: Sinus rhythm Normal ECG Confirmed by Veryl Speak [id number] on 03/12/2020 10:20:56 AM   Radiology No results found.  Procedures Procedures (including critical care time)  Medications Ordered in ED Medications  sodium chloride 0.9 % bolus 1,000 mL (has no administration in time range)    ED Course  I have reviewed the triage vital signs and the nursing notes.  Pertinent labs & imaging  results that were available during my care of the patient were reviewed by me and considered in my medical decision making (see chart for details).    MDM Rules/Calculators/A&P  Patient is a 64 year old female with past medical history as described in the HPI.  She presents today after an episode that occurred yesterday, the events of which are described in the HPI.  She feels somewhat better today, however not completely back to normal.  She arrives here with stable vital signs which remained stable throughout her emergency department course.  Work-up was obtained including laboratory studies, EKG, and CT scan of the chest and head.  These all returned essentially unremarkable.  She did have a hemoglobin of 9.2, down from 10 on our last results.  My initial impression was that since she was not having any additional melena or bloody stools, she could follow-up with her GI doctor and undergo colonoscopy as previously scheduled.  However, when she attempted to ambulate, she described dizziness.  Her blood pressure while standing and feeling dizzy was 150/80 and she does not appear orthostatic.  The cause of her dizziness is unclear at this time.  As she continues with ongoing dizziness and history of GI bleed with decreasing hemoglobin, I feel as though admission for trending of hemoglobin and further observation is warranted.  I have spoken with the hospitalist who agrees to admit.  Final Clinical Impression(s) / ED Diagnoses Final diagnoses:  None    Rx / DC Orders ED Discharge Orders    None       Veryl Speak, MD 03/12/20 1332    Veryl Speak, MD 03/12/20 1432

## 2020-03-12 NOTE — ED Provider Notes (Signed)
Update note  In brief 64 year old lady who came to ER with concern for dizziness.  Dr. Stark Jock saw and evaluated patient, labs, EKG, CT head, CT abdomen pelvis were ordered to further evaluate.  Work-up notable for slight drop in hemoglobin but otherwise negative.  Dr. Stark Jock nitially had planned to discharge with close outpatient follow-up.  However patient had recurrent episode of dizziness and requested admission.  Hospitalist accepted.  While awaiting bed placement, transport, patient states her symptoms had significantly improved and she now desires to go home.  Personally reassessed patient, she is well-appearing, ambulating in room without difficulty, she has normal vital signs.  Given her current clinical appearance and a forementioned work-up, believe patient can be discharged home and managed in the outpatient setting.  Suspect most likely her symptoms were BPPV, reports prior history of vertigo.  Will give Rx for meclizine and stressed need for PCP follow-up.   Lucrezia Starch, MD 03/12/20 1734

## 2020-03-12 NOTE — Discharge Instructions (Addendum)
Recommend starting meclizine as needed for dizziness episodes.  Continue medications as previously prescribed.  Follow-up with your primary doctor in the next week, and return to the emergency department if you develop severe chest or abdominal pain, difficulty breathing, passing out spells, black or bloody stools, or other new and concerning symptoms.

## 2020-12-15 ENCOUNTER — Emergency Department (HOSPITAL_BASED_OUTPATIENT_CLINIC_OR_DEPARTMENT_OTHER)
Admission: EM | Admit: 2020-12-15 | Discharge: 2020-12-15 | Disposition: A | Discharge status: Home / Self Care | Payer: BC Managed Care – PPO | Attending: Emergency Medicine | Admitting: Emergency Medicine

## 2020-12-15 ENCOUNTER — Other Ambulatory Visit: Payer: Self-pay

## 2020-12-15 ENCOUNTER — Encounter (HOSPITAL_BASED_OUTPATIENT_CLINIC_OR_DEPARTMENT_OTHER): Payer: Self-pay | Admitting: Emergency Medicine

## 2020-12-15 DIAGNOSIS — Z87891 Personal history of nicotine dependence: Secondary | ICD-10-CM | POA: Diagnosis not present

## 2020-12-15 DIAGNOSIS — E860 Dehydration: Secondary | ICD-10-CM

## 2020-12-15 DIAGNOSIS — Z7982 Long term (current) use of aspirin: Secondary | ICD-10-CM | POA: Diagnosis not present

## 2020-12-15 DIAGNOSIS — R252 Cramp and spasm: Secondary | ICD-10-CM | POA: Diagnosis present

## 2020-12-15 DIAGNOSIS — Z79899 Other long term (current) drug therapy: Secondary | ICD-10-CM | POA: Diagnosis not present

## 2020-12-15 DIAGNOSIS — I10 Essential (primary) hypertension: Secondary | ICD-10-CM | POA: Diagnosis not present

## 2020-12-15 LAB — CBC WITH DIFFERENTIAL/PLATELET
Abs Immature Granulocytes: 0.02 10*3/uL (ref 0.00–0.07)
Basophils Absolute: 0 10*3/uL (ref 0.0–0.1)
Basophils Relative: 1 %
Eosinophils Absolute: 0.1 10*3/uL (ref 0.0–0.5)
Eosinophils Relative: 1 %
HCT: 31.4 % — ABNORMAL LOW (ref 36.0–46.0)
Hemoglobin: 10.1 g/dL — ABNORMAL LOW (ref 12.0–15.0)
Immature Granulocytes: 0 %
Lymphocytes Relative: 23 %
Lymphs Abs: 1.1 10*3/uL (ref 0.7–4.0)
MCH: 25.4 pg — ABNORMAL LOW (ref 26.0–34.0)
MCHC: 32.2 g/dL (ref 30.0–36.0)
MCV: 79.1 fL — ABNORMAL LOW (ref 80.0–100.0)
Monocytes Absolute: 0.6 10*3/uL (ref 0.1–1.0)
Monocytes Relative: 14 %
Neutro Abs: 2.8 10*3/uL (ref 1.7–7.7)
Neutrophils Relative %: 61 %
Platelets: 315 10*3/uL (ref 150–400)
RBC: 3.97 MIL/uL (ref 3.87–5.11)
RDW: 17.3 % — ABNORMAL HIGH (ref 11.5–15.5)
WBC: 4.7 10*3/uL (ref 4.0–10.5)
nRBC: 0 % (ref 0.0–0.2)

## 2020-12-15 LAB — URINALYSIS, ROUTINE W REFLEX MICROSCOPIC
Bilirubin Urine: NEGATIVE
Glucose, UA: NEGATIVE mg/dL
Hgb urine dipstick: NEGATIVE
Ketones, ur: NEGATIVE mg/dL
Leukocytes,Ua: NEGATIVE
Nitrite: NEGATIVE
Protein, ur: NEGATIVE mg/dL
Specific Gravity, Urine: 1.005 (ref 1.005–1.030)
pH: 6 (ref 5.0–8.0)

## 2020-12-15 LAB — BASIC METABOLIC PANEL
Anion gap: 9 (ref 5–15)
BUN: 37 mg/dL — ABNORMAL HIGH (ref 8–23)
CO2: 23 mmol/L (ref 22–32)
Calcium: 9.3 mg/dL (ref 8.9–10.3)
Chloride: 96 mmol/L — ABNORMAL LOW (ref 98–111)
Creatinine, Ser: 1.88 mg/dL — ABNORMAL HIGH (ref 0.44–1.00)
GFR, Estimated: 29 mL/min — ABNORMAL LOW (ref >60)
Glucose, Bld: 102 mg/dL — ABNORMAL HIGH (ref 70–99)
Potassium: 4.8 mmol/L (ref 3.5–5.1)
Sodium: 128 mmol/L — ABNORMAL LOW (ref 135–145)

## 2020-12-15 MED ORDER — KETOROLAC TROMETHAMINE 30 MG/ML IJ SOLN
30.0000 mg | Freq: Once | INTRAMUSCULAR | Status: AC
  Administered 2020-12-15: 30 mg via INTRAVENOUS
  Filled 2020-12-15: qty 1

## 2020-12-15 MED ORDER — LORAZEPAM 2 MG/ML IJ SOLN
1.0000 mg | Freq: Once | INTRAMUSCULAR | Status: AC
  Administered 2020-12-15: 1 mg via INTRAVENOUS
  Filled 2020-12-15: qty 1

## 2020-12-15 MED ORDER — SODIUM CHLORIDE 0.9 % IV BOLUS
1000.0000 mL | Freq: Once | INTRAVENOUS | Status: AC
  Administered 2020-12-15: 1000 mL via INTRAVENOUS

## 2020-12-15 NOTE — ED Triage Notes (Signed)
Pt states been having cramps all over at different times since Wednesday. Having hard time working or sleeping  Due to them.

## 2020-12-15 NOTE — ED Provider Notes (Signed)
[facility] EMERGENCY DEPARTMENT Provider Note   CSN: [id number] Arrival date & time: 12/15/20  0107     History No chief complaint on file.   Nancy Tran is a 65 y.o. female.  Patient is a 65 year old female with history of GERD, anemia, anxiety.  She presents today for evaluation of cramping.  She describes a several day history of muscle spasms that seems to migrate throughout her body.  This cramping has involved her legs, feet, and toes, as well as her ribs and left upper back.  This feeling comes and goes in waves.  She was recently started on antibiotics for a UTI and also had her cholesterol pill increased, but the cramping seemed to start before these medication changes.  No diarrhea, fever, or chills.    The history is provided by the patient.       Past Medical History:  Diagnosis Date  . Adenoma of colon 2007   Adenomatous polyps in 2007, 2012, 2017.  GI MD is [facility] in [place].    . Complication of anesthesia   . Diverticulitis 12/2019  . Gastritis, erosive 08/2018   EGD by Dr Mitchell Heir.    . High cholesterol   . History of cerebral aneurysm    bil internal carotid artery aneurysms.   embollization and stent to left 12/29/19 at [facility].    . Hypertension   . Normocytic anemia 2016  . PONV (postoperative nausea and vomiting)   . Tubal ligation status     Patient Active Problem List   Diagnosis Date Noted  . Dizziness 03/12/2020  . Acute lower GI bleeding 01/24/2020  . Syncope 01/24/2020  . GERD (gastroesophageal reflux disease) 01/24/2020  . Diverticulosis 01/24/2020  . Hypokalemia 01/10/2020  . Occult GI bleeding 01/10/2020  . AKI (acute kidney injury) ([place]) 01/10/2020  . Hypomagnesemia 01/10/2020  . Acute diverticulitis 01/09/2020  . Cerebral aneurysm 12/09/2019  . Family history of brain aneurysm 11/07/2019  . GAD (generalized anxiety disorder) 10/28/2019  . Tension-type headache, not intractable 10/28/2019  . Mild renal  insufficiency 10/14/2019  . Leg swelling 07/20/2019  . Normochromic anemia 04/12/2019  . Gastritis determined by endoscopy 11/02/2018  . Cervical cancer screening 10/12/2018  . Neutropenia ([place]) 07/28/2018  . History of colonic polyps 06/24/2018  . Odynophagia 06/10/2018  . Thyroid nodule 06/10/2018  . Prediabetes 05/26/2015  . Osteoarthritis of knee 11/07/2014  . Essential hypertension 09/03/2014  . Hyperlipidemia LDL goal <130 10/26/2013  . Hidradenitis 06/10/2012  . Tubal ligation status     Past Surgical History:  Procedure Laterality Date  . BIOPSY  01/27/2020   Procedure: BIOPSY;  Surgeon: Gatha Mayer, MD;  Location: [facility] ENDOSCOPY;  Service: Endoscopy;;  . BREAST SURGERY    . CEREBRAL ANEURYSM REPAIR  12/29/2019  . COLONOSCOPY    . COLONOSCOPY WITH PROPOFOL N/A 01/27/2020   Procedure: COLONOSCOPY WITH PROPOFOL;  Surgeon: Gatha Mayer, MD;  Location: [facility] ENDOSCOPY;  Service: Endoscopy;  Laterality: N/A;  . IR ANGIOGRAM SELECTIVE EACH ADDITIONAL VESSEL  01/25/2020  . IR ANGIOGRAM SELECTIVE EACH ADDITIONAL VESSEL  01/25/2020  . IR ANGIOGRAM VISCERAL SELECTIVE  01/25/2020  . IR ANGIOGRAM VISCERAL SELECTIVE  01/25/2020  . IR US GUIDE VASC ACCESS RIGHT  01/25/2020  . TUBAL LIGATION       OB History   No obstetric history on file.     No family history on file.  Social History   Tobacco Use  . Smoking status: Former  Smoker  . Smokeless tobacco: Never Used  . Tobacco comment: QUIT IN 1992  Vaping Use  . Vaping Use: Never used  Substance Use Topics  . Alcohol use: Not Currently  . Drug use: No    Home Medications Prior to Admission medications   Medication Sig Start Date End Date Taking? Authorizing Provider  aspirin 81 MG EC tablet Take 81 mg by mouth daily. 12/22/19   [provider]  baclofen (LIORESAL) 10 MG tablet Take 5-10 mg by mouth every 8 (eight) hours as needed for muscle spasms. 11/10/19   [provider]  BRILINTA 90 MG TABS tablet Take  90 mg by mouth 2 (two) times daily. 12/10/19   [provider]  hydrochlorothiazide (MICROZIDE) 12.5 MG capsule Take 12.5 mg by mouth daily. 12/06/20   [provider]  hydrOXYzine (ATARAX/VISTARIL) 25 MG tablet TAKE 1 TABLET (25 MG TOTAL) BY MOUTH 3 (THREE) TIMES DAILY AS NEEDED FOR UP TO 7 DAYS. 12/11/20   [provider]  loratadine (CLARITIN) 10 MG tablet Take 1 tablet (10 mg total) by mouth daily. Patient taking differently: Take 10 mg by mouth daily as needed for allergies.  01/19/20   [name], MD  losartan (COZAAR) 100 MG tablet Take 100 mg by mouth daily. 11/16/20   [provider]  meclizine (ANTIVERT) 25 MG tablet Take 1 tablet (25 mg total) by mouth 3 (three) times daily as needed for dizziness or nausea. 03/12/20   Lucrezia Starch, MD  Multiple Vitamin (MULTIVITAMIN WITH MINERALS) TABS tablet Take 1 tablet by mouth daily.    [provider]  nitrofurantoin, macrocrystal-monohydrate, (MACROBID) 100 MG capsule Take 100 mg by mouth 2 (two) times daily. 12/11/20   [provider]  nystatin cream (MYCOSTATIN) APPLY TO AFFECTED AREA(S) ONE TO TWO TIMES DAILY AS NEEDED FOR UP TO 7 DAYS 12/07/20   [provider]  omeprazole (PRILOSEC) 40 MG capsule Take 40 mg by mouth daily. 12/20/19   [provider]  pravastatin (PRAVACHOL) 40 MG tablet Take 40 mg by mouth daily. 12/25/19   [provider]  sulfamethoxazole-trimethoprim (BACTRIM DS) 800-160 MG tablet Take 1 tablet by mouth 2 (two) times daily. 12/11/20   [provider]    Allergies    Codeine, Darvocet [propoxyphene n-acetaminophen], Lisinopril, Morphine and related, Percocet [oxycodone-acetaminophen], Amoxicillin-pot clavulanate, and Penicillins  Review of Systems   Review of Systems  All other systems reviewed and are negative.   Physical Exam Updated Vital Signs BP (!) 141/51 (BP Location: Right Arm)   Pulse 68   Temp 98.5 F (36.9 C)  (Oral)   Resp 20   Ht 5\' 7"  (1.702 m)   Wt 86.2 kg   SpO2 100%   BMI 29.76 kg/m   Physical Exam Vitals and nursing note reviewed.  Constitutional:      General: She is not in acute distress.    Appearance: She is well-developed and well-nourished. She is not diaphoretic.  HENT:     Head: Normocephalic and atraumatic.  Cardiovascular:     Rate and Rhythm: Normal rate and regular rhythm.     Heart sounds: No murmur heard. No friction rub. No gallop.   Pulmonary:     Effort: Pulmonary effort is normal. No respiratory distress.     Breath sounds: Normal breath sounds. No wheezing.  Abdominal:     General: Bowel sounds are normal. There is no distension.     Palpations: Abdomen is soft.  Tenderness: There is no abdominal tenderness.  Musculoskeletal:        General: No swelling or tenderness. Normal range of motion.     Cervical back: Normal range of motion and neck supple.     Right lower leg: No edema.     Left lower leg: No edema.  Skin:    General: Skin is warm and dry.  Neurological:     Mental Status: She is alert and oriented to person, place, and time.     ED Results / Procedures / Treatments   Labs (all labs ordered are listed, but only abnormal results are displayed) Labs Reviewed  BASIC METABOLIC PANEL  CBC WITH DIFFERENTIAL/PLATELET  URINALYSIS, ROUTINE W REFLEX MICROSCOPIC    EKG None  Radiology No results found.  Procedures Procedures   Medications Ordered in ED Medications  sodium chloride 0.9 % bolus 1,000 mL (has no administration in time range)  ketorolac (TORADOL) 30 MG/ML injection 30 mg (has no administration in time range)    ED Course  I have reviewed the triage vital signs and the nursing notes.  Pertinent labs & imaging results that were available during my care of the patient were reviewed by me and considered in my medical decision making (see chart for details).    MDM Rules/Calculators/A&P  Patient presenting here  complaining of cramping throughout her body for the past few days.  Her physical examination is unremarkable and vital signs are stable.  Patient's work-up does show a bump in her creatinine and elevated BUN concerning for dehydration.  Patient given normal saline here in the ER along with Toradol and Ativan and is feeling better.  At this point, I feel as though discharge is appropriate.  Patient will be advised to increase her fluid intake and follow-up with her primary doctor early next week for a recheck of her basic metabolic panel.  Final Clinical Impression(s) / ED Diagnoses Final diagnoses:  None    Rx / DC Orders ED Discharge Orders    None       Veryl Speak, MD 12/15/20 [phone]

## 2020-12-15 NOTE — Discharge Instructions (Addendum)
Drink plenty of fluids for the next several days.  Follow-up with your primary doctor next week for a recheck of your basic metabolic panel.  Return to the ER in the meantime if symptoms significantly worsen or change.

## 2020-12-17 ENCOUNTER — Encounter (HOSPITAL_BASED_OUTPATIENT_CLINIC_OR_DEPARTMENT_OTHER): Payer: Self-pay | Admitting: Emergency Medicine

## 2020-12-17 ENCOUNTER — Encounter (HOSPITAL_COMMUNITY): Payer: Self-pay | Admitting: Emergency Medicine

## 2020-12-17 ENCOUNTER — Other Ambulatory Visit: Payer: Self-pay

## 2020-12-17 ENCOUNTER — Emergency Department (HOSPITAL_COMMUNITY)
Admission: EM | Admit: 2020-12-17 | Discharge: 2020-12-17 | Disposition: A | Discharge status: Home / Self Care | Payer: BC Managed Care – PPO | Source: Home / Self Care

## 2020-12-17 ENCOUNTER — Inpatient Hospital Stay (HOSPITAL_BASED_OUTPATIENT_CLINIC_OR_DEPARTMENT_OTHER)
Admission: EM | Admit: 2020-12-17 | Discharge: 2020-12-21 | DRG: 683 | Disposition: A | Discharge status: Home / Self Care | Payer: BC Managed Care – PPO | Attending: Family Medicine | Admitting: Family Medicine

## 2020-12-17 DIAGNOSIS — Z9851 Tubal ligation status: Secondary | ICD-10-CM

## 2020-12-17 DIAGNOSIS — K219 Gastro-esophageal reflux disease without esophagitis: Secondary | ICD-10-CM | POA: Diagnosis present

## 2020-12-17 DIAGNOSIS — Z7982 Long term (current) use of aspirin: Secondary | ICD-10-CM

## 2020-12-17 DIAGNOSIS — E785 Hyperlipidemia, unspecified: Secondary | ICD-10-CM | POA: Diagnosis present

## 2020-12-17 DIAGNOSIS — Z881 Allergy status to other antibiotic agents status: Secondary | ICD-10-CM

## 2020-12-17 DIAGNOSIS — Z20822 Contact with and (suspected) exposure to covid-19: Secondary | ICD-10-CM | POA: Diagnosis present

## 2020-12-17 DIAGNOSIS — Z5321 Procedure and treatment not carried out due to patient leaving prior to being seen by health care provider: Secondary | ICD-10-CM | POA: Insufficient documentation

## 2020-12-17 DIAGNOSIS — F419 Anxiety disorder, unspecified: Secondary | ICD-10-CM | POA: Diagnosis present

## 2020-12-17 DIAGNOSIS — N179 Acute kidney failure, unspecified: Secondary | ICD-10-CM | POA: Diagnosis not present

## 2020-12-17 DIAGNOSIS — N289 Disorder of kidney and ureter, unspecified: Secondary | ICD-10-CM

## 2020-12-17 DIAGNOSIS — Z87891 Personal history of nicotine dependence: Secondary | ICD-10-CM

## 2020-12-17 DIAGNOSIS — M6282 Rhabdomyolysis: Secondary | ICD-10-CM | POA: Diagnosis present

## 2020-12-17 DIAGNOSIS — Z86010 Personal history of colonic polyps: Secondary | ICD-10-CM

## 2020-12-17 DIAGNOSIS — M791 Myalgia, unspecified site: Secondary | ICD-10-CM | POA: Insufficient documentation

## 2020-12-17 DIAGNOSIS — E7800 Pure hypercholesterolemia, unspecified: Secondary | ICD-10-CM | POA: Diagnosis present

## 2020-12-17 DIAGNOSIS — Z880 Allergy status to penicillin: Secondary | ICD-10-CM

## 2020-12-17 DIAGNOSIS — Z885 Allergy status to narcotic agent status: Secondary | ICD-10-CM

## 2020-12-17 DIAGNOSIS — D509 Iron deficiency anemia, unspecified: Secondary | ICD-10-CM | POA: Diagnosis present

## 2020-12-17 DIAGNOSIS — I10 Essential (primary) hypertension: Secondary | ICD-10-CM | POA: Diagnosis present

## 2020-12-17 DIAGNOSIS — E871 Hypo-osmolality and hyponatremia: Secondary | ICD-10-CM | POA: Diagnosis present

## 2020-12-17 DIAGNOSIS — T50905D Adverse effect of unspecified drugs, medicaments and biological substances, subsequent encounter: Secondary | ICD-10-CM

## 2020-12-17 DIAGNOSIS — Z79899 Other long term (current) drug therapy: Secondary | ICD-10-CM

## 2020-12-17 MED ORDER — METHOCARBAMOL 1000 MG/10ML IJ SOLN
1000.0000 mg | Freq: Once | INTRAMUSCULAR | Status: AC
  Administered 2020-12-18: 1000 mg via INTRAVENOUS
  Filled 2020-12-17: qty 10

## 2020-12-17 MED ORDER — SODIUM CHLORIDE 0.9 % IV BOLUS
500.0000 mL | Freq: Once | INTRAVENOUS | Status: AC
  Administered 2020-12-18: 500 mL via INTRAVENOUS

## 2020-12-17 MED ORDER — KETOROLAC TROMETHAMINE 30 MG/ML IJ SOLN
30.0000 mg | Freq: Once | INTRAMUSCULAR | Status: DC
  Filled 2020-12-17: qty 1

## 2020-12-17 NOTE — ED Triage Notes (Signed)
Continues to have body cramps all over.  Seen previously for the same.  Has not been able to follow up yet.  Taken to [facility] first.  Came here due to wait.

## 2020-12-17 NOTE — ED Triage Notes (Signed)
Pt reports she is continues to have cramps "all over my body."  Was seen at [facility] for same earlier today.

## 2020-12-18 ENCOUNTER — Observation Stay (HOSPITAL_COMMUNITY): Payer: BC Managed Care – PPO

## 2020-12-18 ENCOUNTER — Encounter (HOSPITAL_BASED_OUTPATIENT_CLINIC_OR_DEPARTMENT_OTHER): Payer: Self-pay | Admitting: Emergency Medicine

## 2020-12-18 ENCOUNTER — Emergency Department (HOSPITAL_BASED_OUTPATIENT_CLINIC_OR_DEPARTMENT_OTHER): Payer: BC Managed Care – PPO

## 2020-12-18 DIAGNOSIS — Z20822 Contact with and (suspected) exposure to covid-19: Secondary | ICD-10-CM | POA: Diagnosis not present

## 2020-12-18 DIAGNOSIS — Z86010 Personal history of colonic polyps: Secondary | ICD-10-CM | POA: Diagnosis not present

## 2020-12-18 DIAGNOSIS — D509 Iron deficiency anemia, unspecified: Secondary | ICD-10-CM | POA: Diagnosis not present

## 2020-12-18 DIAGNOSIS — Z9851 Tubal ligation status: Secondary | ICD-10-CM | POA: Diagnosis not present

## 2020-12-18 DIAGNOSIS — Z881 Allergy status to other antibiotic agents status: Secondary | ICD-10-CM | POA: Diagnosis not present

## 2020-12-18 DIAGNOSIS — Z87891 Personal history of nicotine dependence: Secondary | ICD-10-CM | POA: Diagnosis not present

## 2020-12-18 DIAGNOSIS — Z885 Allergy status to narcotic agent status: Secondary | ICD-10-CM | POA: Diagnosis not present

## 2020-12-18 DIAGNOSIS — I10 Essential (primary) hypertension: Secondary | ICD-10-CM | POA: Diagnosis not present

## 2020-12-18 DIAGNOSIS — N179 Acute kidney failure, unspecified: Secondary | ICD-10-CM | POA: Diagnosis not present

## 2020-12-18 DIAGNOSIS — E785 Hyperlipidemia, unspecified: Secondary | ICD-10-CM | POA: Diagnosis not present

## 2020-12-18 DIAGNOSIS — Z7982 Long term (current) use of aspirin: Secondary | ICD-10-CM | POA: Diagnosis not present

## 2020-12-18 DIAGNOSIS — Z79899 Other long term (current) drug therapy: Secondary | ICD-10-CM | POA: Diagnosis not present

## 2020-12-18 DIAGNOSIS — E7800 Pure hypercholesterolemia, unspecified: Secondary | ICD-10-CM | POA: Diagnosis not present

## 2020-12-18 DIAGNOSIS — K219 Gastro-esophageal reflux disease without esophagitis: Secondary | ICD-10-CM | POA: Diagnosis not present

## 2020-12-18 DIAGNOSIS — Z880 Allergy status to penicillin: Secondary | ICD-10-CM | POA: Diagnosis not present

## 2020-12-18 DIAGNOSIS — M6282 Rhabdomyolysis: Secondary | ICD-10-CM | POA: Diagnosis not present

## 2020-12-18 DIAGNOSIS — E871 Hypo-osmolality and hyponatremia: Secondary | ICD-10-CM | POA: Diagnosis present

## 2020-12-18 DIAGNOSIS — F419 Anxiety disorder, unspecified: Secondary | ICD-10-CM | POA: Diagnosis not present

## 2020-12-18 LAB — BASIC METABOLIC PANEL
Anion gap: 10 (ref 5–15)
BUN: 27 mg/dL — ABNORMAL HIGH (ref 8–23)
CO2: 23 mmol/L (ref 22–32)
Calcium: 9.7 mg/dL (ref 8.9–10.3)
Chloride: 96 mmol/L — ABNORMAL LOW (ref 98–111)
Creatinine, Ser: 2.07 mg/dL — ABNORMAL HIGH (ref 0.44–1.00)
GFR, Estimated: 26 mL/min — ABNORMAL LOW (ref >60)
Glucose, Bld: 98 mg/dL (ref 70–99)
Potassium: 4.8 mmol/L (ref 3.5–5.1)
Sodium: 129 mmol/L — ABNORMAL LOW (ref 135–145)

## 2020-12-18 LAB — URINALYSIS, ROUTINE W REFLEX MICROSCOPIC
Bilirubin Urine: NEGATIVE
Glucose, UA: NEGATIVE mg/dL
Hgb urine dipstick: NEGATIVE
Ketones, ur: NEGATIVE mg/dL
Leukocytes,Ua: NEGATIVE
Nitrite: NEGATIVE
Protein, ur: NEGATIVE mg/dL
Specific Gravity, Urine: 1.015 (ref 1.005–1.030)
pH: 6 (ref 5.0–8.0)

## 2020-12-18 LAB — CBC WITH DIFFERENTIAL/PLATELET
Abs Immature Granulocytes: 0.01 10*3/uL (ref 0.00–0.07)
Basophils Absolute: 0 10*3/uL (ref 0.0–0.1)
Basophils Relative: 1 %
Eosinophils Absolute: 0 10*3/uL (ref 0.0–0.5)
Eosinophils Relative: 0 %
HCT: 32.9 % — ABNORMAL LOW (ref 36.0–46.0)
Hemoglobin: 10.4 g/dL — ABNORMAL LOW (ref 12.0–15.0)
Immature Granulocytes: 0 %
Lymphocytes Relative: 30 %
Lymphs Abs: 1.1 10*3/uL (ref 0.7–4.0)
MCH: 25.6 pg — ABNORMAL LOW (ref 26.0–34.0)
MCHC: 31.6 g/dL (ref 30.0–36.0)
MCV: 81 fL (ref 80.0–100.0)
Monocytes Absolute: 0.7 10*3/uL (ref 0.1–1.0)
Monocytes Relative: 21 %
Neutro Abs: 1.7 10*3/uL (ref 1.7–7.7)
Neutrophils Relative %: 48 %
Platelets: 357 10*3/uL (ref 150–400)
RBC: 4.06 MIL/uL (ref 3.87–5.11)
RDW: 17.8 % — ABNORMAL HIGH (ref 11.5–15.5)
WBC: 3.5 10*3/uL — ABNORMAL LOW (ref 4.0–10.5)
nRBC: 0 % (ref 0.0–0.2)

## 2020-12-18 LAB — CBC
HCT: 37.4 % (ref 36.0–46.0)
Hemoglobin: 11.8 g/dL — ABNORMAL LOW (ref 12.0–15.0)
MCH: 25.6 pg — ABNORMAL LOW (ref 26.0–34.0)
MCHC: 31.6 g/dL (ref 30.0–36.0)
MCV: 81.1 fL (ref 80.0–100.0)
Platelets: 326 10*3/uL (ref 150–400)
RBC: 4.61 MIL/uL (ref 3.87–5.11)
RDW: 18.1 % — ABNORMAL HIGH (ref 11.5–15.5)
WBC: 2.4 10*3/uL — ABNORMAL LOW (ref 4.0–10.5)
nRBC: 0 % (ref 0.0–0.2)

## 2020-12-18 LAB — MAGNESIUM: Magnesium: 1.6 mg/dL — ABNORMAL LOW (ref 1.7–2.4)

## 2020-12-18 LAB — CK
Total CK: 545 U/L — ABNORMAL HIGH (ref 38–234)
Total CK: 456 U/L — ABNORMAL HIGH (ref 38–234)

## 2020-12-18 LAB — CREATININE, SERUM
Creatinine, Ser: 1.58 mg/dL — ABNORMAL HIGH (ref 0.44–1.00)
GFR, Estimated: 36 mL/min — ABNORMAL LOW (ref >60)

## 2020-12-18 LAB — HEPATIC FUNCTION PANEL
ALT: 46 U/L — ABNORMAL HIGH (ref 0–44)
AST: 51 U/L — ABNORMAL HIGH (ref 15–41)
Albumin: 4.3 g/dL (ref 3.5–5.0)
Alkaline Phosphatase: 76 U/L (ref 38–126)
Bilirubin, Direct: 0.1 mg/dL (ref 0.0–0.2)
Indirect Bilirubin: 0.1 mg/dL — ABNORMAL LOW (ref 0.3–0.9)
Total Bilirubin: 0.2 mg/dL — ABNORMAL LOW (ref 0.3–1.2)
Total Protein: 7.7 g/dL (ref 6.5–8.1)

## 2020-12-18 LAB — RESP PANEL BY RT-PCR (FLU A&B, COVID) ARPGX2
Influenza A by PCR: NEGATIVE
Influenza B by PCR: NEGATIVE
SARS Coronavirus 2 by RT PCR: NEGATIVE

## 2020-12-18 MED ORDER — ENOXAPARIN SODIUM 30 MG/0.3ML ~~LOC~~ SOLN: 30.0000 mg | SUBCUTANEOUS | Status: DC

## 2020-12-18 MED ORDER — DIAZEPAM 5 MG PO TABS
5.0000 mg | ORAL_TABLET | Freq: Two times a day (BID) | ORAL | Status: DC | PRN
  Administered 2020-12-18: 5 mg via ORAL
  Filled 2020-12-18: qty 1

## 2020-12-18 MED ORDER — DIAZEPAM 5 MG PO TABS
5.0000 mg | ORAL_TABLET | Freq: Three times a day (TID) | ORAL | Status: DC | PRN
  Administered 2020-12-18: 5 mg via ORAL
  Filled 2020-12-18: qty 1

## 2020-12-18 MED ORDER — HALOPERIDOL LACTATE 5 MG/ML IJ SOLN: 2.0000 mg | Freq: Once | INTRAMUSCULAR | Status: DC

## 2020-12-18 MED ORDER — FERROUS SULFATE 325 (65 FE) MG PO TABS
325.0000 mg | ORAL_TABLET | Freq: Every day | ORAL | Status: DC
  Administered 2020-12-19 – 2020-12-20 (×2): 325 mg via ORAL
  Filled 2020-12-18 (×2): qty 1

## 2020-12-18 MED ORDER — ENOXAPARIN SODIUM 40 MG/0.4ML ~~LOC~~ SOLN
40.0000 mg | SUBCUTANEOUS | Status: DC
  Administered 2020-12-18 – 2020-12-20 (×3): 40 mg via SUBCUTANEOUS
  Filled 2020-12-18 (×3): qty 0.4

## 2020-12-18 MED ORDER — ACETAMINOPHEN 650 MG RE SUPP: 650.0000 mg | Freq: Four times a day (QID) | RECTAL | Status: DC | PRN

## 2020-12-18 MED ORDER — ADULT MULTIVITAMIN W/MINERALS CH
1.0000 | ORAL_TABLET | Freq: Every day | ORAL | Status: DC
  Administered 2020-12-18 – 2020-12-21 (×4): 1 via ORAL
  Filled 2020-12-18 (×4): qty 1

## 2020-12-18 MED ORDER — ACETAMINOPHEN 325 MG PO TABS
650.0000 mg | ORAL_TABLET | Freq: Four times a day (QID) | ORAL | Status: DC | PRN
  Administered 2020-12-19: 650 mg via ORAL
  Filled 2020-12-18: qty 2

## 2020-12-18 MED ORDER — ONDANSETRON HCL 4 MG/2ML IJ SOLN
4.0000 mg | Freq: Four times a day (QID) | INTRAMUSCULAR | Status: DC | PRN
  Administered 2020-12-18 – 2020-12-19 (×3): 4 mg via INTRAVENOUS
  Filled 2020-12-18 (×3): qty 2

## 2020-12-18 MED ORDER — TRAMADOL HCL 50 MG PO TABS
50.0000 mg | ORAL_TABLET | Freq: Three times a day (TID) | ORAL | Status: DC | PRN
  Administered 2020-12-18: 50 mg via ORAL
  Filled 2020-12-18: qty 1

## 2020-12-18 MED ORDER — ONDANSETRON HCL 4 MG PO TABS
4.0000 mg | ORAL_TABLET | Freq: Four times a day (QID) | ORAL | Status: DC | PRN
  Administered 2020-12-20: 4 mg via ORAL
  Filled 2020-12-18: qty 1

## 2020-12-18 MED ORDER — SODIUM CHLORIDE 0.9 % IV SOLN: INTRAVENOUS | Status: DC

## 2020-12-18 MED ORDER — MECLIZINE HCL 25 MG PO TABS
25.0000 mg | ORAL_TABLET | Freq: Three times a day (TID) | ORAL | Status: DC | PRN
  Administered 2020-12-18 – 2020-12-20 (×2): 25 mg via ORAL
  Filled 2020-12-18 (×2): qty 1

## 2020-12-18 MED ORDER — MAGNESIUM OXIDE 400 (241.3 MG) MG PO TABS
400.0000 mg | ORAL_TABLET | Freq: Two times a day (BID) | ORAL | Status: DC
  Administered 2020-12-18 – 2020-12-21 (×7): 400 mg via ORAL
  Filled 2020-12-18 (×7): qty 1

## 2020-12-18 MED ORDER — SODIUM CHLORIDE 0.9 % IV BOLUS
500.0000 mL | Freq: Once | INTRAVENOUS | Status: AC
  Administered 2020-12-18: 500 mL via INTRAVENOUS

## 2020-12-18 MED ORDER — LORAZEPAM 2 MG/ML IJ SOLN
0.5000 mg | Freq: Once | INTRAMUSCULAR | Status: AC
  Administered 2020-12-18: 0.5 mg via INTRAVENOUS
  Filled 2020-12-18: qty 1

## 2020-12-18 MED ORDER — TRAMADOL HCL 50 MG PO TABS
50.0000 mg | ORAL_TABLET | Freq: Two times a day (BID) | ORAL | Status: DC | PRN
  Administered 2020-12-18: 50 mg via ORAL
  Filled 2020-12-18: qty 1

## 2020-12-18 MED ORDER — SODIUM BICARBONATE 8.4 % IV SOLN
INTRAVENOUS | Status: DC
  Filled 2020-12-18: qty 850
  Filled 2020-12-18: qty 150
  Filled 2020-12-18: qty 850

## 2020-12-18 MED ORDER — LORATADINE 10 MG PO TABS
10.0000 mg | ORAL_TABLET | Freq: Every day | ORAL | Status: DC
  Administered 2020-12-18 – 2020-12-20 (×3): 10 mg via ORAL
  Filled 2020-12-18 (×3): qty 1

## 2020-12-18 MED ORDER — SODIUM BICARBONATE 8.4 % IV SOLN
INTRAVENOUS | Status: AC
  Filled 2020-12-18: qty 150

## 2020-12-18 NOTE — ED Provider Notes (Addendum)
[facility] EMERGENCY DEPARTMENT Provider Note   CSN: [id number] Arrival date & time: 12/17/20  2305     History Chief Complaint  Patient presents with  . body cramps    Nancy Tran is a 65 y.o. female.  The history is provided by the patient and the spouse.  Illness Location:  Entire body  Quality:  Spasms starting in the feet and going through the entire body to the hands  Severity:  Severe Onset quality:  Gradual Duration:  5 days Timing:  Constant Progression:  Unchanged Chronicity:  New Context:  Was recently on bactrim but this has stopped and had a doubling of her dose of Pravachol in the past 7 days by PMD  Relieved by:  Nothing  Worsened by:  Nothing  Ineffective treatments:  None tried  Associated symptoms: no abdominal pain, no chest pain, no congestion, no cough, no diarrhea, no ear pain, no fatigue, no fever, no headaches, no loss of consciousness, no myalgias, no nausea, no rash, no rhinorrhea, no shortness of breath, no sore throat, no vomiting and no wheezing   Risk factors:  None Patient with hypercholesterolemia who had a UTI and was seen by her PMD over a week ago and was initially placed on macrobid and then had an allergic reaction and this was discontinued and patient was switched to bactrim which she has now completed. Patient also recently had her dose of Pravachol doubled from 40 to 80 mg presents with ongoing muscle pain.  He baseline creatinin in our system and through Care everywhere is less than 1, she also has normal sodium.  She was seen in the ED at [facility] on 2/18 and given IVF for increased creatinine which was thought to be secondary to mild dehydration.  She has ongoing symptoms and checked into [facility] tonight but left secondary to wait times.  She has not spoken to her doctor about Pravachol at this time.       Past Medical History:  Diagnosis Date  . Adenoma of colon 2007   Adenomatous polyps in 2007, 2012, 2017.  GI MD is Tri Le  in [employer].    . Complication of anesthesia   . Diverticulitis 12/2019  . Gastritis, erosive 08/2018   EGD by Dr Mitchell Heir.    . High cholesterol   . History of cerebral aneurysm    bil internal carotid artery aneurysms.   embollization and stent to left 12/29/19 at [facility].    . Hypertension   . Normocytic anemia 2016  . PONV (postoperative nausea and vomiting)   . Tubal ligation status     Patient Active Problem List   Diagnosis Date Noted  . Dizziness 03/12/2020  . Acute lower GI bleeding 01/24/2020  . Syncope 01/24/2020  . GERD (gastroesophageal reflux disease) 01/24/2020  . Diverticulosis 01/24/2020  . Hypokalemia 01/10/2020  . Occult GI bleeding 01/10/2020  . AKI (acute kidney injury) ([place]) 01/10/2020  . Hypomagnesemia 01/10/2020  . Acute diverticulitis 01/09/2020  . Cerebral aneurysm 12/09/2019  . Family history of brain aneurysm 11/07/2019  . GAD (generalized anxiety disorder) 10/28/2019  . Tension-type headache, not intractable 10/28/2019  . Mild renal insufficiency 10/14/2019  . Leg swelling 07/20/2019  . Normochromic anemia 04/12/2019  . Gastritis determined by endoscopy 11/02/2018  . Cervical cancer screening 10/12/2018  . Neutropenia ([place]) 07/28/2018  . History of colonic polyps 06/24/2018  . Odynophagia 06/10/2018  . Thyroid nodule 06/10/2018  . Prediabetes 05/26/2015  . Osteoarthritis of knee  11/07/2014  . Essential hypertension 09/03/2014  . Hyperlipidemia LDL goal <130 10/26/2013  . Hidradenitis 06/10/2012  . Tubal ligation status     Past Surgical History:  Procedure Laterality Date  . BIOPSY  01/27/2020   Procedure: BIOPSY;  Surgeon: Gatha Mayer, MD;  Location: [facility] ENDOSCOPY;  Service: Endoscopy;;  . BREAST SURGERY    . CEREBRAL ANEURYSM REPAIR  12/29/2019  . COLONOSCOPY    . COLONOSCOPY WITH PROPOFOL N/A 01/27/2020   Procedure: COLONOSCOPY WITH PROPOFOL;  Surgeon: Gatha Mayer, MD;  Location: [facility] ENDOSCOPY;  Service: Endoscopy;   Laterality: N/A;  . IR ANGIOGRAM SELECTIVE EACH ADDITIONAL VESSEL  01/25/2020  . IR ANGIOGRAM SELECTIVE EACH ADDITIONAL VESSEL  01/25/2020  . IR ANGIOGRAM VISCERAL SELECTIVE  01/25/2020  . IR ANGIOGRAM VISCERAL SELECTIVE  01/25/2020  . IR US GUIDE VASC ACCESS RIGHT  01/25/2020  . TUBAL LIGATION       OB History   No obstetric history on file.     History reviewed. No pertinent family history.  Social History   Tobacco Use  . Smoking status: Former Research scientist (life sciences)  . Smokeless tobacco: Never Used  . Tobacco comment: QUIT IN 1992  Vaping Use  . Vaping Use: Never used  Substance Use Topics  . Alcohol use: Not Currently  . Drug use: No    Home Medications Prior to Admission medications   Medication Sig Start Date End Date Taking? Authorizing Provider  aspirin 81 MG EC tablet Take 81 mg by mouth daily. 12/22/19   [provider]  baclofen (LIORESAL) 10 MG tablet Take 5-10 mg by mouth every 8 (eight) hours as needed for muscle spasms. 11/10/19   [provider]  BRILINTA 90 MG TABS tablet Take 90 mg by mouth 2 (two) times daily. 12/10/19   [provider]  hydrochlorothiazide (MICROZIDE) 12.5 MG capsule Take 12.5 mg by mouth daily. 12/06/20   [provider]  hydrOXYzine (ATARAX/VISTARIL) 25 MG tablet TAKE 1 TABLET (25 MG TOTAL) BY MOUTH 3 (THREE) TIMES DAILY AS NEEDED FOR UP TO 7 DAYS. 12/11/20   [provider]  loratadine (CLARITIN) 10 MG tablet Take 1 tablet (10 mg total) by mouth daily. Patient taking differently: Take 10 mg by mouth daily as needed for allergies.  01/19/20   [name], MD  losartan (COZAAR) 100 MG tablet Take 100 mg by mouth daily. 11/16/20   [provider]  meclizine (ANTIVERT) 25 MG tablet Take 1 tablet (25 mg total) by mouth 3 (three) times daily as needed for dizziness or nausea. 03/12/20   Lucrezia Starch, MD  Multiple Vitamin (MULTIVITAMIN WITH MINERALS) TABS tablet Take 1 tablet by mouth daily.    [provider]  nitrofurantoin, macrocrystal-monohydrate, (MACROBID) 100 MG capsule Take 100 mg by mouth 2 (two) times daily. 12/11/20   [provider]  nystatin cream (MYCOSTATIN) APPLY TO AFFECTED AREA(S) ONE TO TWO TIMES DAILY AS NEEDED FOR UP TO 7 DAYS 12/07/20   [provider]  omeprazole (PRILOSEC) 40 MG capsule Take 40 mg by mouth daily. 12/20/19   [provider]  pravastatin (PRAVACHOL) 40 MG tablet Take 40 mg by mouth daily. 12/25/19   [provider]  sulfamethoxazole-trimethoprim (BACTRIM DS) 800-160 MG tablet Take 1 tablet by mouth 2 (two) times daily. 12/11/20   [provider]    Allergies    Codeine, Darvocet [propoxyphene n-acetaminophen], Lisinopril, Morphine and related, Percocet [oxycodone-acetaminophen], Amoxicillin-pot clavulanate, and Penicillins  Review of Systems  Review of Systems  Constitutional: Negative for fatigue and fever.  HENT: Negative for congestion, ear pain, rhinorrhea and sore throat.   Eyes: Negative for visual disturbance.  Respiratory: Negative for cough, shortness of breath and wheezing.   Cardiovascular: Negative for chest pain.  Gastrointestinal: Negative for abdominal pain, diarrhea, nausea and vomiting.  Genitourinary: Negative for difficulty urinating.  Musculoskeletal: Negative for myalgias.  Skin: Negative for rash.  Neurological: Negative for loss of consciousness, facial asymmetry, weakness, numbness and headaches.  Psychiatric/Behavioral: Negative for agitation.  All other systems reviewed and are negative.   Physical Exam Updated Vital Signs BP (!) 139/48 (BP Location: Right Arm)   Pulse 67   Temp 97.7 F (36.5 C) (Oral)   Resp 18   Ht 5\' 7"  (1.702 m)   Wt 86.2 kg   SpO2 100%   BMI 29.76 kg/m   Physical Exam Vitals and nursing note reviewed.  Constitutional:      General: She is not in acute distress.    Appearance: Normal appearance.  HENT:     Head: Normocephalic and  atraumatic.     Nose: Nose normal.  Eyes:     Extraocular Movements: Extraocular movements intact.     Conjunctiva/sclera: Conjunctivae normal.     Pupils: Pupils are equal, round, and reactive to light.  Cardiovascular:     Rate and Rhythm: Normal rate and regular rhythm.     Pulses: Normal pulses.     Heart sounds: Normal heart sounds.  Pulmonary:     Effort: Pulmonary effort is normal.     Breath sounds: Normal breath sounds.  Abdominal:     General: Abdomen is flat. Bowel sounds are normal.     Palpations: Abdomen is soft.     Tenderness: There is no abdominal tenderness. There is no guarding or rebound.  Musculoskeletal:        General: Normal range of motion.     Cervical back: Normal range of motion and neck supple.  Skin:    General: Skin is warm and dry.     Capillary Refill: Capillary refill takes less than 2 seconds.  Neurological:     General: No focal deficit present.     Mental Status: She is alert and oriented to person, place, and time.     Deep Tendon Reflexes: Reflexes normal.  Psychiatric:        Thought Content: Thought content normal.     ED Results / Procedures / Treatments   Labs (all labs ordered are listed, but only abnormal results are displayed) Results for orders placed or performed during the hospital encounter of 12/17/20  CBC with Differential/Platelet  Result Value Ref Range   WBC 3.5 (L) 4.0 - 10.5 K/uL   RBC 4.06 3.87 - 5.11 MIL/uL   Hemoglobin 10.4 (L) 12.0 - 15.0 g/dL   HCT 32.9 (L) 36.0 - 46.0 %   MCV 81.0 80.0 - 100.0 fL   MCH 25.6 (L) 26.0 - 34.0 pg   MCHC 31.6 30.0 - 36.0 g/dL   RDW 17.8 (H) 11.5 - 15.5 %   Platelets 357 150 - 400 K/uL   nRBC 0.0 0.0 - 0.2 %   Neutrophils Relative % 48 %   Neutro Abs 1.7 1.7 - 7.7 K/uL   Lymphocytes Relative 30 %   Lymphs Abs 1.1 0.7 - 4.0 K/uL   Monocytes Relative 21 %   Monocytes Absolute 0.7 0.1 - 1.0 K/uL   Eosinophils Relative 0 %   Eosinophils  Absolute 0.0 0.0 - 0.5 K/uL   Basophils  Relative 1 %   Basophils Absolute 0.0 0.0 - 0.1 K/uL   Immature Granulocytes 0 %   Abs Immature Granulocytes 0.01 0.00 - 0.07 K/uL  Basic metabolic panel  Result Value Ref Range   Sodium 129 (L) 135 - 145 mmol/L   Potassium 4.8 3.5 - 5.1 mmol/L   Chloride 96 (L) 98 - 111 mmol/L   CO2 23 22 - 32 mmol/L   Glucose, Bld 98 70 - 99 mg/dL   BUN 27 (H) 8 - 23 mg/dL   Creatinine, Ser 2.07 (H) 0.44 - 1.00 mg/dL   Calcium 9.7 8.9 - 10.3 mg/dL   GFR, Estimated 26 (L) >60 mL/min   Anion gap 10 5 - 15  CK  Result Value Ref Range   Total CK 545 (H) 38 - 234 U/L  Magnesium  Result Value Ref Range   Magnesium 1.6 (L) 1.7 - 2.4 mg/dL   CT Head Wo Contrast  Result Date: 12/18/2020 CLINICAL DATA:  Maxillofacial pain EXAM: CT HEAD WITHOUT CONTRAST TECHNIQUE: Contiguous axial images were obtained from the base of the skull through the vertex without intravenous contrast. COMPARISON:  August 11, 2020 FINDINGS: Brain: No evidence of acute infarction, hemorrhage, hydrocephalus, extra-axial collection or mass lesion/mass effect. Vascular: No hyperdense vessel or unexpected calcification. Skull: Normal. Negative for fracture or focal lesion. Sinuses/Orbits: No acute finding. Other: None. IMPRESSION: No acute intracranial pathology. Electronically Signed   By: Virgina Norfolk M.D.   On: 12/18/2020 02:23    EKG  EKG Interpretation  Date/Time:  Monday December 18 2020 02:56:08 EST Ventricular Rate:  77 PR Interval:    QRS Duration: 90 QT Interval:  398 QTC Calculation: 451 R Axis:   44 Text Interpretation: Sinus rhythm Abnormal R-wave progression, early transition Baseline wander in lead(s) V6 Confirmed by Randal Buba, Macklin Jacquin ([id number]) on 12/18/2020 2:59:56 AM       Radiology No results found.  Procedures Procedures   Medications Ordered in ED Medications  ketorolac (TORADOL) 30 MG/ML injection 30 mg (30 mg Intravenous Not Given 12/18/20 0058)  LORazepam (ATIVAN) injection 0.5 mg (has no  administration in time range)  methocarbamol (ROBAXIN) injection 1,000 mg (1,000 mg Intravenous Given 12/18/20 0113)  sodium chloride 0.9 % bolus 500 mL (500 mLs Intravenous New Bag/Given 12/18/20 0111)    ED Course  I have reviewed the triage vital signs and the nursing notes.  Pertinent labs & imaging results that were available during my care of the patient were reviewed by me and considered in my medical decision making (see chart for details).   CT head obtained with no signs of space occupying lesion of infarct as source spasms and pain.  I suspect this is a statin-induced pain with rhabdomyolysis.  I suspect the CK was higher on the 18th but the CK was not checked at that time.  I suspect CK is improved today secondary to IV hydration during previous ER visit but creatinine and sodium have not improved and patient's symptoms have not improved as patient has not stopped this medication.  I further suspect this diagnosis due to the slight elevation in the patient's AST and ALT, statins are known to do this as well.  I have bolused the patient with NSS and have started D5W with 3 amps of bicarb which will hydrate the patient and alkalinize the urine to excrete any remaining muscle breakdown.  Moreover, this will improve the serum sodium and I believe  it will likely improve the patient's creatinine.  I have advised patient stop pravachol immediately and inform her doctor of this.  Both patient and husband verbalize understanding, with nurse Val and tech Amy present.  I believe continued hydration will improve patient's symptoms as well in addition to cessation of medication.    Aditri Crandle was evaluated in Emergency Department on 12/18/2020 for the symptoms described in the history of present illness. She was evaluated in the context of the global COVID-19 pandemic, which necessitated consideration that the patient might be at risk for infection with the SARS-CoV-2 virus that causes COVID-19.  Institutional protocols and algorithms that pertain to the evaluation of patients at risk for COVID-19 are in a state of rapid change based on information released by regulatory bodies including the CDC and federal and state organizations. These policies and algorithms were followed during the patient's care in the ED.  Final Clinical Impression(s) / ED Diagnoses Admit to medicine for ongoing pain with worsening renal function and unchanged sodium            Renzo Vincelette, MD 12/18/20 [phone]

## 2020-12-18 NOTE — Plan of Care (Signed)

## 2020-12-18 NOTE — H&P (Signed)
History and Physical    [place] NGE:[id number] DOB: [birth year] DOA: 12/17/2020  PCP: [name], PA-C  Patient coming from: [facility]  Chief Complaint: body aches, cramps  HPI: Nancy Tran is a 65 y.o. female with medical history significant of HTN, anxiety. Presenting with large muscle cramps. She reports that her pravastatin dose was doubled a couple of weeks ago. The start of her symptoms coincide with the doubling of that medication. She has tried mustard, pickle juice, and OTC cramp medications; however, nothing has helped. She went to the ED 4 days ago and was given toradol and ativan. This seemed to calm things temporarily, but symptoms returned over the weekend.  She decided that she needed to return for help last night. No other alleviating or aggravating factors noted.   ED Course: She was found to have an elevated CPK. She was started on bicarb. TRH was called for admission.   Review of Systems:  Review of systems is otherwise negative for all not mentioned in HPI.   PMHx Past Medical History:  Diagnosis Date  . Adenoma of colon 2007   Adenomatous polyps in 2007, 2012, 2017.  GI MD is [facility] in [employer].    . Complication of anesthesia   . Diverticulitis 12/2019  . Gastritis, erosive 08/2018   EGD by Dr Mitchell Heir.    . High cholesterol   . History of cerebral aneurysm    bil internal carotid artery aneurysms.   embollization and stent to left 12/29/19 at [facility].    . Hypertension   . Normocytic anemia 2016  . PONV (postoperative nausea and vomiting)   . Tubal ligation status     PSHx Past Surgical History:  Procedure Laterality Date  . BIOPSY  01/27/2020   Procedure: BIOPSY;  Surgeon: Gatha Mayer, MD;  Location: [facility];  Service: Endoscopy;;  . BREAST SURGERY    . CEREBRAL ANEURYSM REPAIR  12/29/2019  . COLONOSCOPY    . COLONOSCOPY WITH PROPOFOL N/A 01/27/2020   Procedure: COLONOSCOPY WITH PROPOFOL;  Surgeon: Gatha Mayer,  MD;  Location: [facility];  Service: Endoscopy;  Laterality: N/A;  . IR ANGIOGRAM SELECTIVE EACH ADDITIONAL VESSEL  01/25/2020  . IR ANGIOGRAM SELECTIVE EACH ADDITIONAL VESSEL  01/25/2020  . IR ANGIOGRAM VISCERAL SELECTIVE  01/25/2020  . IR ANGIOGRAM VISCERAL SELECTIVE  01/25/2020  . IR US GUIDE VASC ACCESS RIGHT  01/25/2020  . TUBAL LIGATION      SocHx  reports that she has quit smoking. She has never used smokeless tobacco. She reports previous alcohol use. She reports that she does not use drugs.  Allergies  Allergen Reactions  . Codeine   . Darvocet [Propoxyphene N-Acetaminophen]   . Lisinopril Other (See Comments)  . Macrobid [Nitrofurantoin Macrocrystal] Itching  . Morphine And Related Nausea And Vomiting  . Percocet [Oxycodone-Acetaminophen]   . Amoxicillin-Pot Clavulanate Rash    Other reaction(s): RASH Other reaction(s): RASH   . Penicillins Rash    FamHx History reviewed. No pertinent family history.  Prior to Admission medications   Medication Sig Start Date End Date Taking? Authorizing Provider  aspirin 81 MG EC tablet Take 81 mg by mouth daily. 12/22/19   [provider]  baclofen (LIORESAL) 10 MG tablet Take 5-10 mg by mouth every 8 (eight) hours as needed for muscle spasms. 11/10/19   [provider]  BRILINTA 90 MG TABS tablet Take 90 mg by mouth 2 (two) times daily. 12/10/19   [provider]  hydrochlorothiazide (MICROZIDE) 12.5 MG capsule Take 12.5 mg by mouth daily. 12/06/20   [provider]  hydrOXYzine (ATARAX/VISTARIL) 25 MG tablet TAKE 1 TABLET (25 MG TOTAL) BY MOUTH 3 (THREE) TIMES DAILY AS NEEDED FOR UP TO 7 DAYS. 12/11/20   [provider]  loratadine (CLARITIN) 10 MG tablet Take 1 tablet (10 mg total) by mouth daily. Patient taking differently: Take 10 mg by mouth daily as needed for allergies.  01/19/20   [name], MD  losartan (COZAAR) 100 MG tablet Take 100 mg by mouth daily. 11/16/20   [provider]  meclizine (ANTIVERT) 25 MG tablet Take 1 tablet (25 mg total) by mouth 3 (three) times daily as needed for dizziness or nausea. 03/12/20   Lucrezia Starch, MD  Multiple Vitamin (MULTIVITAMIN WITH MINERALS) TABS tablet Take 1 tablet by mouth daily.    [provider]  nitrofurantoin, macrocrystal-monohydrate, (MACROBID) 100 MG capsule Take 100 mg by mouth 2 (two) times daily. 12/11/20   [provider]  nystatin cream (MYCOSTATIN) APPLY TO AFFECTED AREA(S) ONE TO TWO TIMES DAILY AS NEEDED FOR UP TO 7 DAYS 12/07/20   [provider]  omeprazole (PRILOSEC) 40 MG capsule Take 40 mg by mouth daily. 12/20/19   [provider]  pravastatin (PRAVACHOL) 40 MG tablet Take 40 mg by mouth daily. 12/25/19   [provider]  sulfamethoxazole-trimethoprim (BACTRIM DS) 800-160 MG tablet Take 1 tablet by mouth 2 (two) times daily. 12/11/20   [provider]    Physical Exam: Vitals:   12/18/20 0630 12/18/20 0700 12/18/20 0840 12/18/20 0958  BP: (!) 117/59 (!) 98/53 112/60 (!) 126/55  Pulse: 80 68 68 69  Resp: (!) 23 15 17 20   Temp:   98.4 F (36.9 C) 98 F (36.7 C)  TempSrc:   Oral   SpO2: 97% 98% 100% 100%  Weight:      Height:        General: 65 y.o. female resting in bed in NAD Eyes: PERRL, normal sclera ENMT: Nares patent w/o discharge, orophaynx clear, dentition normal, ears w/o discharge/lesions/ulcers Neck: Supple, trachea midline Cardiovascular: RRR, +S1, S2, no m/g/r, equal pulses throughout Respiratory: CTABL, no w/r/r, normal WOB GI: BS+, NDNT, no masses noted, no organomegaly noted MSK: No e/c/c Skin: No rashes, bruises, ulcerations noted Neuro: A&O x 3, no focal deficits Psyc: Appropriate interaction and affect, calm/cooperative  Labs on Admission: I have personally reviewed following labs and imaging studies  CBC: Recent Labs  Lab 12/15/20 0200 12/18/20 0052  WBC 4.7 3.5*  NEUTROABS 2.8 1.7  HGB 10.1* 10.4*   HCT 31.4* 32.9*  MCV 79.1* 81.0  PLT 315 563   Basic Metabolic Panel: Recent Labs  Lab 12/15/20 0200 12/18/20 0052  NA 128* 129*  K 4.8 4.8  CL 96* 96*  CO2 23 23  GLUCOSE 102* 98  BUN 37* 27*  CREATININE 1.88* 2.07*  CALCIUM 9.3 9.7  MG  --  1.6*   GFR: Estimated Creatinine Clearance: 30.5 mL/min (A) (by C-G formula based on SCr of 2.07 mg/dL (H)). Liver Function Tests: Recent Labs  Lab 12/18/20 0052  AST 51*  ALT 46*  ALKPHOS 76  BILITOT 0.2*  PROT 7.7  ALBUMIN 4.3   No results for input(s): LIPASE, AMYLASE in the last 168 hours. No results for input(s): AMMONIA in the last 168 hours. Coagulation Profile: No results for input(s): INR, PROTIME in the last 168 hours. Cardiac Enzymes: Recent Labs  Lab 12/18/20 0052  CKTOTAL 545*   BNP (last 3 results) No results for input(s): PROBNP in the last 8760 hours. HbA1C: No results for input(s): HGBA1C in the last 72 hours. CBG: No results for input(s): GLUCAP in the last 168 hours. Lipid Profile: No results for input(s): CHOL, HDL, LDLCALC, TRIG, CHOLHDL, LDLDIRECT in the last 72 hours. Thyroid Function Tests: No results for input(s): TSH, T4TOTAL, FREET4, T3FREE, THYROIDAB in the last 72 hours. Anemia Panel: No results for input(s): VITAMINB12, FOLATE, FERRITIN, TIBC, IRON, RETICCTPCT in the last 72 hours. Urine analysis:    Component Value Date/Time   COLORURINE STRAW (A) 12/18/2020 0156   APPEARANCEUR CLEAR 12/18/2020 0156   LABSPEC 1.015 12/18/2020 0156   PHURINE 6.0 12/18/2020 0156   GLUCOSEU NEGATIVE 12/18/2020 0156   HGBUR NEGATIVE 12/18/2020 0156   BILIRUBINUR NEGATIVE 12/18/2020 0156   KETONESUR NEGATIVE 12/18/2020 0156   PROTEINUR NEGATIVE 12/18/2020 0156   UROBILINOGEN 0.2 03/28/2013 1921   NITRITE NEGATIVE 12/18/2020 0156   LEUKOCYTESUR NEGATIVE 12/18/2020 0156    Radiological Exams on Admission: CT Head Wo Contrast  Result Date: 12/18/2020 CLINICAL DATA:  Maxillofacial pain EXAM: CT  HEAD WITHOUT CONTRAST TECHNIQUE: Contiguous axial images were obtained from the base of the skull through the vertex without intravenous contrast. COMPARISON:  August 11, 2020 FINDINGS: Brain: No evidence of acute infarction, hemorrhage, hydrocephalus, extra-axial collection or mass lesion/mass effect. Vascular: No hyperdense vessel or unexpected calcification. Skull: Normal. Negative for fracture or focal lesion. Sinuses/Orbits: No acute finding. Other: None. IMPRESSION: No acute intracranial pathology. Electronically Signed   By: Virgina Norfolk M.D.   On: 12/18/2020 02:23    EKG: Independently reviewed. Sinus, no st elevations  Assessment/Plan Possible statin-induced rhabdo Cramping     - place obs-tele     - hold statin     - currently on bicarb gtt; rpt CPK, if low enough, convert gtt to NS     - add valium  AKI     - renal US     - fluids     - hold ARB, diuretic  HTN     - hold ARB, diuretic; BP is ok right now  GERD     - protonix  Hyponatremia Hypomagnesemia     - fluids, replace Mg2+  Normocytic anemia Iron-deficiency anemia     - no evidence of bleed; she is on Fe2+ at baseline; continue  Elevated LFTs     - mild, rpt lab  HLD     - hold statin d/t rhabdo  DVT prophylaxis: lovenox  Code Status: FULL  Family Communication: W/ husband at bedside  Consults called: None   Status is: Observation  The patient remains OBS appropriate and will d/c before 2 midnights.  Dispo: The patient is from: Home              Anticipated d/c is to: Home              Anticipated d/c date is: 1 day              Patient currently is not medically stable to d/c.   Difficult to place patient No  Jonnie Finner DO [facility]  If 7PM-7AM, please contact night-coverage [email or web address]  12/18/2020, 10:19 AM

## 2020-12-18 NOTE — ED Notes (Signed)
PIV attempted x 2. First attempt RFA, second attempt L wrist.

## 2020-12-18 NOTE — Progress Notes (Signed)
Pt. Came in the unit via stretcher, Pt is alert and oriented. Vital sign taken and recorded. Needs attended to.

## 2020-12-19 DIAGNOSIS — M6282 Rhabdomyolysis: Secondary | ICD-10-CM | POA: Diagnosis present

## 2020-12-19 DIAGNOSIS — Z20822 Contact with and (suspected) exposure to covid-19: Secondary | ICD-10-CM | POA: Diagnosis present

## 2020-12-19 DIAGNOSIS — F419 Anxiety disorder, unspecified: Secondary | ICD-10-CM | POA: Diagnosis present

## 2020-12-19 DIAGNOSIS — Z79899 Other long term (current) drug therapy: Secondary | ICD-10-CM | POA: Diagnosis not present

## 2020-12-19 DIAGNOSIS — N179 Acute kidney failure, unspecified: Secondary | ICD-10-CM | POA: Diagnosis not present

## 2020-12-19 DIAGNOSIS — I10 Essential (primary) hypertension: Secondary | ICD-10-CM | POA: Diagnosis present

## 2020-12-19 DIAGNOSIS — Z881 Allergy status to other antibiotic agents status: Secondary | ICD-10-CM | POA: Diagnosis not present

## 2020-12-19 DIAGNOSIS — E7800 Pure hypercholesterolemia, unspecified: Secondary | ICD-10-CM | POA: Diagnosis present

## 2020-12-19 DIAGNOSIS — N289 Disorder of kidney and ureter, unspecified: Secondary | ICD-10-CM | POA: Diagnosis not present

## 2020-12-19 DIAGNOSIS — Z880 Allergy status to penicillin: Secondary | ICD-10-CM | POA: Diagnosis not present

## 2020-12-19 DIAGNOSIS — E871 Hypo-osmolality and hyponatremia: Secondary | ICD-10-CM | POA: Diagnosis present

## 2020-12-19 DIAGNOSIS — Z86010 Personal history of colonic polyps: Secondary | ICD-10-CM | POA: Diagnosis not present

## 2020-12-19 DIAGNOSIS — K219 Gastro-esophageal reflux disease without esophagitis: Secondary | ICD-10-CM | POA: Diagnosis present

## 2020-12-19 DIAGNOSIS — Z7982 Long term (current) use of aspirin: Secondary | ICD-10-CM | POA: Diagnosis not present

## 2020-12-19 DIAGNOSIS — Z87891 Personal history of nicotine dependence: Secondary | ICD-10-CM | POA: Diagnosis not present

## 2020-12-19 DIAGNOSIS — D509 Iron deficiency anemia, unspecified: Secondary | ICD-10-CM | POA: Diagnosis present

## 2020-12-19 DIAGNOSIS — Z885 Allergy status to narcotic agent status: Secondary | ICD-10-CM | POA: Diagnosis not present

## 2020-12-19 DIAGNOSIS — E785 Hyperlipidemia, unspecified: Secondary | ICD-10-CM | POA: Diagnosis present

## 2020-12-19 DIAGNOSIS — Z9851 Tubal ligation status: Secondary | ICD-10-CM | POA: Diagnosis not present

## 2020-12-19 LAB — COMPREHENSIVE METABOLIC PANEL
ALT: 47 U/L — ABNORMAL HIGH (ref 0–44)
AST: 43 U/L — ABNORMAL HIGH (ref 15–41)
Albumin: 3.8 g/dL (ref 3.5–5.0)
Alkaline Phosphatase: 67 U/L (ref 38–126)
Anion gap: 7 (ref 5–15)
BUN: 15 mg/dL (ref 8–23)
CO2: 29 mmol/L (ref 22–32)
Calcium: 9.2 mg/dL (ref 8.9–10.3)
Chloride: 100 mmol/L (ref 98–111)
Creatinine, Ser: 1.17 mg/dL — ABNORMAL HIGH (ref 0.44–1.00)
GFR, Estimated: 52 mL/min — ABNORMAL LOW (ref >60)
Glucose, Bld: 100 mg/dL — ABNORMAL HIGH (ref 70–99)
Potassium: 4.8 mmol/L (ref 3.5–5.1)
Sodium: 136 mmol/L (ref 135–145)
Total Bilirubin: 0.5 mg/dL (ref 0.3–1.2)
Total Protein: 6.7 g/dL (ref 6.5–8.1)

## 2020-12-19 LAB — CK: Total CK: 263 U/L — ABNORMAL HIGH (ref 38–234)

## 2020-12-19 LAB — CBC
HCT: 32.1 % — ABNORMAL LOW (ref 36.0–46.0)
Hemoglobin: 10 g/dL — ABNORMAL LOW (ref 12.0–15.0)
MCH: 25.6 pg — ABNORMAL LOW (ref 26.0–34.0)
MCHC: 31.2 g/dL (ref 30.0–36.0)
MCV: 82.3 fL (ref 80.0–100.0)
Platelets: 296 10*3/uL (ref 150–400)
RBC: 3.9 MIL/uL (ref 3.87–5.11)
RDW: 18 % — ABNORMAL HIGH (ref 11.5–15.5)
WBC: 2.8 10*3/uL — ABNORMAL LOW (ref 4.0–10.5)
nRBC: 0 % (ref 0.0–0.2)

## 2020-12-19 MED ORDER — SODIUM CHLORIDE 0.9 % IV SOLN: INTRAVENOUS | Status: DC

## 2020-12-19 MED ORDER — PANTOPRAZOLE SODIUM 40 MG PO TBEC
40.0000 mg | DELAYED_RELEASE_TABLET | Freq: Every day | ORAL | Status: DC
  Administered 2020-12-19 – 2020-12-21 (×3): 40 mg via ORAL
  Filled 2020-12-19 (×3): qty 1

## 2020-12-19 MED ORDER — POLYETHYLENE GLYCOL 3350 17 G PO PACK: 17.0000 g | PACK | Freq: Every day | ORAL | Status: DC | PRN

## 2020-12-19 MED ORDER — BACLOFEN 10 MG PO TABS: 5.0000 mg | ORAL_TABLET | Freq: Three times a day (TID) | ORAL | Status: DC | PRN

## 2020-12-19 MED ORDER — PROMETHAZINE HCL 25 MG/ML IJ SOLN: 12.5000 mg | Freq: Four times a day (QID) | INTRAMUSCULAR | Status: DC | PRN

## 2020-12-19 MED ORDER — PSYLLIUM 95 % PO PACK
PACK | Freq: Every day | ORAL | Status: DC
  Administered 2020-12-19 – 2020-12-21 (×3): 1 via ORAL
  Filled 2020-12-19 (×3): qty 1

## 2020-12-19 NOTE — Progress Notes (Addendum)
PROGRESS NOTE  Nancy Tran IWL:[id number] DOB: 07/17/1956 DOA: 12/17/2020 PCP: [name], PA-C  HPI/Recap of past 24 hours: Nancy Tran is a 65 y.o. female with medical history significant of  essential HTN, GERD, iron deficiency anemia, hyperlipidemia. Presenting with large muscle cramps. She reports that her pravastatin dose was doubled a couple of weeks ago. The start of her symptoms coincide with the doubling of that medication. She has tried mustard, pickle juice, and OTC cramp medications; however, nothing has helped. She went to the ED 4 days ago and was given toradol and ativan. This seemed to calm things temporarily, but symptoms returned over the weekend.  She decided that she needed to return for help night of 12/17/20. No other alleviating or aggravating factors.  Work-up revealed rhabdomyolysis, suspected statin induced, electrolyte derangement, and AKI.  She received IV fluids hydration and electrolyte replacement.  Renal ultrasound negative for hydronephrosis or acute findings.  12/19/20: Seen and examined at her bedside this morning.  She reports nausea and vomiting this morning as well as persistent intermittent muscle cramps involving upper and lower extremities.   Assessment/Plan: Active Problems:   ARF (acute renal failure) (HCC)  Suspected statin due to rhabdomyolysis, improving with IV fluid hydration She presented with severe muscle cramping of 5 days duration Home statin held Work-up revealed rhabdomyolysis, suspected statin induced, electrolyte derangement, and AKI.  She received IV fluids hydration and electrolyte replacement.  Renal ultrasound negative for hydronephrosis or acute findings. Rhabdomyolysis is improving, AKI is also improving. Can decrease IV fluid rate to 75 cc/h x 1 day Patient is on HCTZ and losartan at home, will continue to hold off Repeat CPK in the morning.  AKI, suspect multifactorial, secondary to rhabdomyolysis and  prerenal in the setting of dehydration from poor oral intake Presented with creatinine of 2.07 with GFR of 26. Baseline creatinine appears to be 0.7 with GFR greater than 60. Creatinine 1.1 with GFR 52. Hold off losartan and HCTZ. Avoid nephrotoxins, dehydration, hypotension. Continue IV fluid hydration Monitor urine output Repeat BMP in the morning  Intractable nausea and vomiting, unclear etiology. She reports recent endoscopy evaluation at [facility] on 12/13/2020, states she has a history of gastric ulcer. Ordered medical record to be sent to [place] Resume home PPI Continue IV antiemetics Diet as tolerated.  Elevated liver chemistries, possibly statin related AST and ALT are downtrending. Avoid hepatotoxic agents Continue to hold off statin Continue to monitor  Essential hypertension, BPs are currently soft. Continue to hold off home losartan and HCTZ. Continue to monitor vital signs.  Iron deficiency anemia Resume iron supplement Hemoglobin is currently stable at 10.0 No overt bleeding.  GERD Resume home PPI daily  Acute leukopenia WBC uptrending 2.8 from 2.4. Continue to monitor Repeat CBC in the morning  Resolved hypovolemic hyponatremia, likely contributed by HCTZ. Presented with serum sodium 128 Continue to hold off home HCTZ. Received normal saline IV fluid hydration Repeated serum sodium 136 Continue to monitor     Code Status: Full code.  Family Communication: None at bedside.  Disposition Plan: Likely will discharge to home on 12/20/2020 once symptomatology has improved.   Consultants:  None.  Procedures:  None.  Antimicrobials:  None.  DVT prophylaxis: Subcu Lovenox daily  Status is: Inpatient    Dispo: The patient is from: Home.              Anticipated d/c is to: Home.  Anticipated d/c date is: 12/20/2020.              Patient currently not stable for DC due to ongoing management of nausea and vomiting,  rhabdomyolysis, AKI.   Difficult to place patient, not applicable.        Objective: Vitals:   12/18/20 1342 12/18/20 1936 12/18/20 2146 12/19/20 0330  BP: (!) 125/54 (!) 125/59 (!) 129/58 118/62  Pulse: 69 64 76 64  Resp: 17 15 15 15   Temp: 98 F (36.7 C) 98.3 F (36.8 C) 98.4 F (36.9 C) 97.9 F (36.6 C)  TempSrc:  Oral Oral Oral  SpO2: 100% 100% 100% 99%  Weight:      Height:        Intake/Output Summary (Last 24 hours) at 12/19/2020 1416 Last data filed at 12/19/2020 0500 Gross per 24 hour  Intake 2582.61 ml  Output --  Net 2582.61 ml   Filed Weights   12/17/20 2324  Weight: 86.2 kg    Exam:  . General: 65 y.o. year-old female well developed well nourished in no acute distress.  Alert and oriented x3. . Cardiovascular: Regular rate and rhythm with no rubs or gallops.  No thyromegaly or JVD noted.   Marland Kitchen Respiratory: Clear to auscultation with no wheezes or rales. Good inspiratory effort. . Abdomen: Soft nontender nondistended with normal bowel sounds x4 quadrants. . Musculoskeletal: No lower extremity edema. 2/4 pulses in all 4 extremities. . Skin: No ulcerative lesions noted or rashes . Psychiatry: Mood is appropriate for condition and setting   Data Reviewed: CBC: Recent Labs  Lab 12/15/20 0200 12/18/20 0052 12/18/20 1135 12/19/20 0358  WBC 4.7 3.5* 2.4* 2.8*  NEUTROABS 2.8 1.7  --   --   HGB 10.1* 10.4* 11.8* 10.0*  HCT 31.4* 32.9* 37.4 32.1*  MCV 79.1* 81.0 81.1 82.3  PLT 315 357 326 283   Basic Metabolic Panel: Recent Labs  Lab 12/15/20 0200 12/18/20 0052 12/18/20 1135 12/19/20 0358  NA 128* 129*  --  136  K 4.8 4.8  --  4.8  CL 96* 96*  --  100  CO2 23 23  --  29  GLUCOSE 102* 98  --  100*  BUN 37* 27*  --  15  CREATININE 1.88* 2.07* 1.58* 1.17*  CALCIUM 9.3 9.7  --  9.2  MG  --  1.6*  --   --    GFR: Estimated Creatinine Clearance: 54 mL/min (A) (by C-G formula based on SCr of 1.17 mg/dL (H)). Liver Function Tests: Recent Labs   Lab 12/18/20 0052 12/19/20 0358  AST 51* 43*  ALT 46* 47*  ALKPHOS 76 67  BILITOT 0.2* 0.5  PROT 7.7 6.7  ALBUMIN 4.3 3.8   No results for input(s): LIPASE, AMYLASE in the last 168 hours. No results for input(s): AMMONIA in the last 168 hours. Coagulation Profile: No results for input(s): INR, PROTIME in the last 168 hours. Cardiac Enzymes: Recent Labs  Lab 12/18/20 0052 12/18/20 1135 12/19/20 0358  CKTOTAL 545* 456* 263*   BNP (last 3 results) No results for input(s): PROBNP in the last 8760 hours. HbA1C: No results for input(s): HGBA1C in the last 72 hours. CBG: No results for input(s): GLUCAP in the last 168 hours. Lipid Profile: No results for input(s): CHOL, HDL, LDLCALC, TRIG, CHOLHDL, LDLDIRECT in the last 72 hours. Thyroid Function Tests: No results for input(s): TSH, T4TOTAL, FREET4, T3FREE, THYROIDAB in the last 72 hours. Anemia Panel: No results for input(s): VITAMINB12,  FOLATE, FERRITIN, TIBC, IRON, RETICCTPCT in the last 72 hours. Urine analysis:    Component Value Date/Time   COLORURINE STRAW (A) 12/18/2020 0156   APPEARANCEUR CLEAR 12/18/2020 0156   LABSPEC 1.015 12/18/2020 0156   PHURINE 6.0 12/18/2020 0156   GLUCOSEU NEGATIVE 12/18/2020 0156   HGBUR NEGATIVE 12/18/2020 0156   BILIRUBINUR NEGATIVE 12/18/2020 0156   KETONESUR NEGATIVE 12/18/2020 0156   PROTEINUR NEGATIVE 12/18/2020 0156   UROBILINOGEN 0.2 03/28/2013 1921   NITRITE NEGATIVE 12/18/2020 0156   LEUKOCYTESUR NEGATIVE 12/18/2020 0156   Sepsis Labs: @LABRCNTIP (procalcitonin:4,lacticidven:4)  ) Recent Results (from the past 240 hour(s))  Resp Panel by RT-PCR (Flu A&B, Covid) Nasopharyngeal Swab     Status: None   Collection Time: 12/18/20  3:56 AM   Specimen: Nasopharyngeal Swab; Nasopharyngeal(NP) swabs in vial transport medium  Result Value Ref Range Status   SARS Coronavirus 2 by RT PCR NEGATIVE NEGATIVE Final    Comment: (NOTE) SARS-CoV-2 target nucleic acids are NOT  DETECTED.  The SARS-CoV-2 RNA is generally detectable in upper respiratory specimens during the acute phase of infection. The lowest concentration of SARS-CoV-2 viral copies this assay can detect is 138 copies/mL. A negative result does not preclude SARS-Cov-2 infection and should not be used as the sole basis for treatment or other patient management decisions. A negative result may occur with  improper specimen collection/handling, submission of specimen other than nasopharyngeal swab, presence of viral mutation(s) within the areas targeted by this assay, and inadequate number of viral copies(<138 copies/mL). A negative result must be combined with clinical observations, patient history, and epidemiological information. The expected result is Negative.  Fact Sheet for Patients:  EntrepreneurPulse.com.au  Fact Sheet for Healthcare Providers:  IncredibleEmployment.be  This test is no t yet approved or cleared by the [place] FDA and  has been authorized for detection and/or diagnosis of SARS-CoV-2 by FDA under an Emergency Use Authorization (EUA). This EUA will remain  in effect (meaning this test can be used) for the duration of the COVID-19 declaration under Section 564(b)(1) of the Act, 21 U.S.C.section 360bbb-3(b)(1), unless the authorization is terminated  or revoked sooner.       Influenza A by PCR NEGATIVE NEGATIVE Final   Influenza B by PCR NEGATIVE NEGATIVE Final    Comment: (NOTE) The Xpert Xpress SARS-CoV-2/FLU/RSV plus assay is intended as an aid in the diagnosis of influenza from Nasopharyngeal swab specimens and should not be used as a sole basis for treatment. Nasal washings and aspirates are unacceptable for Xpert Xpress SARS-CoV-2/FLU/RSV testing.  Fact Sheet for Patients: EntrepreneurPulse.com.au  Fact Sheet for Healthcare Providers: IncredibleEmployment.be  This test is not yet  approved or cleared by the [place] FDA and has been authorized for detection and/or diagnosis of SARS-CoV-2 by FDA under an Emergency Use Authorization (EUA). This EUA will remain in effect (meaning this test can be used) for the duration of the COVID-19 declaration under Section 564(b)(1) of the Act, 21 U.S.C. section 360bbb-3(b)(1), unless the authorization is terminated or revoked.  Performed at [facility], [address] [place], [place] [address]       Studies: No results found.  Scheduled Meds: . enoxaparin (LOVENOX) injection  40 mg Subcutaneous Q24H  . ferrous sulfate  325 mg Oral Q breakfast  . loratadine  10 mg Oral Daily  . magnesium oxide  400 mg Oral BID  . multivitamin with minerals  1 tablet Oral Daily  . pantoprazole  40 mg Oral Daily  .  psyllium   Oral Daily    Continuous Infusions: . sodium chloride 75 mL/hr at 12/19/20 1220     LOS: 0 days     Kayleen Memos, MD [facility] Pager [phone]  If 7PM-7AM, please contact night-coverage [email or web address] Password [facility] 12/19/2020, 2:16 PM

## 2020-12-20 DIAGNOSIS — N289 Disorder of kidney and ureter, unspecified: Secondary | ICD-10-CM

## 2020-12-20 LAB — CBC
HCT: 32.9 % — ABNORMAL LOW (ref 36.0–46.0)
Hemoglobin: 10.2 g/dL — ABNORMAL LOW (ref 12.0–15.0)
MCH: 25.9 pg — ABNORMAL LOW (ref 26.0–34.0)
MCHC: 31 g/dL (ref 30.0–36.0)
MCV: 83.5 fL (ref 80.0–100.0)
Platelets: 301 10*3/uL (ref 150–400)
RBC: 3.94 MIL/uL (ref 3.87–5.11)
RDW: 18 % — ABNORMAL HIGH (ref 11.5–15.5)
WBC: 2.7 10*3/uL — ABNORMAL LOW (ref 4.0–10.5)
nRBC: 0 % (ref 0.0–0.2)

## 2020-12-20 LAB — COMPREHENSIVE METABOLIC PANEL
ALT: 44 U/L (ref 0–44)
AST: 40 U/L (ref 15–41)
Albumin: 3.6 g/dL (ref 3.5–5.0)
Alkaline Phosphatase: 71 U/L (ref 38–126)
Anion gap: 8 (ref 5–15)
BUN: 12 mg/dL (ref 8–23)
CO2: 28 mmol/L (ref 22–32)
Calcium: 9.2 mg/dL (ref 8.9–10.3)
Chloride: 103 mmol/L (ref 98–111)
Creatinine, Ser: 1.03 mg/dL — ABNORMAL HIGH (ref 0.44–1.00)
GFR, Estimated: >60 mL/min (ref >60)
Glucose, Bld: 95 mg/dL (ref 70–99)
Potassium: 4.7 mmol/L (ref 3.5–5.1)
Sodium: 139 mmol/L (ref 135–145)
Total Bilirubin: 0.5 mg/dL (ref 0.3–1.2)
Total Protein: 6.5 g/dL (ref 6.5–8.1)

## 2020-12-20 LAB — CK: Total CK: 158 U/L (ref 38–234)

## 2020-12-20 LAB — MAGNESIUM: Magnesium: 1.9 mg/dL (ref 1.7–2.4)

## 2020-12-20 LAB — PHOSPHORUS: Phosphorus: 3.7 mg/dL (ref 2.5–4.6)

## 2020-12-20 MED ORDER — FERROUS SULFATE 325 (65 FE) MG PO TABS
325.0000 mg | ORAL_TABLET | Freq: Two times a day (BID) | ORAL | Status: DC
  Administered 2020-12-20 – 2020-12-21 (×2): 325 mg via ORAL
  Filled 2020-12-20 (×2): qty 1

## 2020-12-20 NOTE — Progress Notes (Addendum)
PROGRESS NOTE   Nancy Tran  ACZ:[id number] DOB: October 26, 1956 DOA: 12/17/2020 PCP: [name], PA-C  Brief Narrative:  65 year old F Known HTN, reflux, iron deficiency anemia, hyperlipidemia Recent EGD 12/12/2020 by [name] Le-at [facility] esophagus appeared normal irregular Z-line with normal mucosa [?  Intestinal AVMs]-also recently placed on Macrodantin for UTI around the same time Recent myalgias with Pravachol dosing increase in the past several weeks  Presented to the ED 12/18/2020 with muscle cramping Found to have nontraumatic rhabdomyolysis AKI-started on bicarb   Assessment & Plan:    Statin induced rhabdomyolysis, transaminitis CKs serially trending downward, LFTs normalized Still has some mild persisting weakness but ambulatory a little bit today with mild unsteadiness   AKI secondary to rhabdo, recent Macrodantin Likely if continues to be stable in terms of AKI tomorrow can discharge  Nausea vomiting Resolved tolerating diet well without any issues Outpatient follow-up 1 week with PCP who we will CC  Hypotension on admission from AKI Prior to admission losartan 100, HCTZ 12.5 held Recheck blood pressures at home next 1 week and outpatient follow-up with PCP  Reflux Continue pantoprazole 40 daily  ?  Intestinal AVMs Recent EGD performed at [facility] showed no pathology She knows to follow-up with [name] Comment of [facility] gastroenterology and has already been called by the office for sooner work-up of the same We have increased her iron to twice daily  Unexplained leukopenia Recheck labs outpatient setting-further work-up pending the same  DVT prophylaxis: Lovenox Code Status: Full Family Communication: Discussed in detail with husband Mr. Kajal Scalici [phone] who is present at the bedside who understands and went over entire database with family Disposition:  Status is: Inpatient  Remains inpatient appropriate because:Hemodynamically  unstable, Ongoing active pain requiring inpatient pain management, Altered mental status and Ongoing diagnostic testing needed not appropriate for outpatient work up   Dispo: The patient is from: Home              Anticipated d/c is to: Home              Anticipated d/c date is: 1 day              Patient currently is not medically stable to d/c.   Difficult to place patient No       Consultants:   None currently  Procedures:   Antimicrobials: None   Subjective: Awake coherent quite anxious as she had just received a call from her [place] office No dark stool no tarry stool no chest pain no fever no chills Tolerating diet fairly well without other complaints Ambulated in hallway earlier today and felt mildly dizzy-usually does not use meclizine   Objective: Vitals:   12/19/20 0330 12/19/20 1435 12/19/20 2015 12/20/20 0356  BP: 118/62 136/62 125/64 124/75  Pulse: 64 66 64 62  Resp: 15 18 18 15   Temp: 97.9 F (36.6 C) 97.9 F (36.6 C) 98.3 F (36.8 C) 97.8 F (36.6 C)  TempSrc: Oral  Oral Oral  SpO2: 99% 100% 100% 100%  Weight:      Height:        Intake/Output Summary (Last 24 hours) at 12/20/2020 1633 Last data filed at 12/20/2020 0200 Gross per 24 hour  Intake 349.17 ml  Output --  Net 349.17 ml   Filed Weights   12/17/20 2324  Weight: 86.2 kg    Examination:  Awake coherent alert no distress EOMI NCAT no focal deficit power 5/5 moving all  4 limbs equally no distress S1-S2 no murmur no rub no gallop Abdomen soft no rebound no guarding No lower extremity edema Reflexes are intact 4/4 bilaterally Mild tenderness across larger back muscles no specific anatomical distribution  Data Reviewed: personally reviewed   CBC    Component Value Date/Time   WBC 2.7 (L) 12/20/2020 0411   RBC 3.94 12/20/2020 0411   HGB 10.2 (L) 12/20/2020 0411   HCT 32.9 (L) 12/20/2020 0411   PLT 301 12/20/2020 0411   MCV 83.5 12/20/2020 0411   MCH 25.9 (L)  12/20/2020 0411   MCHC 31.0 12/20/2020 0411   RDW 18.0 (H) 12/20/2020 0411   LYMPHSABS 1.1 12/18/2020 0052   MONOABS 0.7 12/18/2020 0052   EOSABS 0.0 12/18/2020 0052   BASOSABS 0.0 12/18/2020 0052   CMP Latest Ref Rng & Units 12/20/2020 12/19/2020 12/18/2020  Glucose 70 - 99 mg/dL 95 100(H) -  BUN 8 - 23 mg/dL 12 15 -  Creatinine 0.44 - 1.00 mg/dL 1.03(H) 1.17(H) 1.58(H)  Sodium 135 - 145 mmol/L 139 136 -  Potassium 3.5 - 5.1 mmol/L 4.7 4.8 -  Chloride 98 - 111 mmol/L 103 100 -  CO2 22 - 32 mmol/L 28 29 -  Calcium 8.9 - 10.3 mg/dL 9.2 9.2 -  Total Protein 6.5 - 8.1 g/dL 6.5 6.7 -  Total Bilirubin 0.3 - 1.2 mg/dL 0.5 0.5 -  Alkaline Phos 38 - 126 U/L 71 67 -  AST 15 - 41 U/L 40 43(H) -  ALT 0 - 44 U/L 44 47(H) -     Radiology Studies: No results found.   Scheduled Meds: . enoxaparin (LOVENOX) injection  40 mg Subcutaneous Q24H  . ferrous sulfate  325 mg Oral BID WC  . loratadine  10 mg Oral Daily  . magnesium oxide  400 mg Oral BID  . multivitamin with minerals  1 tablet Oral Daily  . pantoprazole  40 mg Oral Daily  . psyllium   Oral Daily   Continuous Infusions:   LOS: 1 day   Time spent: [place], MD [facility] To contact the attending provider between 7A-7P or the covering provider during after hours 7P-7A, please log into the web site [email or web address] and access using universal [place] password for that web site. If you do not have the password, please call the hospital operator.  12/20/2020, 4:33 PM

## 2020-12-21 ENCOUNTER — Encounter: Payer: Self-pay | Admitting: Family Medicine

## 2020-12-21 DIAGNOSIS — N289 Disorder of kidney and ureter, unspecified: Secondary | ICD-10-CM | POA: Diagnosis not present

## 2020-12-21 LAB — CBC WITH DIFFERENTIAL/PLATELET
Abs Immature Granulocytes: 0 10*3/uL (ref 0.00–0.07)
Basophils Absolute: 0.1 10*3/uL (ref 0.0–0.1)
Basophils Relative: 1 %
Eosinophils Absolute: 0.1 10*3/uL (ref 0.0–0.5)
Eosinophils Relative: 2 %
HCT: 32.7 % — ABNORMAL LOW (ref 36.0–46.0)
Hemoglobin: 10 g/dL — ABNORMAL LOW (ref 12.0–15.0)
Immature Granulocytes: 0 %
Lymphocytes Relative: 49 %
Lymphs Abs: 1.8 10*3/uL (ref 0.7–4.0)
MCH: 25.4 pg — ABNORMAL LOW (ref 26.0–34.0)
MCHC: 30.6 g/dL (ref 30.0–36.0)
MCV: 83 fL (ref 80.0–100.0)
Monocytes Absolute: 0.4 10*3/uL (ref 0.1–1.0)
Monocytes Relative: 11 %
Neutro Abs: 1.4 10*3/uL — ABNORMAL LOW (ref 1.7–7.7)
Neutrophils Relative %: 37 %
Platelets: 313 10*3/uL (ref 150–400)
RBC: 3.94 MIL/uL (ref 3.87–5.11)
RDW: 17.6 % — ABNORMAL HIGH (ref 11.5–15.5)
WBC: 3.8 10*3/uL — ABNORMAL LOW (ref 4.0–10.5)
nRBC: 0 % (ref 0.0–0.2)

## 2020-12-21 LAB — COMPREHENSIVE METABOLIC PANEL
ALT: 40 U/L (ref 0–44)
AST: 31 U/L (ref 15–41)
Albumin: 3.7 g/dL (ref 3.5–5.0)
Alkaline Phosphatase: 71 U/L (ref 38–126)
Anion gap: 9 (ref 5–15)
BUN: 16 mg/dL (ref 8–23)
CO2: 27 mmol/L (ref 22–32)
Calcium: 9.3 mg/dL (ref 8.9–10.3)
Chloride: 102 mmol/L (ref 98–111)
Creatinine, Ser: 1.15 mg/dL — ABNORMAL HIGH (ref 0.44–1.00)
GFR, Estimated: 53 mL/min — ABNORMAL LOW (ref >60)
Glucose, Bld: 91 mg/dL (ref 70–99)
Potassium: 4.7 mmol/L (ref 3.5–5.1)
Sodium: 138 mmol/L (ref 135–145)
Total Bilirubin: 0.4 mg/dL (ref 0.3–1.2)
Total Protein: 6.7 g/dL (ref 6.5–8.1)

## 2020-12-21 LAB — CK: Total CK: 121 U/L (ref 38–234)

## 2020-12-21 MED ORDER — FERROUS SULFATE 325 (65 FE) MG PO TABS
325.0000 mg | ORAL_TABLET | Freq: Two times a day (BID) | ORAL | 1 refills | Status: AC
Start: 2020-12-21 — End: ?

## 2020-12-21 NOTE — Discharge Summary (Signed)
Physician Discharge Summary  Nancy Tran NGE:[id number] DOB: [birth year] DOA: 12/17/2020  PCP: Nancy Bellows, PA-C  Admit date: 12/17/2020 Discharge date: 12/21/2020  Time spent: 37 minutes  Recommendations for Outpatient Follow-up:  1. Needs Chem-12 CBC in about 1 week 2. Note discontinuation of losartan, HCTZ and various other medications this admission 3. Might require outpatient work-up of mild leukopenia  Discharge Diagnoses:  MAIN problem for hospitalization   Nontraumatic rhabdomyolysis with AKI from prior to admission  Please see below for itemized issues addressed in [place]- refer to other progress notes for clarity if needed  Discharge Condition: Improved  Diet recommendation: Heart healthy  Filed Weights   12/17/20 2324  Weight: 86.2 kg    History of present illness:  Brief Narrative:  65 year old F Known HTN, reflux, iron deficiency anemia, hyperlipidemia Recent EGD 12/12/2020 by Dr. Mirian Tran Le-at [facility] esophagus appeared normal irregular Z-line with normal mucosa [?  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  Intestinal AVMs Recent EGD performed at [facility] showed no pathology She knows to follow-up with [name] Comment of [facility] gastroenterology and has already been called by the office for sooner work-up of the same We have increased her iron to twice daily at their request  Unexplained leukopenia Recheck labs outpatient setting-further work-up can be performed in the outpatient setting   Discharge Exam: Vitals:   12/20/20 2047 12/21/20 0540  BP: 124/70 128/75  Pulse: 66 (!) 57  Resp: 16 17  Temp: 98.5 F (36.9 C) 98 F (36.7 C)  SpO2: 100% 100%    Subj on day of d/c   Doing well ambulatory no distress eating drinking No chest pain although some scratching No fever no chills  General Exam on discharge  EOMI NCAT no scleral icterus no pallor Abdomen soft no rebound no guarding No lower extremity edema Neurologically intact no focal deficit S1-S2 no murmur no rub no gallop   Discharge Instructions   Discharge Instructions    Diet - low sodium heart healthy   Complete by: As directed    Discharge instructions   Complete by: As directed    Make sure that you look at your meds carefully as several have changed and we have discontinued your blood pressure meds as well as your statin medication Your primary care physician will be notified that you were admitted and will probably talk to you about other medications for blood pressure  I have increased your iron tablets to twice daily but you may require further work-up of some of your labs as directed by  [name] Take care of yourself, hydrate yourself and drink lots of water and we will write a note for you to excuse you from work for about 10 days time   Increase activity slowly   Complete by: As directed      Allergies as of 12/21/2020      Reactions   Codeine    Darvocet  [propoxyphene N-acetaminophen]    Lisinopril Other (See Comments)   Macrobid [nitrofurantoin Macrocrystal] Itching   Morphine And Related Nausea And Vomiting   Percocet [oxycodone-acetaminophen]    Amoxicillin-pot Clavulanate Rash   Other reaction(s): RASH Other reaction(s): RASH   Penicillins Rash      Medication List    STOP taking these medications   ferrous sulfate 325 (65 FE) MG EC tablet Replaced by: ferrous sulfate 325 (65 FE) MG tablet   hydrochlorothiazide 12.5 MG capsule Commonly known as: MICROZIDE   lidocaine 5 % Commonly known as: LIDODERM   loratadine 10 MG tablet Commonly known as: CLARITIN   losartan 100 MG tablet Commonly known as: COZAAR   meclizine 25 MG tablet Commonly known as: ANTIVERT   Metamucil Fiber Chew     TAKE these medications   baclofen 10 MG tablet Commonly known as: LIORESAL Take 5-10 mg by mouth every 8 (eight) hours as needed (pain).   ferrous sulfate 325 (65 FE) MG tablet Take 1 tablet (325 mg total) by mouth 2 (two) times daily with a meal. Replaces: ferrous sulfate 325 (65 FE) MG EC tablet   multivitamin with minerals Tabs tablet Take 1 tablet by mouth daily.   omeprazole 40 MG capsule Commonly known as: PRILOSEC Take 40 mg by mouth daily.      Allergies  Allergen Reactions  . Codeine   . Darvocet [Propoxyphene N-Acetaminophen]   . Lisinopril Other (See Comments)  . Macrobid [Nitrofurantoin Macrocrystal] Itching  . Morphine And Related Nausea And Vomiting  . Percocet [Oxycodone-Acetaminophen]   . Amoxicillin-Pot Clavulanate Rash    Other reaction(s): RASH Other reaction(s): RASH   . Penicillins Rash    Follow-up Information    Schedule an appointment as soon as possible for a visit  with [place].   Contact information: [place] [address] [phone]        Nancy Bellows, PA-C. Call today.   Specialty: Physician Assistant Contact information: [address] Kristeen Mans  [place] [address] [phone]                The results of significant diagnostics from this hospitalization (including imaging, microbiology, ancillary and laboratory) are listed below for reference.    Significant Diagnostic Studies: CT Head Wo Contrast  Result Date: 12/18/2020 CLINICAL DATA:  Maxillofacial pain EXAM: CT HEAD WITHOUT CONTRAST TECHNIQUE: Contiguous axial images were obtained from the base of the skull through the vertex without intravenous contrast. COMPARISON:  August 11, 2020 FINDINGS: Brain: No evidence of acute infarction, hemorrhage, hydrocephalus, extra-axial collection or mass lesion/mass effect. Vascular: No hyperdense vessel or unexpected calcification. Skull: Normal. Negative for fracture or focal lesion. Sinuses/Orbits: No acute finding. Other: None. IMPRESSION: No acute intracranial pathology. Electronically Signed   By: Virgina Norfolk M.D.   On: 12/18/2020 02:23   US RENAL  Result Date: 12/18/2020 CLINICAL DATA:  AKI EXAM: RENAL / URINARY TRACT ULTRASOUND COMPLETE COMPARISON:  None. FINDINGS: Right Kidney: Renal measurements: 9.5 x 3.7 x 4.6 cm = volume: 84 mL. Echogenicity within normal limits. No mass or hydronephrosis visualized. Left Kidney: Renal  measurements: 9.7 x 4.2 x 4.8 cm = volume: 62.7 mL. Echogenicity within normal limits. No mass or hydronephrosis visualized. Bladder: Appears normal for degree of bladder distention. Other: Ascites. IMPRESSION: No hydronephrosis. Ascites. Electronically Signed   By: Macy Mis M.D.   On: 12/18/2020 13:30    Microbiology: Recent Results (from the past 240 hour(s))  Resp Panel by RT-PCR (Flu A&B, Covid) Nasopharyngeal Swab     Status: None   Collection Time: 12/18/20  3:56 AM   Specimen: Nasopharyngeal Swab; Nasopharyngeal(NP) swabs in vial transport medium  Result Value Ref Range Status   SARS Coronavirus 2 by RT PCR NEGATIVE NEGATIVE Final    Comment: (NOTE) SARS-CoV-2 target nucleic acids  are NOT DETECTED.  The SARS-CoV-2 RNA is generally detectable in upper respiratory specimens during the acute phase of infection. The lowest concentration of SARS-CoV-2 viral copies this assay can detect is 138 copies/mL. A negative result does not preclude SARS-Cov-2 infection and should not be used as the sole basis for treatment or other patient management decisions. A negative result may occur with  improper specimen collection/handling, submission of specimen other than nasopharyngeal swab, presence of viral mutation(s) within the areas targeted by this assay, and inadequate number of viral copies(<138 copies/mL). A negative result must be combined with clinical observations, patient history, and epidemiological information. The expected result is Negative.  Fact Sheet for Patients:  EntrepreneurPulse.com.au  Fact Sheet for Healthcare Providers:  IncredibleEmployment.be  This test is no t yet approved or cleared by the [place] FDA and  has been authorized for detection and/or diagnosis of SARS-CoV-2 by FDA under an Emergency Use Authorization (EUA). This EUA will remain  in effect (meaning this test can be used) for the duration of the COVID-19 declaration under Section 564(b)(1) of the Act, 21 U.S.C.section 360bbb-3(b)(1), unless the authorization is terminated  or revoked sooner.       Influenza A by PCR NEGATIVE NEGATIVE Final   Influenza B by PCR NEGATIVE NEGATIVE Final    Comment: (NOTE) The Xpert Xpress SARS-CoV-2/FLU/RSV plus assay is intended as an aid in the diagnosis of influenza from Nasopharyngeal swab specimens and should not be used as a sole basis for treatment. Nasal washings and aspirates are unacceptable for Xpert Xpress SARS-CoV-2/FLU/RSV testing.  Fact Sheet for Patients: EntrepreneurPulse.com.au  Fact Sheet for Healthcare Providers: IncredibleEmployment.be  This test is  not yet approved or cleared by the [place] FDA and has been authorized for detection and/or diagnosis of SARS-CoV-2 by FDA under an Emergency Use Authorization (EUA). This EUA will remain in effect (meaning this test can be used) for the duration of the COVID-19 declaration under Section 564(b)(1) of the Act, 21 U.S.C. section 360bbb-3(b)(1), unless the authorization is terminated or revoked.  Performed at [facility], [address] [place], [place] [address]      Labs: Basic Metabolic Panel: Recent Labs  Lab 12/15/20 0200 12/18/20 0052 12/18/20 1135 12/19/20 0358 12/20/20 0411 12/21/20 0408  NA 128* 129*  --  136 139 138  K 4.8 4.8  --  4.8 4.7 4.7  CL 96* 96*  --  100 103 102  CO2 23 23  --  29 28 27   GLUCOSE 102* 98  --  100* 95 91  BUN 37* 27*  --  15 12 16   CREATININE 1.88* 2.07* 1.58* 1.17* 1.03* 1.15*  CALCIUM 9.3 9.7  --  9.2 9.2 9.3  MG  --  1.6*  --   --  1.9  --   PHOS  --   --   --   --  3.7  --    Liver Function Tests: Recent Labs  Lab 12/18/20 0052 12/19/20 0358 12/20/20 0411 12/21/20 0408  AST 51* 43* 40 31  ALT 46* 47* 44 40  ALKPHOS 76 67 71 71  BILITOT 0.2* 0.5 0.5 0.4  PROT 7.7 6.7 6.5 6.7  ALBUMIN 4.3 3.8 3.6 3.7   No results for input(s): LIPASE, AMYLASE in the last 168 hours. No results for input(s): AMMONIA in the last 168 hours. CBC: Recent Labs  Lab 12/15/20 0200 12/18/20 0052 12/18/20 1135 12/19/20 0358 12/20/20 0411 12/21/20 0408  WBC 4.7 3.5* 2.4* 2.8* 2.7* 3.8*  NEUTROABS 2.8 1.7  --   --   --  1.4*  HGB 10.1* 10.4* 11.8* 10.0* 10.2* 10.0*  HCT 31.4* 32.9* 37.4 32.1* 32.9* 32.7*  MCV 79.1* 81.0 81.1 82.3 83.5 83.0  PLT 315 357 326 296 301 313   Cardiac Enzymes: Recent Labs  Lab 12/18/20 0052 12/18/20 1135 12/19/20 0358 12/20/20 0411 12/21/20 0408  CKTOTAL 545* 456* 263* 158 121   BNP: BNP (last 3 results) No results for input(s): BNP in the last 8760 hours.  ProBNP (last 3 results) No  results for input(s): PROBNP in the last 8760 hours.  CBG: No results for input(s): GLUCAP in the last 168 hours.     Signed:  Nita Sells MD   [facility] 12/21/2020, 10:29 AM

## 2020-12-21 NOTE — Plan of Care (Signed)

## 2021-01-04 ENCOUNTER — Other Ambulatory Visit: Payer: Self-pay | Admitting: Nurse Practitioner

## 2021-01-04 DIAGNOSIS — R5381 Other malaise: Secondary | ICD-10-CM

## 2021-05-05 IMAGING — NM NM GI BLOOD LOSS
2 series · 12 of 12 positions shown · non-contrast
Comparison: None.

CLINICAL DATA: GI bleed.  Patient on blood thinners.  Bloody BM.

EXAM:
NUCLEAR MEDICINE GASTROINTESTINAL BLEEDING SCAN
TECHNIQUE: Sequential abdominal images were obtained following intravenous
administration of Mc-88m labeled red blood cells.
RADIOPHARMACEUTICALS:  22.8 mCi Mc-88m pertechnetate in-vitro
labeled red cells.

[Series 1: gi bleed · 4.14mm/px · 6 of 60 frames shown (1 of 2)]
[frame 6/60]
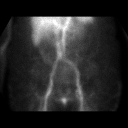
[frame 16/60]
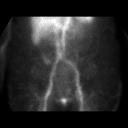
[frame 26/60]
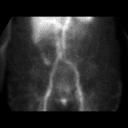
[frame 36/60]
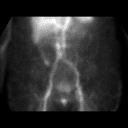
[frame 46/60]
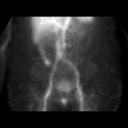
[frame 56/60]
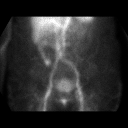

[Series 2: gi bleed · 4.14mm/px · 6 of 60 frames shown (2 of 2)]
[frame 6/60]
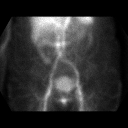
[frame 16/60]
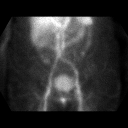
[frame 26/60]
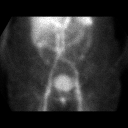
[frame 36/60]
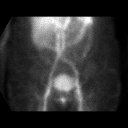
[frame 46/60]
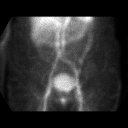
[frame 56/60]
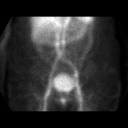

[12 of 12 positions shown; findings below may reference images not displayed]

FINDINGS: During the first hour of imaging there is a focal area of increased
radiotracer uptake localizing to the right colon. Over time, this
area increases in intensity. On the second hour there is antegrade
progression of the radiopharmaceutical conforming to the expected
course of the ascending colon, transverse colon and descending
colon. Radiotracer activity extends to the distal descending colon.
IMPRESSION: 1. Examination is positive for active GI bleed. The source of the
bleed is felt to be proximal right colon.
2. Critical Value/emergent results were called by telephone at the
time of interpretation on 01/25/2020 at [DATE] to provider SHADESH
MARQ , who verbally acknowledged these results.

## 2021-05-05 IMAGING — US IR US GUIDE VASC ACCESS RIGHT
1 series · 2 of 2 positions shown · non-contrast
Comparison: none

INDICATION: 64-year-old female status post treatment of intracranial aneurysms
with flow diverting (pipeline) stents at the beginning of the month.
She has been on Brilinta and aspirin since that time and cannot, the
dual antiplatelet therapy for fear of thrombosis of the recently
placed flow diverting stents which could result in a catastrophic
stroke.

[Series 1: (id) · 2 of 2 slices shown]
[im 1/2]
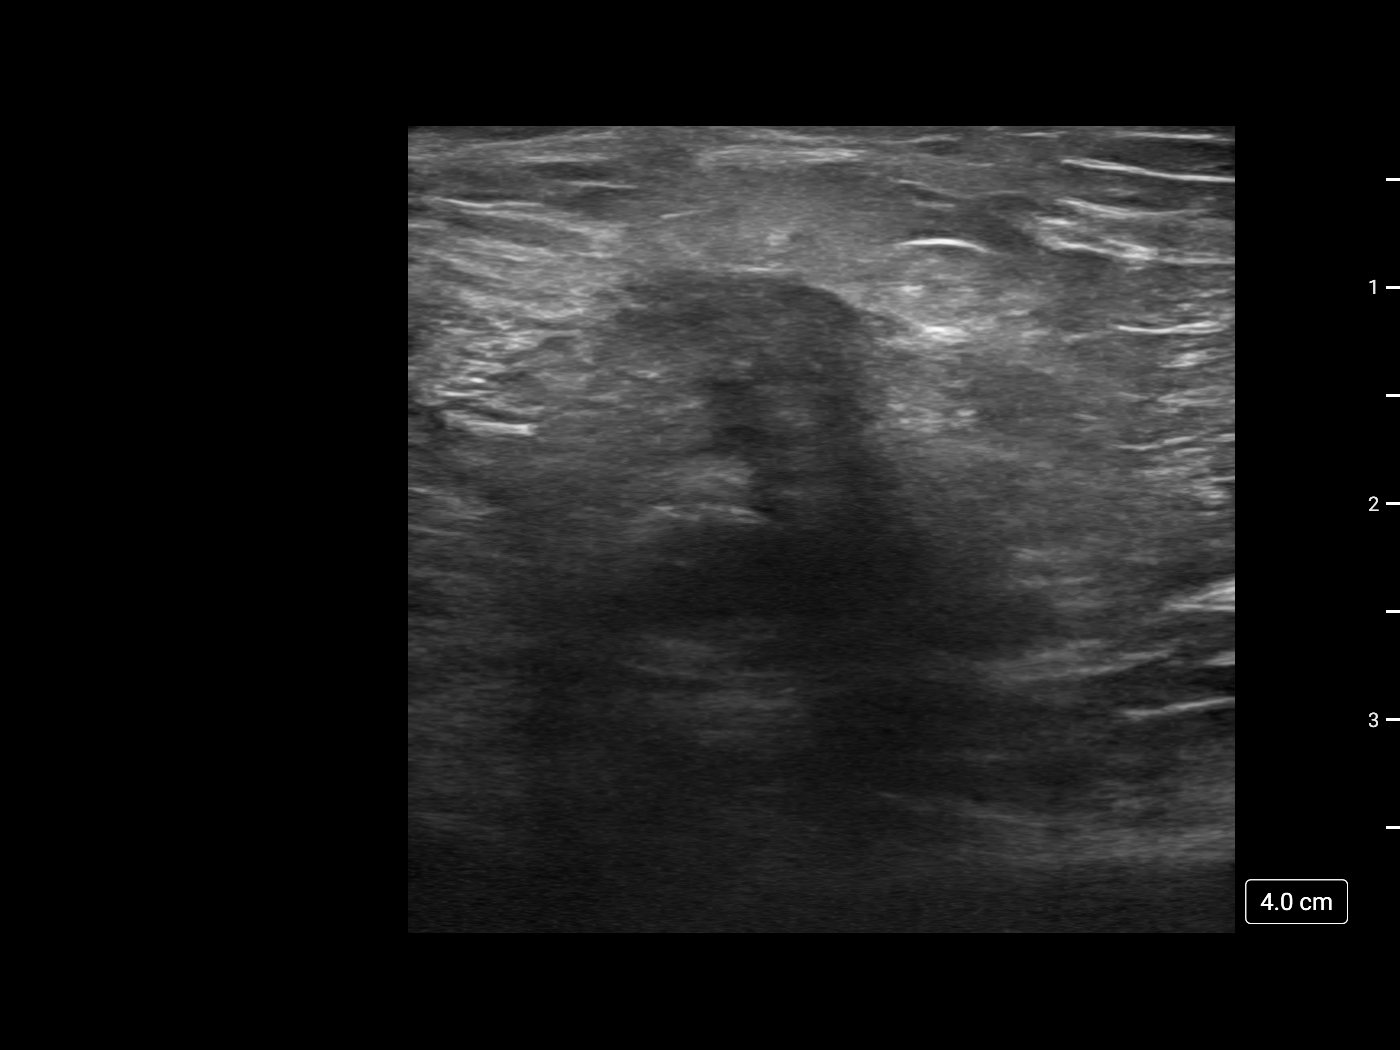
[im 2/2]
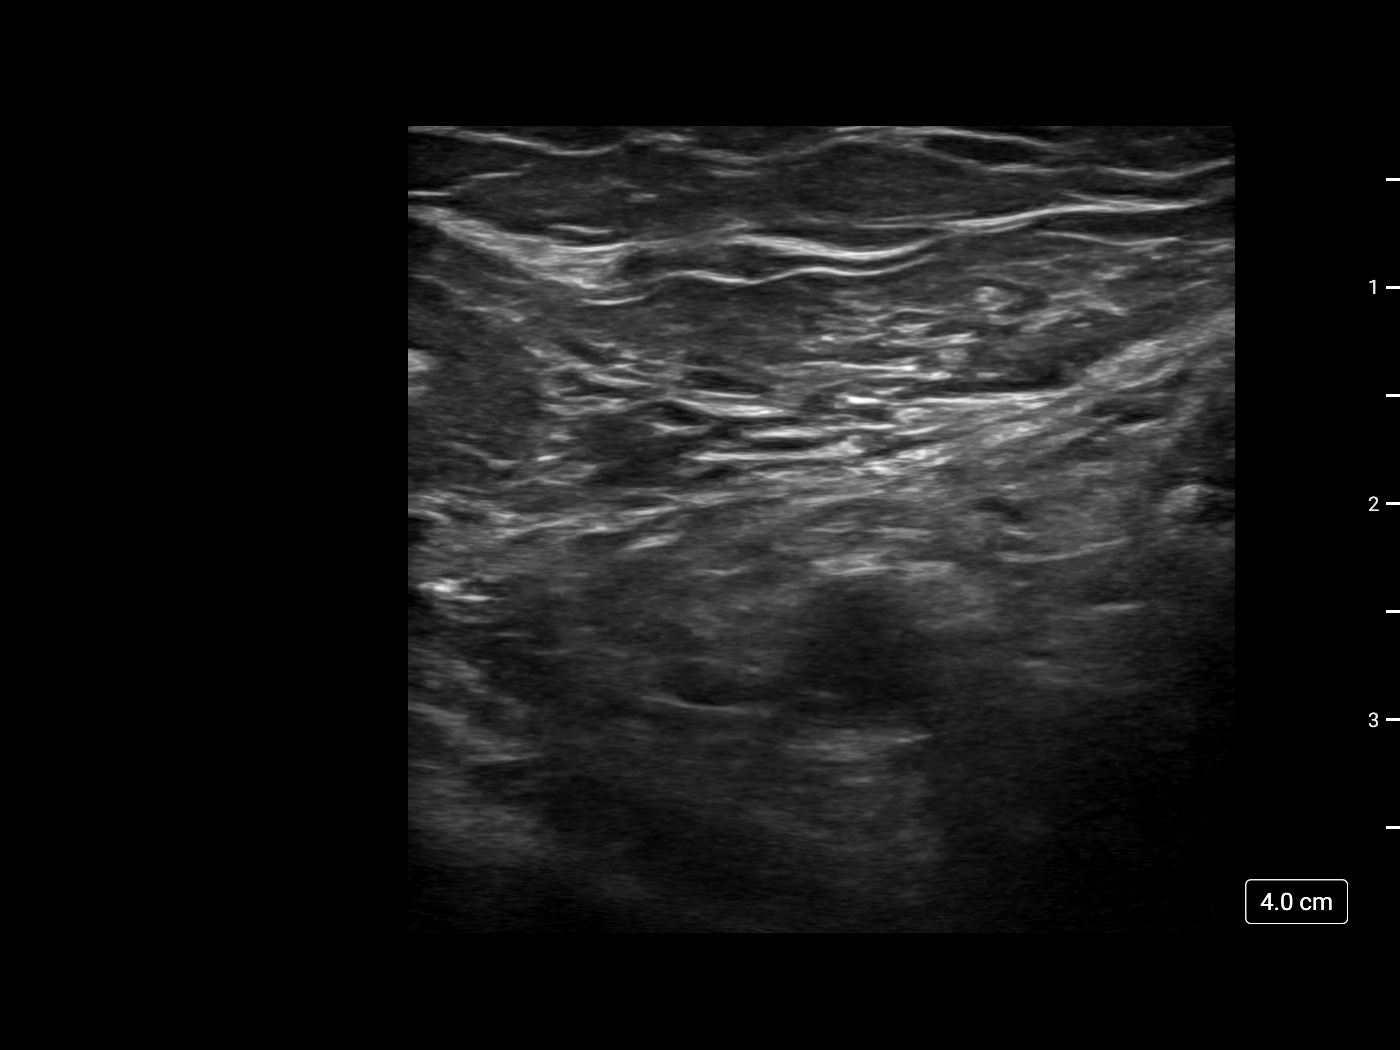

[2 of 2 positions shown; findings below may reference images not displayed]

She was recently diagnosed with ascending diverticulitis, was
treated and discharged on 01/18/2020. She now presents with lower GI
bleeding and acute on chronic anemia requiring transfusion. Nuclear
medicine tagged red blood cell bleeding study localizes the bleeding
to the ascending colon. She now presents for angiography and
possible embolization.

EXAM:
IR ULTRASOUND GUIDANCE VASC ACCESS RIGHT; SELECTIVE VISCERAL
ARTERIOGRAPHY; ADDITIONAL ARTERIOGRAPHY

1. Ultrasound-guided vascular access left common femoral artery
2. Catheterization of the celiac artery with arteriogram
3. Catheterization the superior mesenteric artery with arteriogram
4. Catheterization of the middle colic artery with arteriogram
5. Catheterization of the right colic artery with arteriogram
6. Catheterization of ileal branch with arteriogram

MEDICATIONS:
None

ANESTHESIA/SEDATION:
Moderate (conscious) sedation was employed during this procedure. A
total of Versed 5 mg and Fentanyl 150 mcg was administered
intravenously.

Moderate Sedation Time: 49 minutes. The patient's level of
consciousness and vital signs were monitored continuously by
radiology nursing throughout the procedure under my direct
supervision.

CONTRAST:  60mL OMNIPAQUE IOHEXOL 300 MG/ML SOLN, 20mL OMNIPAQUE
IOHEXOL 300 MG/ML SOLN, 20mL OMNIPAQUE IOHEXOL 300 MG/ML SOLN

FLUOROSCOPY TIME:  Fluoroscopy Time: 6 minutes 48 seconds (7161
mGy).

COMPLICATIONS:
None immediate.



The right groin was interrogated with ultrasound. The common femoral
artery is patent. There is a metallic clip anterior to the common
femoral artery as well as extensive thickening of the soft tissues
over the common femoral artery. An attempt was made to puncture the
artery, however the inflammatory changes are extremely scirrhous and
it was difficult to pass the needle. Therefore, the decision was
made to proceed with a left common femoral access.

The left common femoral artery was interrogated with ultrasound and
found to be widely patent. An image was obtained and stored for the
medical record. Local anesthesia was attained by infiltration with
1% lidocaine. A small dermatotomy was made. Under real-time
sonographic guidance, the vessel was punctured with a 21 gauge
micropuncture needle. Using standard technique, the initial micro
needle was exchanged over a 0.018 micro wire for a transitional 4
French micro sheath. The micro sheath was then exchanged over a
0.035 wire for a 5 French vascular sheath.

A C2 cobra catheter was advanced over a Bentson wire into the
abdominal aorta. The catheter was used to select the celiac axis. A
celiac arteriogram was performed. Conventional celiac artery
anatomy. No evidence of replaced middle colic artery. No evidence of
active bleeding.

The C2 cobra catheter was then used to select the superior
mesenteric artery. Arteriography was performed. There appears to be
a focus of blush or hyperemia in the right hemiabdomen in the region
of the ascending colon. The decision was made to pursue more
selective catheterization. Therefore, a renegade STC microcatheter
was advanced over a Fathom 16 wire.

An initial branch was selected and arteriography performed. This is
an ileal branch artery. No evidence of active bleeding. The
microcatheter was brought back into the main SMA. The next branch
selected was the middle colic artery. Arteriography was performed
with excellent opacification of the hepatic flexure of the. No
evidence of vascular abnormality or active bleeding.

The next artery selected was the right colic artery. Arteriography
was performed. There is a several cm segment of hyperemia in the mid
ascending colon. However, there is no evidence of active bleeding or
arteriovenous malformation. Microcatheter was advanced more distally
in the right colic artery an additional magnified arteriography was
performed confirming regional hyperemia without evidence of active
hemorrhage.

At this point, mild Gel-Foam embolization of the region was
considered. However, this is a fairly large region of the colon and
such embolization could result in ischemic colitis or delay healing
of the underlying colitis. Given that there is no active bleeding at
this time and that the patient is hemodynamically stable, no
embolization was performed.

The catheters were removed. Hemostasis was attained with the
assistance of a 6 French Angio-Seal device. The patient tolerated
the procedure well.
IMPRESSION: 1. No active bleeding at the time of arteriography.
2. There is a several cm segment of regional hyperemia in the mid
ascending colon which corresponds with the site of active colitis on
the prior CT scan. This is almost certainly the source of the
patient's bleeding and either represents recurrent active colitis,
or potentially an underlying colonic neoplasm.

PLAN:
1. Embolization was not performed at this time as embolization of
such a large region of hyperemia could result in ischemic colitis
and further complication. Regional embolization could be considered
if the patient has a life-threatening bleeding event or becomes
otherwise unstable.
2. Recommend treatment for active colitis to be followed by
colonoscopy to exclude an underlying neoplastic process in the
region.
3. Continue to trend H and H and transfuse as needed. Bleeding
should cease once the region of active colitis resolves.

## 2021-08-22 ENCOUNTER — Other Ambulatory Visit: Payer: Self-pay | Admitting: Nurse Practitioner

## 2021-08-22 DIAGNOSIS — Z13820 Encounter for screening for osteoporosis: Secondary | ICD-10-CM

## 2021-08-23 ENCOUNTER — Ambulatory Visit
Admission: RE | Admit: 2021-08-23 | Discharge: 2021-08-23 | Disposition: A | Discharge status: Home / Self Care | Payer: Medicare Other | Source: Ambulatory Visit | Attending: Nurse Practitioner | Admitting: Nurse Practitioner

## 2021-08-23 DIAGNOSIS — Z13820 Encounter for screening for osteoporosis: Secondary | ICD-10-CM

## 2021-09-21 ENCOUNTER — Emergency Department (HOSPITAL_BASED_OUTPATIENT_CLINIC_OR_DEPARTMENT_OTHER)
Admission: EM | Admit: 2021-09-21 | Discharge: 2021-09-21 | Disposition: A | Discharge status: Home / Self Care | Payer: Medicare Other | Attending: Emergency Medicine | Admitting: Emergency Medicine

## 2021-09-21 ENCOUNTER — Other Ambulatory Visit: Payer: Self-pay

## 2021-09-21 ENCOUNTER — Emergency Department (HOSPITAL_BASED_OUTPATIENT_CLINIC_OR_DEPARTMENT_OTHER): Payer: Medicare Other

## 2021-09-21 DIAGNOSIS — R079 Chest pain, unspecified: Secondary | ICD-10-CM

## 2021-09-21 DIAGNOSIS — Z87891 Personal history of nicotine dependence: Secondary | ICD-10-CM | POA: Insufficient documentation

## 2021-09-21 DIAGNOSIS — I10 Essential (primary) hypertension: Secondary | ICD-10-CM | POA: Insufficient documentation

## 2021-09-21 DIAGNOSIS — R0789 Other chest pain: Secondary | ICD-10-CM | POA: Insufficient documentation

## 2021-09-21 LAB — CBC
HCT: 37.2 % (ref 36.0–46.0)
Hemoglobin: 11.9 g/dL — ABNORMAL LOW (ref 12.0–15.0)
MCH: 27.2 pg (ref 26.0–34.0)
MCHC: 32 g/dL (ref 30.0–36.0)
MCV: 85.1 fL (ref 80.0–100.0)
Platelets: 319 10*3/uL (ref 150–400)
RBC: 4.37 MIL/uL (ref 3.87–5.11)
RDW: 14.9 % (ref 11.5–15.5)
WBC: 4.3 10*3/uL (ref 4.0–10.5)
nRBC: 0 % (ref 0.0–0.2)

## 2021-09-21 LAB — TROPONIN I (HIGH SENSITIVITY)
Troponin I (High Sensitivity): 4 ng/L (ref <18)
Troponin I (High Sensitivity): 3 ng/L (ref <18)

## 2021-09-21 LAB — BASIC METABOLIC PANEL
Anion gap: 8 (ref 5–15)
BUN: 11 mg/dL (ref 8–23)
CO2: 29 mmol/L (ref 22–32)
Calcium: 9.9 mg/dL (ref 8.9–10.3)
Chloride: 101 mmol/L (ref 98–111)
Creatinine, Ser: 0.95 mg/dL (ref 0.44–1.00)
GFR, Estimated: >60 mL/min (ref >60)
Glucose, Bld: 84 mg/dL (ref 70–99)
Potassium: 4.3 mmol/L (ref 3.5–5.1)
Sodium: 138 mmol/L (ref 135–145)

## 2021-09-21 NOTE — Discharge Instructions (Signed)
Please follow-up with your primary care doctor and your gastroenterologist regarding your symptoms today.  Come back to ER if you develop worsening chest pain, difficulty breathing or other new concerning symptom.

## 2021-09-21 NOTE — ED Provider Notes (Signed)
[facility] EMERGENCY DEPARTMENT Provider Note   CSN: [id number] Arrival date & time: 09/21/21  1146     History No chief complaint on file.   Nancy Tran is a 65 y.o. female.  Presenting to ER with concern for chest pain.  Says that she has been having symptoms over the past few days.  Thought that it was initially related to her gastritis, gastric reflux disease however when she contacted her GI office she was recommended to come to ER for further management.  Pain seems to be worse in the mornings, eases off throughout the day.  A few days ago does recall having some spicy foods that she thinks may have been a trigger.  No other significant pattern noticed.  Not associated with exertion.  Pain is described as burning discomfort in chest.  Currently does not have any pain.  No shortness of breath.  HPI     Past Medical History:  Diagnosis Date   Adenoma of colon 2007   Adenomatous polyps in 2007, 2012, 2017.  GI MD is Tri Le in [place].     Complication of anesthesia    Diverticulitis 12/2019   Gastritis, erosive 08/2018   EGD by Dr Mitchell Heir.     High cholesterol    History of cerebral aneurysm    bil internal carotid artery aneurysms.   embollization and stent to left 12/29/19 at [facility].     Hypertension    Normocytic anemia 2016   PONV (postoperative nausea and vomiting)    Tubal ligation status     Patient Active Problem List   Diagnosis Date Noted   ARF (acute renal failure) ([place]) 12/18/2020   Dizziness 03/12/2020   Acute lower GI bleeding 01/24/2020   Syncope 01/24/2020   GERD (gastroesophageal reflux disease) 01/24/2020   Diverticulosis 01/24/2020   Hypokalemia 01/10/2020   Occult GI bleeding 01/10/2020   AKI (acute kidney injury) ([place]) 01/10/2020   Hypomagnesemia 01/10/2020   Acute diverticulitis 01/09/2020   Cerebral aneurysm 12/09/2019   Family history of brain aneurysm 11/07/2019   GAD (generalized anxiety disorder) 10/28/2019    Tension-type headache, not intractable 10/28/2019   Mild renal insufficiency 10/14/2019   Leg swelling 07/20/2019   Normochromic anemia 04/12/2019   Gastritis determined by endoscopy 11/02/2018   Cervical cancer screening 10/12/2018   Neutropenia ([place]) 07/28/2018   History of colonic polyps 06/24/2018   Odynophagia 06/10/2018   Thyroid nodule 06/10/2018   Prediabetes 05/26/2015   Osteoarthritis of knee 11/07/2014   Essential hypertension 09/03/2014   Hyperlipidemia LDL goal <130 10/26/2013   Hidradenitis 06/10/2012   Tubal ligation status     Past Surgical History:  Procedure Laterality Date   BIOPSY  01/27/2020   Procedure: BIOPSY;  Surgeon: Gatha Mayer, MD;  Location: [facility];  Service: Endoscopy;;   BREAST SURGERY     CEREBRAL ANEURYSM REPAIR  12/29/2019   COLONOSCOPY     COLONOSCOPY WITH PROPOFOL N/A 01/27/2020   Procedure: COLONOSCOPY WITH PROPOFOL;  Surgeon: Gatha Mayer, MD;  Location: [facility];  Service: Endoscopy;  Laterality: N/A;   IR ANGIOGRAM SELECTIVE EACH ADDITIONAL VESSEL  01/25/2020   IR ANGIOGRAM SELECTIVE EACH ADDITIONAL VESSEL  01/25/2020   IR ANGIOGRAM VISCERAL SELECTIVE  01/25/2020   IR ANGIOGRAM VISCERAL SELECTIVE  01/25/2020   IR US GUIDE VASC ACCESS RIGHT  01/25/2020   TUBAL LIGATION       OB History   No obstetric history on file.  No family history on file.  Social History   Tobacco Use   Smoking status: Former   Smokeless tobacco: Never   Tobacco comments:    QUIT IN 1992  Vaping Use   Vaping Use: Never used  Substance Use Topics   Alcohol use: Not Currently   Drug use: No    Home Medications Prior to Admission medications   Medication Sig Start Date End Date Taking? Authorizing Provider  baclofen (LIORESAL) 10 MG tablet Take 5-10 mg by mouth every 8 (eight) hours as needed (pain). 11/10/19   [provider]  ferrous sulfate 325 (65 FE) MG tablet Take 1 tablet (325 mg total) by mouth 2 (two) times daily with a  meal. 12/21/20   Nita Sells, MD  Multiple Vitamin (MULTIVITAMIN WITH MINERALS) TABS tablet Take 1 tablet by mouth daily.    [provider]  omeprazole (PRILOSEC) 40 MG capsule Take 40 mg by mouth daily. 12/20/19   [provider]    Allergies    Codeine, Darvocet [propoxyphene n-acetaminophen], Lisinopril, Macrobid [nitrofurantoin macrocrystal], Morphine and related, Percocet [oxycodone-acetaminophen], Amoxicillin-pot clavulanate, and Penicillins  Review of Systems   Review of Systems  Constitutional:  Negative for chills and fever.  HENT:  Negative for ear pain and sore throat.   Eyes:  Negative for pain and visual disturbance.  Respiratory:  Positive for chest tightness. Negative for cough and shortness of breath.   Cardiovascular:  Positive for chest pain. Negative for palpitations.  Gastrointestinal:  Negative for abdominal pain and vomiting.  Genitourinary:  Negative for dysuria and hematuria.  Musculoskeletal:  Negative for arthralgias and back pain.  Skin:  Negative for color change and rash.  Neurological:  Negative for seizures and syncope.  All other systems reviewed and are negative.  Physical Exam Updated Vital Signs BP (!) 162/76 (BP Location: Right Arm)   Pulse (!) 55   Temp 98.4 F (36.9 C) (Oral)   Resp 18   Ht 5\' 7"  (1.702 m)   Wt 96.2 kg   SpO2 100%   BMI 33.20 kg/m   Physical Exam Vitals and nursing note reviewed.  Constitutional:      General: She is not in acute distress.    Appearance: She is well-developed.  HENT:     Head: Normocephalic and atraumatic.  Eyes:     Conjunctiva/sclera: Conjunctivae normal.  Cardiovascular:     Rate and Rhythm: Normal rate and regular rhythm.     Heart sounds: No murmur heard. Pulmonary:     Effort: Pulmonary effort is normal. No respiratory distress.     Breath sounds: Normal breath sounds.  Abdominal:     Palpations: Abdomen is soft.     Tenderness: There is no abdominal tenderness.   Musculoskeletal:        General: No swelling.     Cervical back: Neck supple.  Skin:    General: Skin is warm and dry.     Capillary Refill: Capillary refill takes less than 2 seconds.  Neurological:     General: No focal deficit present.     Mental Status: She is alert.     Cranial Nerves: No cranial nerve deficit.     Sensory: No sensory deficit.  Psychiatric:        Mood and Affect: Mood normal.    ED Results / Procedures / Treatments   Labs (all labs ordered are listed, but only abnormal results are displayed) Labs Reviewed  CBC - Abnormal; Notable for the following  components:      Result Value   Hemoglobin 11.9 (*)    All other components within normal limits  BASIC METABOLIC PANEL  TROPONIN I (HIGH SENSITIVITY)  TROPONIN I (HIGH SENSITIVITY)    EKG EKG Interpretation  Date/Time:  Friday September 21 2021 11:58:44 EST Ventricular Rate:  63 PR Interval:  174 QRS Duration: 84 QT Interval:  398 QTC Calculation: 407 R Axis:   59 Text Interpretation: Normal sinus rhythm Normal ECG Confirmed by Madalyn Rob [phone]) on 09/21/2021 2:54:29 PM  Radiology DG Chest 2 View  Result Date: 09/21/2021 CLINICAL DATA:  Chest pain EXAM: CHEST - 2 VIEW COMPARISON:  Chest radiograph 01/20/2012 FINDINGS: The cardiomediastinal silhouette is normal. There is no focal consolidation or pulmonary edema. There is no pleural effusion or pneumothorax. There is no acute osseous abnormality. IMPRESSION: No radiographic evidence of acute cardiopulmonary process. Electronically Signed   By: Valetta Mole M.D.   On: 09/21/2021 12:36    Procedures Procedures   Medications Ordered in ED Medications - No data to display  ED Course  I have reviewed the triage vital signs and the nursing notes.  Pertinent labs & imaging results that were available during my care of the patient were reviewed by me and considered in my medical decision making (see chart for details).    MDM  Rules/Calculators/A&P                          64 year old lady presents to ER with concern for chest pain.  On exam she appears well in no distress.  Vital signs are stable.  EKG without acute ischemic change and troponin x2 is within normal limits, doubt ACS.  CXR negative.  Remainder basic labs stable.  Given symptomatology, suspect symptoms may be more likely related to gastric reflux disease.  Recommend patient follow-up with her GI doctor and primary care doctor, reviewed return precautions and discharged.  After the discussed management above, the patient was determined to be safe for discharge.  The patient was in agreement with this plan and all questions regarding their care were answered.  ED return precautions were discussed and the patient will return to the ED with any significant worsening of condition.   Final Clinical Impression(s) / ED Diagnoses Final diagnoses:  Chest pain, unspecified type    Rx / DC Orders ED Discharge Orders     None        Lucrezia Starch, MD 09/22/21 [phone]

## 2021-09-21 NOTE — ED Notes (Signed)
Pt. Walked to restroom with no difficulty 

## 2021-09-21 NOTE — ED Triage Notes (Signed)
Pain in the center of her chest x 4 days. Pain wakes her in the mornings. Hx of GERD. She has had no relief with her GERD medications. EKG at triage.

## 2021-09-29 ENCOUNTER — Emergency Department (HOSPITAL_COMMUNITY): Payer: Medicare Other

## 2021-09-29 ENCOUNTER — Encounter (HOSPITAL_COMMUNITY): Payer: Self-pay

## 2021-09-29 ENCOUNTER — Emergency Department (HOSPITAL_COMMUNITY)
Admission: EM | Admit: 2021-09-29 | Discharge: 2021-09-29 | Disposition: A | Discharge status: Home / Self Care | Payer: Medicare Other | Attending: Student | Admitting: Student

## 2021-09-29 DIAGNOSIS — I10 Essential (primary) hypertension: Secondary | ICD-10-CM | POA: Diagnosis not present

## 2021-09-29 DIAGNOSIS — R42 Dizziness and giddiness: Secondary | ICD-10-CM | POA: Insufficient documentation

## 2021-09-29 DIAGNOSIS — Z87891 Personal history of nicotine dependence: Secondary | ICD-10-CM | POA: Insufficient documentation

## 2021-09-29 DIAGNOSIS — Z20822 Contact with and (suspected) exposure to covid-19: Secondary | ICD-10-CM | POA: Diagnosis not present

## 2021-09-29 LAB — COMPREHENSIVE METABOLIC PANEL
ALT: 43 U/L (ref 0–44)
AST: 46 U/L — ABNORMAL HIGH (ref 15–41)
Albumin: 3.6 g/dL (ref 3.5–5.0)
Alkaline Phosphatase: 85 U/L (ref 38–126)
Anion gap: 9 (ref 5–15)
BUN: 11 mg/dL (ref 8–23)
CO2: 25 mmol/L (ref 22–32)
Calcium: 9.6 mg/dL (ref 8.9–10.3)
Chloride: 105 mmol/L (ref 98–111)
Creatinine, Ser: 0.88 mg/dL (ref 0.44–1.00)
GFR, Estimated: >60 mL/min (ref >60)
Glucose, Bld: 86 mg/dL (ref 70–99)
Potassium: 5.4 mmol/L — ABNORMAL HIGH (ref 3.5–5.1)
Sodium: 139 mmol/L (ref 135–145)
Total Bilirubin: 1.3 mg/dL — ABNORMAL HIGH (ref 0.3–1.2)
Total Protein: 6.9 g/dL (ref 6.5–8.1)

## 2021-09-29 LAB — CBC WITH DIFFERENTIAL/PLATELET
Abs Immature Granulocytes: 0.02 10*3/uL (ref 0.00–0.07)
Basophils Absolute: 0 10*3/uL (ref 0.0–0.1)
Basophils Relative: 1 %
Eosinophils Absolute: 0.1 10*3/uL (ref 0.0–0.5)
Eosinophils Relative: 2 %
HCT: 37.1 % (ref 36.0–46.0)
Hemoglobin: 11.7 g/dL — ABNORMAL LOW (ref 12.0–15.0)
Immature Granulocytes: 1 %
Lymphocytes Relative: 29 %
Lymphs Abs: 1.2 10*3/uL (ref 0.7–4.0)
MCH: 27 pg (ref 26.0–34.0)
MCHC: 31.5 g/dL (ref 30.0–36.0)
MCV: 85.7 fL (ref 80.0–100.0)
Monocytes Absolute: 0.4 10*3/uL (ref 0.1–1.0)
Monocytes Relative: 9 %
Neutro Abs: 2.5 10*3/uL (ref 1.7–7.7)
Neutrophils Relative %: 58 %
Platelets: 337 10*3/uL (ref 150–400)
RBC: 4.33 MIL/uL (ref 3.87–5.11)
RDW: 14.8 % (ref 11.5–15.5)
WBC: 4.2 10*3/uL (ref 4.0–10.5)
nRBC: 0 % (ref 0.0–0.2)

## 2021-09-29 LAB — RESP PANEL BY RT-PCR (FLU A&B, COVID) ARPGX2
Influenza A by PCR: NEGATIVE
Influenza B by PCR: NEGATIVE
SARS Coronavirus 2 by RT PCR: NEGATIVE

## 2021-09-29 LAB — TROPONIN I (HIGH SENSITIVITY): Troponin I (High Sensitivity): 5 ng/L (ref <18)

## 2021-09-29 MED ORDER — LACTATED RINGERS IV BOLUS
1000.0000 mL | Freq: Once | INTRAVENOUS | Status: AC
  Administered 2021-09-29: 1000 mL via INTRAVENOUS

## 2021-09-29 MED ORDER — IOHEXOL 350 MG/ML SOLN
80.0000 mL | Freq: Once | INTRAVENOUS | Status: AC | PRN
  Administered 2021-09-29: 80 mL via INTRAVENOUS

## 2021-09-29 MED ORDER — LORAZEPAM 1 MG PO TABS
1.0000 mg | ORAL_TABLET | Freq: Once | ORAL | Status: AC
  Administered 2021-09-29: 1 mg via ORAL
  Filled 2021-09-29: qty 1

## 2021-09-29 NOTE — ED Notes (Signed)
ED Provider at bedside talking with patient and spouse.

## 2021-09-29 NOTE — ED Triage Notes (Signed)
[facility] reports pt coming home c/o dizziness that started today. BP 204/94 with EMS. CAOx4.

## 2021-09-29 NOTE — ED Notes (Addendum)
Unsuccessful difficult stick x 2 attempts for IV. IV Team ordered

## 2021-09-29 NOTE — ED Notes (Signed)
ED Provider at bedside. 

## 2021-09-29 NOTE — ED Triage Notes (Signed)
Dizziness on/off x 1 week. Hx aneurysm. BP 204/104 on arrival, last was 180/78. Compliant with BP meds, NSR , no pain. CBG 106.

## 2021-09-29 NOTE — ED Provider Notes (Signed)
[facility] EMERGENCY DEPARTMENT Provider Note   CSN: [id number] Arrival date & time: 09/29/21  3016     History Chief Complaint  Patient presents with   Dizziness    Nancy Tran is a 65 y.o. 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  Patient states that she awoke this morning, went from lying flat to standing up and had acute onset lightheadedness.  She denies vertiginous symptoms.  Denies numbness, tingling, weakness of the extremities.  She states that she has having difficulty walking due to lightheadedness.  She also endorses intermittent chest pain that is since resolved.  Denies abdominal pain, nausea, vomiting, cough, fever or other systemic symptoms.  Dizziness Associated symptoms: no chest pain, no palpitations, no shortness of breath and no vomiting       Past Medical History:  Diagnosis Date   Adenoma of colon 2007   Adenomatous polyps in 2007, 2012, 2017.  GI MD is Tri Le in [place].     Complication of anesthesia    Diverticulitis 12/2019   Gastritis, erosive 08/2018   EGD by Dr Mitchell Heir.     High cholesterol    History of cerebral aneurysm    bil internal carotid artery aneurysms.   embollization and stent to left 12/29/19 at [facility].     Hypertension    Normocytic anemia 2016   PONV (postoperative nausea and vomiting)    Tubal ligation status     Patient Active Problem List   Diagnosis Date Noted   ARF (acute renal failure) ([place]) 12/18/2020   Dizziness 03/12/2020   Acute lower GI bleeding 01/24/2020   Syncope 01/24/2020   GERD (gastroesophageal reflux disease) 01/24/2020   Diverticulosis 01/24/2020   Hypokalemia 01/10/2020   Occult GI bleeding 01/10/2020   AKI (acute kidney injury) ([place]) 01/10/2020   Hypomagnesemia 01/10/2020   Acute diverticulitis  01/09/2020   Cerebral aneurysm 12/09/2019   Family history of brain aneurysm 11/07/2019   GAD (generalized anxiety disorder) 10/28/2019   Tension-type headache, not intractable 10/28/2019   Mild renal insufficiency 10/14/2019   Leg swelling 07/20/2019   Normochromic anemia 04/12/2019   Gastritis determined by endoscopy 11/02/2018   Cervical cancer screening 10/12/2018   Neutropenia ([place]) 07/28/2018   History of colonic polyps 06/24/2018   Odynophagia 06/10/2018   Thyroid nodule 06/10/2018   Prediabetes 05/26/2015   Osteoarthritis of knee 11/07/2014   Essential hypertension 09/03/2014   Hyperlipidemia LDL goal <130 10/26/2013   Hidradenitis 06/10/2012   Tubal ligation status     Past Surgical History:  Procedure Laterality Date   BIOPSY  01/27/2020   Procedure: BIOPSY;  Surgeon: Gatha Mayer, MD;  [facility]: [facility];  Service: Endoscopy;;   BREAST SURGERY     CEREBRAL ANEURYSM REPAIR  12/29/2019   COLONOSCOPY     COLONOSCOPY WITH PROPOFOL N/A 01/27/2020   Procedure: COLONOSCOPY WITH PROPOFOL;  Surgeon: Gatha Mayer, MD;  Location: [facility];  Service: Endoscopy;  Laterality: N/A;   IR ANGIOGRAM SELECTIVE EACH ADDITIONAL VESSEL  01/25/2020   IR ANGIOGRAM SELECTIVE EACH ADDITIONAL VESSEL  01/25/2020   IR ANGIOGRAM VISCERAL SELECTIVE  01/25/2020   IR ANGIOGRAM VISCERAL SELECTIVE  01/25/2020   IR US GUIDE VASC ACCESS RIGHT  01/25/2020   TUBAL LIGATION       OB History   No obstetric history on file.  History reviewed. No pertinent family history.  Social History   Tobacco Use   Smoking status: Former   Smokeless tobacco: Never   Tobacco comments:    QUIT IN 1992  Vaping Use   Vaping Use: Never used  Substance Use Topics   Alcohol use: Not Currently   Drug use: No    Home Medications Prior to Admission medications   Medication Sig Start Date End Date Taking? Authorizing Provider  baclofen (LIORESAL) 10 MG tablet Take 5-10 mg by mouth every 8 (eight)  hours as needed (pain). 11/10/19   [provider]  ferrous sulfate 325 (65 FE) MG tablet Take 1 tablet (325 mg total) by mouth 2 (two) times daily with a meal. 12/21/20   Nita Sells, MD  Multiple Vitamin (MULTIVITAMIN WITH MINERALS) TABS tablet Take 1 tablet by mouth daily.    [provider]  omeprazole (PRILOSEC) 40 MG capsule Take 40 mg by mouth daily. 12/20/19   [provider]    Allergies    Codeine, Darvocet [propoxyphene n-acetaminophen], Lisinopril, Macrobid [nitrofurantoin macrocrystal], Morphine and related, Percocet [oxycodone-acetaminophen], Amoxicillin-pot clavulanate, and Penicillins  Review of Systems   Review of Systems  Constitutional:  Negative for chills and fever.  HENT:  Negative for ear pain and sore throat.   Eyes:  Negative for pain and visual disturbance.  Respiratory:  Negative for cough and shortness of breath.   Cardiovascular:  Negative for chest pain and palpitations.  Gastrointestinal:  Negative for abdominal pain and vomiting.  Genitourinary:  Negative for dysuria and hematuria.  Musculoskeletal:  Negative for arthralgias and back pain.  Skin:  Negative for color change and rash.  Neurological:  Positive for dizziness. Negative for seizures and syncope.  All other systems reviewed and are negative.  Physical Exam Updated Vital Signs BP (!) 155/76 (BP Location: Right Arm)   Pulse 60   Temp 98.7 F (37.1 C) (Oral)   Resp (!) 21   SpO2 99%   Physical Exam Vitals and nursing note reviewed.  Constitutional:      General: She is not in acute distress.    Appearance: She is well-developed.  HENT:     Head: Normocephalic and atraumatic.  Eyes:     Conjunctiva/sclera: Conjunctivae normal.  Cardiovascular:     Rate and Rhythm: Normal rate and regular rhythm.     Heart sounds: No murmur heard. Pulmonary:     Effort: Pulmonary effort is normal. No respiratory distress.     Breath sounds: Normal breath sounds.   Abdominal:     Palpations: Abdomen is soft.     Tenderness: There is no abdominal tenderness.  Musculoskeletal:        General: No swelling.     Cervical back: Neck supple.  Skin:    General: Skin is warm and dry.     Capillary Refill: Capillary refill takes less than 2 seconds.  Neurological:     Mental Status: She is alert.     Cranial Nerves: No cranial nerve deficit.     Sensory: No sensory deficit.     Motor: No weakness.  Psychiatric:        Mood and Affect: Mood normal.    ED Results / Procedures / Treatments   Labs (all labs ordered are listed, but only abnormal results are displayed) Labs Reviewed  CBC WITH DIFFERENTIAL/PLATELET - Abnormal; Notable for the following components:      Result Value   Hemoglobin 11.7 (*)    All other  components within normal limits  COMPREHENSIVE METABOLIC PANEL - Abnormal; Notable for the following components:   Potassium 5.4 (*)    AST 46 (*)    Total Bilirubin 1.3 (*)    All other components within normal limits  RESP PANEL BY RT-PCR (FLU A&B, COVID) ARPGX2  TROPONIN I (HIGH SENSITIVITY)  TROPONIN I (HIGH SENSITIVITY)    EKG None  Radiology CT ANGIO HEAD NECK W WO CM  Result Date: 09/29/2021 CLINICAL DATA:  Known aneurysm, persistent dizziness EXAM: CT ANGIOGRAPHY HEAD AND NECK TECHNIQUE: Multidetector CT imaging of the head and neck was performed using the standard protocol during bolus administration of intravenous contrast. Multiplanar CT image reconstructions and MIPs were obtained to evaluate the vascular anatomy. Carotid stenosis measurements (when applicable) are obtained utilizing NASCET criteria, using the distal internal carotid diameter as the denominator. CONTRAST:  16mL OMNIPAQUE IOHEXOL 350 MG/ML SOLN COMPARISON:  11/26/2019 FINDINGS: CT HEAD Brain: There is no acute intracranial hemorrhage, mass effect, or edema. Gray-white differentiation is preserved. There is no extra-axial fluid collection. Ventricles and sulci  are within normal limits in size and configuration. Vascular: No hyperdense vessel. Stent is present along the left ICA. Skull: Calvarium is unremarkable. Sinuses/Orbits: No acute finding. Other: None. Review of the MIP images confirms the above findings CTA NECK Aortic arch: Great vessel origins are patent. Direct origin of the left vertebral from the arch. Right carotid system: Patent.  No stenosis. Left carotid system: Patent. Mixed but primarily noncalcified plaque along the proximal internal carotid causing progressed but still less than 50% stenosis. Vertebral arteries: Patent.  Right vertebral artery is dominant. Skeleton: Cervical spine degenerative changes. Other neck: Unremarkable. Upper chest: Included lung apices are clear. Review of the MIP images confirms the above findings CTA HEAD Anterior circulation: Intracranial internal carotid arteries are patent. Patent flow diverting stent is present on the left spanning cavernous and proximal supraclinoid ICA. There is no evidence of residual aneurysm. Persistent medially directed right paraophthalmic aneurysm has increased in size measuring 5 mm (previously 4 mm). Anterior and middle cerebral arteries are patent. Posterior circulation: Intracranial vertebral arteries are patent. Basilar artery is patent. Major cerebellar artery origins are patent. Posterior cerebral arteries are patent. Venous sinuses: Patent as allowed by contrast bolus timing. Review of the MIP images confirms the above findings IMPRESSION: No acute intracranial abnormality. Increased but still less than 50% stenosis at the left ICA origin. Patent flow diverting stent of the left ICA without evidence of residual aneurysm. Increase in size of right paraophthalmic aneurysm (5 mm versus 4 mm). Electronically Signed   By: Macy Mis M.D.   On: 09/29/2021 13:06    Procedures Procedures   Medications Ordered in ED Medications  lactated ringers bolus 1,000 mL (0 mLs Intravenous Stopped  09/29/21 1342)  iohexol (OMNIPAQUE) 350 MG/ML injection 80 mL (80 mLs Intravenous Contrast Given 09/29/21 1231)  LORazepam (ATIVAN) tablet 1 mg (1 mg Oral Given 09/29/21 1345)    ED Course  I have reviewed the triage vital signs and the nursing notes.  Pertinent labs & imaging results that were available during my care of the patient were reviewed by me and considered in my medical decision making (see chart for details).    MDM Rules/Calculators/A&P                           Patient seen the emergency department for evaluation of dizziness.  Physical exam is unremarkable with a normal  neurologic exam, no focal motor or sensory deficits.  Patient has mild difficulty with ambulation but no true ataxia.  Laboratory evaluation with anemia at 11.7 which is improved from baseline.  Electrolytes with very mild hyperkalemia to 5.4 but is otherwise unremarkable outside of mild AST elevation of 46.  Troponin unremarkable.  COVID and flu negative.  CT angio of the brain and neck with no evidence of aneurysmal rupture, mild increase in size of the frontal paraclinoid aneurysm from 4 mm to 5 mm.  I spoke with the patient's neurosurgeon from [facility] who is aware of the new finding but states that her current symptoms are likely not related to this mild enlargement.  Patient given a fluid bolus here in the emergency department as well as Ativan and on reevaluation her symptoms have improved.  Patient able to ambulate with significant improvement.  Suspect element of dehydration with orthostatic hypotension.  Patient courage to drink plenty of fluids at home and was discharged with primary care follow-up.  Was given strict return precautions which she voiced understanding she was discharged.. Final Clinical Impression(s) / ED Diagnoses Final diagnoses:  Dizziness    Rx / DC Orders ED Discharge Orders     None        Ajani Rineer, MD 09/29/21 1601

## 2022-03-22 ENCOUNTER — Encounter (HOSPITAL_BASED_OUTPATIENT_CLINIC_OR_DEPARTMENT_OTHER): Payer: Self-pay

## 2022-03-22 ENCOUNTER — Emergency Department (HOSPITAL_BASED_OUTPATIENT_CLINIC_OR_DEPARTMENT_OTHER)
Admission: EM | Admit: 2022-03-22 | Discharge: 2022-03-22 | Discharge status: Against Medical Advice | Payer: Medicare PPO | Attending: Emergency Medicine | Admitting: Emergency Medicine

## 2022-03-22 ENCOUNTER — Other Ambulatory Visit: Payer: Self-pay

## 2022-03-22 DIAGNOSIS — Z5321 Procedure and treatment not carried out due to patient leaving prior to being seen by health care provider: Secondary | ICD-10-CM | POA: Insufficient documentation

## 2022-03-22 DIAGNOSIS — R519 Headache, unspecified: Secondary | ICD-10-CM | POA: Diagnosis present

## 2022-03-22 NOTE — ED Triage Notes (Signed)
Pt presents with a 4 day hx of HA. Pt does have a hx of non-ruptured cerebral aneurysm. Pt sent by PCP and was told to go to [facility] ED for evaluation.

## 2022-03-26 ENCOUNTER — Encounter (HOSPITAL_BASED_OUTPATIENT_CLINIC_OR_DEPARTMENT_OTHER): Payer: Self-pay

## 2022-03-26 ENCOUNTER — Other Ambulatory Visit: Payer: Self-pay

## 2022-03-26 ENCOUNTER — Emergency Department (HOSPITAL_BASED_OUTPATIENT_CLINIC_OR_DEPARTMENT_OTHER): Payer: Medicare PPO

## 2022-03-26 ENCOUNTER — Emergency Department (HOSPITAL_BASED_OUTPATIENT_CLINIC_OR_DEPARTMENT_OTHER)
Admission: EM | Admit: 2022-03-26 | Discharge: 2022-03-26 | Disposition: A | Discharge status: Home / Self Care | Payer: Medicare PPO | Attending: Emergency Medicine | Admitting: Emergency Medicine

## 2022-03-26 DIAGNOSIS — K219 Gastro-esophageal reflux disease without esophagitis: Secondary | ICD-10-CM | POA: Insufficient documentation

## 2022-03-26 DIAGNOSIS — I10 Essential (primary) hypertension: Secondary | ICD-10-CM | POA: Diagnosis not present

## 2022-03-26 DIAGNOSIS — R519 Headache, unspecified: Secondary | ICD-10-CM | POA: Insufficient documentation

## 2022-03-26 DIAGNOSIS — I6522 Occlusion and stenosis of left carotid artery: Secondary | ICD-10-CM | POA: Diagnosis not present

## 2022-03-26 DIAGNOSIS — Z20822 Contact with and (suspected) exposure to covid-19: Secondary | ICD-10-CM | POA: Insufficient documentation

## 2022-03-26 DIAGNOSIS — M542 Cervicalgia: Secondary | ICD-10-CM | POA: Diagnosis not present

## 2022-03-26 DIAGNOSIS — E7800 Pure hypercholesterolemia, unspecified: Secondary | ICD-10-CM | POA: Diagnosis not present

## 2022-03-26 LAB — CBC WITH DIFFERENTIAL/PLATELET
Abs Immature Granulocytes: 0.01 10*3/uL (ref 0.00–0.07)
Basophils Absolute: 0 10*3/uL (ref 0.0–0.1)
Basophils Relative: 1 %
Eosinophils Absolute: 0.1 10*3/uL (ref 0.0–0.5)
Eosinophils Relative: 2 %
HCT: 36.3 % (ref 36.0–46.0)
Hemoglobin: 11.5 g/dL — ABNORMAL LOW (ref 12.0–15.0)
Immature Granulocytes: 0 %
Lymphocytes Relative: 34 %
Lymphs Abs: 1.7 10*3/uL (ref 0.7–4.0)
MCH: 27.1 pg (ref 26.0–34.0)
MCHC: 31.7 g/dL (ref 30.0–36.0)
MCV: 85.6 fL (ref 80.0–100.0)
Monocytes Absolute: 0.6 10*3/uL (ref 0.1–1.0)
Monocytes Relative: 12 %
Neutro Abs: 2.5 10*3/uL (ref 1.7–7.7)
Neutrophils Relative %: 51 %
Platelets: 319 10*3/uL (ref 150–400)
RBC: 4.24 MIL/uL (ref 3.87–5.11)
RDW: 15 % (ref 11.5–15.5)
WBC: 4.9 10*3/uL (ref 4.0–10.5)
nRBC: 0 % (ref 0.0–0.2)

## 2022-03-26 LAB — COMPREHENSIVE METABOLIC PANEL
ALT: 20 U/L (ref 0–44)
AST: 24 U/L (ref 15–41)
Albumin: 3.8 g/dL (ref 3.5–5.0)
Alkaline Phosphatase: 86 U/L (ref 38–126)
Anion gap: 7 (ref 5–15)
BUN: 21 mg/dL (ref 8–23)
CO2: 25 mmol/L (ref 22–32)
Calcium: 9.2 mg/dL (ref 8.9–10.3)
Chloride: 106 mmol/L (ref 98–111)
Creatinine, Ser: 1.01 mg/dL — ABNORMAL HIGH (ref 0.44–1.00)
GFR, Estimated: >60 mL/min (ref >60)
Glucose, Bld: 89 mg/dL (ref 70–99)
Potassium: 4.3 mmol/L (ref 3.5–5.1)
Sodium: 138 mmol/L (ref 135–145)
Total Bilirubin: 0.5 mg/dL (ref 0.3–1.2)
Total Protein: 7.3 g/dL (ref 6.5–8.1)

## 2022-03-26 LAB — PROTIME-INR
INR: 0.9 (ref 0.8–1.2)
Prothrombin Time: 12 seconds (ref 11.4–15.2)

## 2022-03-26 LAB — SARS CORONAVIRUS 2 BY RT PCR: SARS Coronavirus 2 by RT PCR: NEGATIVE

## 2022-03-26 MED ORDER — SODIUM CHLORIDE 0.9 % IV BOLUS
1000.0000 mL | Freq: Once | INTRAVENOUS | Status: AC
  Administered 2022-03-26: 1000 mL via INTRAVENOUS

## 2022-03-26 MED ORDER — IOHEXOL 350 MG/ML SOLN
75.0000 mL | Freq: Once | INTRAVENOUS | Status: AC | PRN
  Administered 2022-03-26: 75 mL via INTRAVENOUS

## 2022-03-26 MED ORDER — DIPHENHYDRAMINE HCL 50 MG/ML IJ SOLN
50.0000 mg | Freq: Once | INTRAMUSCULAR | Status: AC
  Administered 2022-03-26: 50 mg via INTRAVENOUS
  Filled 2022-03-26: qty 1

## 2022-03-26 MED ORDER — PROCHLORPERAZINE EDISYLATE 10 MG/2ML IJ SOLN
10.0000 mg | Freq: Once | INTRAMUSCULAR | Status: AC
Start: 2022-03-26 — End: 2022-03-26
  Administered 2022-03-26: 10 mg via INTRAVENOUS
  Filled 2022-03-26: qty 2

## 2022-03-26 NOTE — ED Notes (Signed)
Pt A&Ox4 ambulatory at d/c with independent steady gait. Pt reports she feels better. Pt verbalized understanding of d/c instructions and follow up care.

## 2022-03-26 NOTE — ED Provider Notes (Signed)
[facility] EMERGENCY DEPARTMENT Provider Note   CSN: [id number] Arrival date & time: 03/26/22  1740     History  Chief Complaint  Patient presents with   Headache    Nancy Tran is a 66 y.o. female.  The history is provided by the patient and medical records. No language interpreter was used.  Headache Pain location:  Frontal Quality:  Dull Radiates to:  Does not radiate Severity currently:  9/10 Severity at highest:  8/10 Onset quality:  Gradual Duration:  1 week Timing:  Constant Progression:  Waxing and waning Chronicity:  Recurrent Similar to prior headaches: yes   Relieved by:  Nothing Worsened by:  Nothing Ineffective treatments:  None tried Associated symptoms: neck pain   Associated symptoms: no abdominal pain, no back pain, no blurred vision, no congestion, no cough, no diarrhea, no drainage, no fatigue, no fever, no myalgias, no nausea, no neck stiffness, no numbness, no paresthesias, no photophobia, no seizures, no visual change, no vomiting and no weakness       Home Medications Prior to Admission medications   Medication Sig Start Date End Date Taking? Authorizing Provider  baclofen (LIORESAL) 10 MG tablet Take 5-10 mg by mouth every 8 (eight) hours as needed (pain). 11/10/19   [provider]  ferrous sulfate 325 (65 FE) MG tablet Take 1 tablet (325 mg total) by mouth 2 (two) times daily with a meal. 12/21/20   Nita Sells, MD  Multiple Vitamin (MULTIVITAMIN WITH MINERALS) TABS tablet Take 1 tablet by mouth daily.    [provider]  omeprazole (PRILOSEC) 40 MG capsule Take 40 mg by mouth daily. 12/20/19   [provider]      Allergies    Codeine, Darvocet [propoxyphene n-acetaminophen], Lisinopril, Macrobid [nitrofurantoin macrocrystal], Morphine and related, Percocet [oxycodone-acetaminophen], Amoxicillin-pot clavulanate, and Penicillins    Review of Systems   Review of Systems  Constitutional:   Negative for chills, fatigue and fever.  HENT:  Negative for congestion and postnasal drip.   Eyes:  Negative for blurred vision and photophobia.  Respiratory:  Negative for cough, chest tightness, shortness of breath and wheezing.   Cardiovascular:  Negative for chest pain, palpitations and leg swelling.  Gastrointestinal:  Negative for abdominal pain, constipation, diarrhea, nausea and vomiting.  Genitourinary:  Negative for dysuria, flank pain and frequency.  Musculoskeletal:  Positive for neck pain. Negative for back pain, myalgias and neck stiffness.  Skin:  Negative for rash and wound.  Neurological:  Positive for headaches. Negative for seizures, weakness, light-headedness, numbness and paresthesias.  Psychiatric/Behavioral:  Negative for agitation and confusion.   All other systems reviewed and are negative.  Physical Exam Updated Vital Signs BP 134/75 (BP Location: Left Arm)   Pulse 64   Temp 98.4 F (36.9 C) (Oral)   Resp 20   SpO2 100%  Physical Exam Vitals and nursing note reviewed.  Constitutional:      General: She is not in acute distress.    Appearance: She is well-developed. She is not ill-appearing, toxic-appearing or diaphoretic.  HENT:     Head: Normocephalic and atraumatic.     Mouth/Throat:     Mouth: Mucous membranes are moist.  Eyes:     Extraocular Movements: Extraocular movements intact.     Conjunctiva/sclera: Conjunctivae normal.     Pupils: Pupils are equal, round, and reactive to light. Pupils are equal.  Cardiovascular:     Rate and Rhythm: Normal rate and regular rhythm.     Heart  sounds: No murmur heard. Pulmonary:     Effort: Pulmonary effort is normal. No respiratory distress.     Breath sounds: Normal breath sounds. No wheezing, rhonchi or rales.  Chest:     Chest wall: No tenderness.  Abdominal:     Palpations: Abdomen is soft.     Tenderness: There is no abdominal tenderness.  Musculoskeletal:        General: No swelling.      Cervical back: Neck supple.  Skin:    General: Skin is warm and dry.     Capillary Refill: Capillary refill takes less than 2 seconds.     Findings: No rash.  Neurological:     Mental Status: She is alert.  Psychiatric:        Mood and Affect: Mood normal. Mood is not anxious.    ED Results / Procedures / Treatments   Labs (all labs ordered are listed, but only abnormal results are displayed) Labs Reviewed  CBC WITH DIFFERENTIAL/PLATELET - Abnormal; Notable for the following components:      Result Value   Hemoglobin 11.5 (*)    All other components within normal limits  COMPREHENSIVE METABOLIC PANEL - Abnormal; Notable for the following components:   Creatinine, Ser 1.01 (*)    All other components within normal limits  SARS CORONAVIRUS 2 BY RT PCR  PROTIME-INR    EKG None  Radiology CT ANGIO HEAD NECK W WO CM  Result Date: 03/26/2022 CLINICAL DATA:  Initial evaluation for acute headache. EXAM: CT ANGIOGRAPHY HEAD AND NECK TECHNIQUE: Multidetector CT imaging of the head and neck was performed using the standard protocol during bolus administration of intravenous contrast. Multiplanar CT image reconstructions and MIPs were obtained to evaluate the vascular anatomy. Carotid stenosis measurements (when applicable) are obtained utilizing NASCET criteria, using the distal internal carotid diameter as the denominator. RADIATION DOSE REDUCTION: This exam was performed according to the departmental dose-optimization program which includes automated exposure control, adjustment of the mA and/or kV according to patient size and/or use of iterative reconstruction technique. CONTRAST:  1m OMNIPAQUE IOHEXOL 350 MG/ML SOLN COMPARISON:  Prior study from 09/29/2021. FINDINGS: CT HEAD FINDINGS Brain: Cerebral volume within normal limits for patient age. No evidence for acute intracranial hemorrhage. No findings to suggest acute large vessel territory infarct. No mass lesion, midline shift, or mass  effect. Ventricles are normal in size without evidence for hydrocephalus. No extra-axial fluid collection identified. Vascular: No hyperdense vessel. Skull: Scalp soft tissues demonstrate no acute abnormality. Calvarium intact. Sinuses/Orbits: Globes and orbital soft tissues within normal limits. Visualized paranasal sinuses are clear. No mastoid effusion. CTA NECK FINDINGS Aortic arch: Visualized aortic arch normal in caliber with normal branch pattern. Mild atheromatous change about the arch and origin of the great vessels without significant stenosis. Right carotid system: Right common and internal carotid arteries patent without stenosis or dissection. Left carotid system: Left CCA patent without stenosis. Mixed plaque about the proximal cervical left ICA without hemodynamically significant greater than 50% stenosis, stable. Left ICA patent distally without stenosis or dissection. Vertebral arteries: Left vertebral artery hypoplastic and arises directly from the aortic arch. Vertebral arteries patent without stenosis or dissection. Skeleton: No discrete or worrisome osseous lesions. Moderate multilevel cervical spondylosis. Other neck: No other acute soft tissue abnormality within the neck. 1 cm peripherally calcified right thyroid nodule, of doubtful significance given size and patient age, no follow-up imaging recommended (ref: J Am Coll Radiol. 2015 Feb;12(2): 143-50). Upper chest: Subcentimeter calcified granuloma noted  within the left upper lobe. Visualized upper chest demonstrates no other acute finding. Review of the MIP images confirms the above findings CTA HEAD FINDINGS Anterior circulation: Petrous segments remain widely patent. 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 Flow diverting stent in place within the left carotid siphon. No residual or recurrent left-sided aneurysm is seen. A1 segments patent bilaterally. Normal  anterior communicating artery complex. Both anterior cerebral arteries patent to their distal aspects without stenosis. No M1 stenosis or occlusion. Normal MCA bifurcations. Distal MCA branches perfused and symmetric. Posterior circulation: Both V4 segments patent without stenosis. Left PICA patent. Right PICA not well seen. Basilar patent to its distal aspect without stenosis. Superior cerebellar and posterior cerebral arteries patent bilaterally. Venous sinuses: Patent allowing for timing the contrast bolus. Anatomic variants: None significant. Review of the MIP images confirms the above findings IMPRESSION: 1. No acute intracranial abnormality. 2. Negative CTA for large vessel occlusion, dissection, or other emergent finding. 3. 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 4. Flow diverting stent in place within the left carotid siphon without residual or recurrent aneurysm. 5. Atheromatous change about the proximal cervical left ICA without hemodynamically significant greater than 50% stenosis, stable. Electronically Signed   By: Jeannine Boga M.D.   On: 03/26/2022 22:01    Procedures Procedures    Medications Ordered in ED Medications  sodium chloride 0.9 % bolus 1,000 mL (0 mLs Intravenous Stopped 03/26/22 2305)  prochlorperazine (COMPAZINE) injection 10 mg (10 mg Intravenous Given 03/26/22 1951)  diphenhydrAMINE (BENADRYL) injection 50 mg (50 mg Intravenous Given 03/26/22 1952)  iohexol (OMNIPAQUE) 350 MG/ML injection 75 mL (75 mLs Intravenous Contrast Given 03/26/22 2028)    ED Course/ Medical Decision Making/ A&P                           Medical Decision Making Amount and/or Complexity of Data Reviewed Labs: ordered. Radiology: ordered.  Risk Prescription drug management.    Karleigh Zarr is a 66 y.o. female with a past medical history significant for multiple cerebral aneurysms with 1 being repaired and 1 not, GERD, previous GI  bleeding, hypertension, hypercholesterolemia, and previous diverticulitis who presents with headache and neck pain.  According to patient, for the last week she has had pain in her neck and into her head.  It goes behind her eyes and goes up in the.  She says she has had sinus infection in the past but is denying any fevers or chills or persistent cough.  She does report some rhinorrhea that is mild and inconsistent.  She denies neurologic deficits including no numbness, tingling, or weakness of extremities.  Denies any nausea, vomiting, or vision changes.  Reports just the severe headache that is 9 out of 10 in severity.  She reports that has been there for the last week or so.  She talked her neurologist who recommended coming in to get evaluated.  On exam, lungs clear and chest nontender.  Abdomen nontender.  I do not appreciate a carotid bruit.  Neck was mildly tender in the bilateral paraspinal neck going into her head.  Pupils are symmetric and reactive with no next ocular movements.  Denies any vision changes.  Clear speech.  Symmetric smile.  No other abnormalities on initial exam.  Good pulses in extremities.  Given the patient's history of aneurysms and the  severe headache with neck pain I do feel we need to rule out acute dissection or aneurysmal troubles.  We will get CT of the head and neck and will get screening labs.  We will give her headache cocktail as well.  Anticipate reassessment after work-up to determine disposition.  CT imaging showed no acute changes from prior.  She still has the aneurysm but it is stable as is the stenting.  No evidence of bleeding or subarachnoid hemorrhage seen.  When I went to reassess the patient, her symptoms have resolved and her headache is gone after the headache cocktail.  We discussed and offered lumbar puncture to look for xanthochromia and more definitively rule out a subarachnoid but she does not want it since her symptoms are gone.  She will  follow-up with her primary doctor and understands return precautions.  She no other questions or concerns and was discharged in good condition.          Final Clinical Impression(s) / ED Diagnoses Final diagnoses:  Bad headache    Rx / DC Orders ED Discharge Orders     None       Clinical Impression: 1. Bad headache     Disposition: Discharge  Condition: Good  I have discussed the results, Dx and Tx plan with the pt(& family if present). He/she/they expressed understanding and agree(s) with the plan. Discharge instructions discussed at great length. Strict return precautions discussed and pt &/or family have verbalized understanding of the instructions. No further questions at time of discharge.    New Prescriptions   No medications on file    Follow Up: Lilian Coma., MD [address] [place] [address] [phone]     [place] [address] [id number] [address] [place] [id number]        Dayson Aboud, Gwenyth Allegra, MD 03/26/22 [phone]

## 2022-03-26 NOTE — ED Triage Notes (Signed)
Pt presents with 7 day history of headache.Pain is between eyes and goes up hx of aneurysm. Spoke with PCP and neuro and advised to come to ED  Unsure if HA is from sinus infection.

## 2022-03-26 NOTE — ED Notes (Signed)
Pt still at CT.

## 2022-03-26 NOTE — Discharge Instructions (Signed)
Your history, exam, work-up today were overall reassuring.  Your CT imaging did not show any new changes from prior.  After the medications your headache has resolved.  We had a shared decision-making conversation including offering lumbar puncture but given her resolution of symptoms, we agree with discharge home.  Please rest and stay hydrated and follow-up with her primary doctor

## 2022-03-26 NOTE — ED Notes (Signed)
Pt transported to CT ?

## 2022-03-26 NOTE — ED Notes (Signed)
Pt ambulatory to restroom with independent steady gait °

## 2022-12-18 ENCOUNTER — Emergency Department (HOSPITAL_BASED_OUTPATIENT_CLINIC_OR_DEPARTMENT_OTHER): Payer: Medicare PPO

## 2022-12-18 ENCOUNTER — Encounter (HOSPITAL_BASED_OUTPATIENT_CLINIC_OR_DEPARTMENT_OTHER): Payer: Self-pay | Admitting: Pediatrics

## 2022-12-18 ENCOUNTER — Other Ambulatory Visit: Payer: Self-pay

## 2022-12-18 ENCOUNTER — Emergency Department (HOSPITAL_BASED_OUTPATIENT_CLINIC_OR_DEPARTMENT_OTHER)
Admission: EM | Admit: 2022-12-18 | Discharge: 2022-12-18 | Disposition: A | Discharge status: Home / Self Care | Payer: Medicare PPO | Attending: Emergency Medicine | Admitting: Emergency Medicine

## 2022-12-18 DIAGNOSIS — R109 Unspecified abdominal pain: Secondary | ICD-10-CM | POA: Diagnosis present

## 2022-12-18 DIAGNOSIS — R1084 Generalized abdominal pain: Secondary | ICD-10-CM | POA: Insufficient documentation

## 2022-12-18 LAB — COMPREHENSIVE METABOLIC PANEL
ALT: 26 U/L (ref 0–44)
AST: 24 U/L (ref 15–41)
Albumin: 4 g/dL (ref 3.5–5.0)
Alkaline Phosphatase: 92 U/L (ref 38–126)
Anion gap: 7 (ref 5–15)
BUN: 17 mg/dL (ref 8–23)
CO2: 23 mmol/L (ref 22–32)
Calcium: 9.3 mg/dL (ref 8.9–10.3)
Chloride: 104 mmol/L (ref 98–111)
Creatinine, Ser: 0.86 mg/dL (ref 0.44–1.00)
GFR, Estimated: >60 mL/min (ref >60)
Glucose, Bld: 114 mg/dL — ABNORMAL HIGH (ref 70–99)
Potassium: 4.4 mmol/L (ref 3.5–5.1)
Sodium: 134 mmol/L — ABNORMAL LOW (ref 135–145)
Total Bilirubin: 0.4 mg/dL (ref 0.3–1.2)
Total Protein: 7.8 g/dL (ref 6.5–8.1)

## 2022-12-18 LAB — CBC
HCT: 36.5 % (ref 36.0–46.0)
Hemoglobin: 11.5 g/dL — ABNORMAL LOW (ref 12.0–15.0)
MCH: 26.4 pg (ref 26.0–34.0)
MCHC: 31.5 g/dL (ref 30.0–36.0)
MCV: 83.7 fL (ref 80.0–100.0)
Platelets: 401 10*3/uL — ABNORMAL HIGH (ref 150–400)
RBC: 4.36 MIL/uL (ref 3.87–5.11)
RDW: 14.4 % (ref 11.5–15.5)
WBC: 5.6 10*3/uL (ref 4.0–10.5)
nRBC: 0 % (ref 0.0–0.2)

## 2022-12-18 LAB — URINALYSIS, MICROSCOPIC (REFLEX)

## 2022-12-18 LAB — URINALYSIS, ROUTINE W REFLEX MICROSCOPIC
Bilirubin Urine: NEGATIVE
Glucose, UA: NEGATIVE mg/dL
Ketones, ur: NEGATIVE mg/dL
Leukocytes,Ua: NEGATIVE
Nitrite: NEGATIVE
Protein, ur: NEGATIVE mg/dL
Specific Gravity, Urine: 1.01 (ref 1.005–1.030)
pH: 6 (ref 5.0–8.0)

## 2022-12-18 LAB — LIPASE, BLOOD: Lipase: 34 U/L (ref 11–51)

## 2022-12-18 NOTE — ED Triage Notes (Signed)
C/O lower abdominal pain radiating towards the back; denies any vaginal discharge but feels pressure that she needed to pee.

## 2022-12-18 NOTE — ED Notes (Signed)
Unable to get vital signs at this time due to pt leaving. Pt did not want to wait for this RN to speak to MD Countryman in regards to a pin medication rx. Pt educated on Tylenol 1032m 3x daily recommendation on discharge paperwork.

## 2022-12-18 NOTE — Discharge Instructions (Signed)
You were seen today for abdominal pain.  Your workup was grossly reassuring. I am worried for developing pain from fibroids or possibly pelvic floor dysfunction.  However you need to follow-up with your gynecology for further diagnostics and management.  Will recommend you increase her Tylenol to 1000 mg 3 times daily for the next week and see how your symptoms respond. Thank you for the opportunity to participate in your care, please return with any worsening fevers chills nausea vomiting, syncope, chest pain or abdominal pain. Tretha Sciara MD

## 2022-12-18 NOTE — ED Provider Notes (Signed)
[facility] Provider Note   CSN: [id number] Arrival date & time: 12/18/22  1731     History Chief Complaint  Patient presents with   Abdominal Pain    HPI Nancy Tran is a 67 y.o. female presenting for a chief complaint of abdominal pain.  She is a 67 year old female with a minimal medical history.  States that over the past 3 months she has had intermittent abdominal pain but it feels more consistent over the past week.  Gets better with Tylenol but she only takes 500 mg Tylenol twice a day.  She denies fevers or chills, nausea vomiting, syncope shortness of breath.  Otherwise ambulatory tolerating p.o. intake at this time..   Patient's recorded medical, surgical, social, medication list and allergies were reviewed in the Snapshot window as part of the initial history.   Review of Systems   Review of Systems  Constitutional:  Positive for fatigue. Negative for chills and fever.  HENT:  Negative for ear pain and sore throat.   Eyes:  Negative for pain and visual disturbance.  Respiratory:  Negative for cough and shortness of breath.   Cardiovascular:  Negative for chest pain and palpitations.  Gastrointestinal:  Positive for abdominal pain and nausea. Negative for vomiting.  Genitourinary:  Negative for dysuria and hematuria.  Musculoskeletal:  Negative for arthralgias and back pain.  Skin:  Negative for color change and rash.  Neurological:  Negative for seizures and syncope.  All other systems reviewed and are negative.   Physical Exam Updated Vital Signs BP (!) 156/61 (BP Location: Left Arm)   Pulse 70   Temp 99.4 F (37.4 C) (Oral)   Resp 18   Ht 5' 7"$  (1.702 m)   Wt 102.1 kg   SpO2 100%   BMI 35.24 kg/m  Physical Exam Vitals and nursing note reviewed.  Constitutional:      General: She is not in acute distress.    Appearance: She is well-developed.  HENT:     Head: Normocephalic and atraumatic.  Eyes:      Conjunctiva/sclera: Conjunctivae normal.  Cardiovascular:     Rate and Rhythm: Normal rate and regular rhythm.     Heart sounds: No murmur heard. Pulmonary:     Effort: Pulmonary effort is normal. No respiratory distress.     Breath sounds: Normal breath sounds.  Abdominal:     Palpations: Abdomen is soft.     Tenderness: There is no abdominal tenderness.  Musculoskeletal:        General: No swelling.     Cervical back: Neck supple.  Skin:    General: Skin is warm and dry.     Capillary Refill: Capillary refill takes less than 2 seconds.  Neurological:     Mental Status: She is alert.  Psychiatric:        Mood and Affect: Mood normal.      ED Course/ Medical Decision Making/ A&P    Procedures Procedures   Medications Ordered in ED Medications - No data to display Medical Decision Making:   Nancy Tran is a 67 y.o. female who presented to the ED today with abdominal pain, detailed above.    Patient's presentation is complicated by their history of obesity.  Patient placed on continuous vitals and telemetry monitoring while in ED which was reviewed periodically.  Complete initial physical exam performed, notably the patient was abdominal pain.     Reviewed and confirmed nursing documentation for past medical  history, family history, social history.    Initial Assessment:   With the patient's presentation of abdominal pain, most likely diagnosis is nonspecific etiology. Other diagnoses were considered including (but not limited to) gastroenteritis, colitis, small bowel obstruction, appendicitis, cholecystitis, pancreatitis, nephrolithiasis, UTI, pyleonephritis, ovarian torsion. These are considered less likely due to history of present illness and physical exam findings.   This is most consistent with an acute life/limb threatening illness complicated by underlying chronic conditions.   Initial Plan:  CBC/CMP to evaluate for underlying infectious/metabolic etiology for  patient's abdominal pain  Lipase to evaluate for pancreatitis  EKG to evaluate for cardiac source of pain  Pelvic US to evaluate for torsion/other intrapelvic etiology of patient symptoms Urinalysis and repeat physical assessment to evaluate for UTI/Pyelonpehritis  Empiric management of symptoms with escalating pain control and antiemetics as needed.   Initial Study Results:   Laboratory  All laboratory results reviewed without evidence of clinically relevant pathology.      All images reviewed independently. Agree with radiology report at this time.   US Pelvis Complete  Result Date: 12/18/2022 CLINICAL DATA:  Pelvic pain and pressure. EXAM: TRANSABDOMINAL AND TRANSVAGINAL ULTRASOUND OF PELVIS TECHNIQUE: Both transabdominal and transvaginal ultrasound examinations of the pelvis were performed. Transabdominal technique was performed for global imaging of the pelvis including uterus, ovaries, adnexal regions, and pelvic cul-de-sac. It was necessary to proceed with endovaginal exam following the transabdominal exam to visualize the endometrium and ovaries. COMPARISON:  None Available. FINDINGS: Evaluation is limited due to overlying bowel gas. Uterus Measurements: 5.1 x 2.9 x 3.2 cm = volume: 25 mL. The uterus is anteverted and appears heterogeneous. There is a 1.2 x 1.3 x 1.4 cm posterior fundal and a 0: By 0.8 x 0.9 cm posterior body fibroid. Endometrium Thickness: 4 mm.  There is trace fluid within the endometrium. Right ovary Measurements: 1.8 x 1.1 x 1.3 cm = volume: 2.5 mL. Normal appearance/no adnexal mass. Left ovary Not visualized. Other findings No abnormal free fluid. IMPRESSION: 1. Heterogeneous uterus with small fibroids. 2. Unremarkable endometrium and the right ovary. 3. Nonvisualization of the left ovary. Electronically Signed   By: Anner Crete M.D.   On: 12/18/2022 19:12   US Transvaginal Non-OB  Result Date: 12/18/2022 CLINICAL DATA:  Pelvic pain and pressure. EXAM:  TRANSABDOMINAL AND TRANSVAGINAL ULTRASOUND OF PELVIS TECHNIQUE: Both transabdominal and transvaginal ultrasound examinations of the pelvis were performed. Transabdominal technique was performed for global imaging of the pelvis including uterus, ovaries, adnexal regions, and pelvic cul-de-sac. It was necessary to proceed with endovaginal exam following the transabdominal exam to visualize the endometrium and ovaries. COMPARISON:  None Available. FINDINGS: Evaluation is limited due to overlying bowel gas. Uterus Measurements: 5.1 x 2.9 x 3.2 cm = volume: 25 mL. The uterus is anteverted and appears heterogeneous. There is a 1.2 x 1.3 x 1.4 cm posterior fundal and a 0: By 0.8 x 0.9 cm posterior body fibroid. Endometrium Thickness: 4 mm.  There is trace fluid within the endometrium. Right ovary Measurements: 1.8 x 1.1 x 1.3 cm = volume: 2.5 mL. Normal appearance/no adnexal mass. Left ovary Not visualized. Other findings No abnormal free fluid. IMPRESSION: 1. Heterogeneous uterus with small fibroids. 2. Unremarkable endometrium and the right ovary. 3. Nonvisualization of the left ovary. Electronically Signed   By: Anner Crete M.D.   On: 12/18/2022 19:12    Final Reassessment and Plan:   Reevaluated after 3-hour observation emergency room.  Possibly patient is ambulatory tolerating p.o. intake  requesting discharge.  Objective evaluation only reveals uterine fibroids without any other abnormality.  Her clinical history does raise concern for possible pelvic floor dysfunction given her worsening feeling of sinking sensation in her lower pelvis as well as musculoskeletal strain worse with standing.  Recommend she follow-up with a gynecologist.  She is already arranged an appointment for next week.  Given overall well appearance, plan will be to escalate her Tylenol therapy to 3 times daily 1000 mg and follow-up with her primary care provider for reassessment pending gynecologic evaluation.  Disposition:  I have  considered need for hospitalization, however, considering all of the above, I believe this patient is stable for discharge at this time.  Patient/family educated about specific return precautions for given chief complaint and symptoms.  Patient/family educated about follow-up with PCP.     Patient/family expressed understanding of return precautions and need for follow-up. Patient spoken to regarding all imaging and laboratory results and appropriate follow up for these results. All education provided in verbal form with additional information in written form. Time was allowed for answering of patient questions. Patient discharged.     Emergency Department Medication Summary:   Medications - No data to display         Clinical Impression:  1. Generalized abdominal pain      Discharge   Final Clinical Impression(s) / ED Diagnoses Final diagnoses:  Generalized abdominal pain    Rx / DC Orders ED Discharge Orders     None         Tretha Sciara, MD 12/18/22 2054

## 2023-09-27 ENCOUNTER — Emergency Department (HOSPITAL_BASED_OUTPATIENT_CLINIC_OR_DEPARTMENT_OTHER): Payer: Medicare PPO

## 2023-09-27 ENCOUNTER — Other Ambulatory Visit: Payer: Self-pay

## 2023-09-27 ENCOUNTER — Emergency Department (HOSPITAL_BASED_OUTPATIENT_CLINIC_OR_DEPARTMENT_OTHER)
Admission: EM | Admit: 2023-09-27 | Discharge: 2023-09-28 | Disposition: A | Discharge status: Home / Self Care | Payer: Medicare PPO | Attending: Emergency Medicine | Admitting: Emergency Medicine

## 2023-09-27 ENCOUNTER — Encounter (HOSPITAL_BASED_OUTPATIENT_CLINIC_OR_DEPARTMENT_OTHER): Payer: Self-pay | Admitting: Emergency Medicine

## 2023-09-27 DIAGNOSIS — Z87891 Personal history of nicotine dependence: Secondary | ICD-10-CM | POA: Diagnosis not present

## 2023-09-27 DIAGNOSIS — R1030 Lower abdominal pain, unspecified: Secondary | ICD-10-CM | POA: Diagnosis not present

## 2023-09-27 DIAGNOSIS — R197 Diarrhea, unspecified: Secondary | ICD-10-CM | POA: Insufficient documentation

## 2023-09-27 DIAGNOSIS — I10 Essential (primary) hypertension: Secondary | ICD-10-CM | POA: Insufficient documentation

## 2023-09-27 DIAGNOSIS — R109 Unspecified abdominal pain: Secondary | ICD-10-CM | POA: Diagnosis present

## 2023-09-27 LAB — I-STAT CHEM 8, ED
BUN: 21 mg/dL (ref 8–23)
Calcium, Ion: 1.25 mmol/L (ref 1.15–1.40)
Chloride: 97 mmol/L — ABNORMAL LOW (ref 98–111)
Creatinine, Ser: 1.7 mg/dL — ABNORMAL HIGH (ref 0.44–1.00)
Glucose, Bld: 90 mg/dL (ref 70–99)
HCT: 35 % — ABNORMAL LOW (ref 36.0–46.0)
Hemoglobin: 11.9 g/dL — ABNORMAL LOW (ref 12.0–15.0)
Potassium: 3.5 mmol/L (ref 3.5–5.1)
Sodium: 138 mmol/L (ref 135–145)
TCO2: 29 mmol/L (ref 22–32)

## 2023-09-27 LAB — URINALYSIS, MICROSCOPIC (REFLEX)

## 2023-09-27 LAB — URINALYSIS, ROUTINE W REFLEX MICROSCOPIC
Bilirubin Urine: NEGATIVE
Glucose, UA: NEGATIVE mg/dL
Hgb urine dipstick: NEGATIVE
Ketones, ur: NEGATIVE mg/dL
Nitrite: NEGATIVE
Protein, ur: NEGATIVE mg/dL
Specific Gravity, Urine: 1.015 (ref 1.005–1.030)
pH: 6.5 (ref 5.0–8.0)

## 2023-09-27 MED ORDER — IOHEXOL 300 MG/ML  SOLN
100.0000 mL | Freq: Once | INTRAMUSCULAR | Status: AC | PRN
  Administered 2023-09-28: 100 mL via INTRAVENOUS

## 2023-09-27 NOTE — ED Triage Notes (Signed)
Lower abdomen/groin pain since November has been seen by OB and had X-rays and US done. X-rays not back.  Called Nurse on call and was told to come to ED.

## 2023-09-28 LAB — CBC
HCT: 32.7 % — ABNORMAL LOW (ref 36.0–46.0)
Hemoglobin: 10.3 g/dL — ABNORMAL LOW (ref 12.0–15.0)
MCH: 26.1 pg (ref 26.0–34.0)
MCHC: 31.5 g/dL (ref 30.0–36.0)
MCV: 83 fL (ref 80.0–100.0)
Platelets: 372 10*3/uL (ref 150–400)
RBC: 3.94 MIL/uL (ref 3.87–5.11)
RDW: 14.8 % (ref 11.5–15.5)
WBC: 5 10*3/uL (ref 4.0–10.5)
nRBC: 0 % (ref 0.0–0.2)

## 2023-09-28 LAB — HEPATIC FUNCTION PANEL
ALT: 16 U/L (ref 0–44)
AST: 25 U/L (ref 15–41)
Albumin: 3.9 g/dL (ref 3.5–5.0)
Alkaline Phosphatase: 59 U/L (ref 38–126)
Bilirubin, Direct: <0.1 mg/dL (ref 0.0–0.2)
Total Bilirubin: 0.6 mg/dL (ref <1.2)
Total Protein: 7.4 g/dL (ref 6.5–8.1)

## 2023-09-28 LAB — LIPASE, BLOOD: Lipase: 40 U/L (ref 11–51)

## 2023-09-28 MED ORDER — DICYCLOMINE HCL 20 MG PO TABS: 20.0000 mg | ORAL_TABLET | Freq: Two times a day (BID) | ORAL | 0 refills | Status: AC

## 2023-09-28 MED ORDER — DICYCLOMINE HCL 10 MG PO CAPS
10.0000 mg | ORAL_CAPSULE | Freq: Once | ORAL | Status: AC
  Administered 2023-09-28: 10 mg via ORAL
  Filled 2023-09-28: qty 1

## 2023-09-28 NOTE — ED Provider Notes (Signed)
[facility]-EMERGENCY DEPT [facility] Emergency Department Provider Note MRN:  [id number]  Arrival date & time: 09/28/23     Chief Complaint   Abdominal pain History of Present Illness   Nancy Tran is a 67 y.o. year-old female with a history of diverticulitis, hypertension presenting to the ED with chief complaint of abdominal pain.  Lower abdominal pain for the past month.  Some loose stools.  No fever, no nausea vomiting, no chest pain or shortness of breath.  Has had an x-ray and a vaginal ultrasound in the outpatient setting, both unremarkable.  Review of Systems  A thorough review of systems was obtained and all systems are negative except as noted in the HPI and PMH.   Patient's Health History    Past Medical History:  Diagnosis Date   Adenoma of colon 2007   Adenomatous polyps in 2007, 2012, 2017.  GI MD is Tri Le in [place].     Complication of anesthesia    Diverticulitis 12/2019   Gastritis, erosive 08/2018   EGD by Dr Devona Konig.     High cholesterol    History of cerebral aneurysm    bil internal carotid artery aneurysms.   embollization and stent to left 12/29/19 at [facility].     Hypertension    Normocytic anemia 2016   PONV (postoperative nausea and vomiting)    Tubal ligation status     Past Surgical History:  Procedure Laterality Date   BIOPSY  01/27/2020   Procedure: BIOPSY;  Surgeon: Iva Boop, MD;  Location: [facility];  Service: Endoscopy;;   BREAST SURGERY     CEREBRAL ANEURYSM REPAIR  12/29/2019   COLONOSCOPY     COLONOSCOPY WITH PROPOFOL N/A 01/27/2020   Procedure: COLONOSCOPY WITH PROPOFOL;  Surgeon: Iva Boop, MD;  Location: [facility] ENDOS[facility];  Service: Endoscopy;  Laterality: N/A;   IR ANGIOGRAM SELECTIVE EACH ADDITIONAL VESSEL  01/25/2020   IR ANGIOGRAM SELECTIVE EACH ADDITIONAL VESSEL  01/25/2020   IR ANGIOGRAM VISCERAL SELECTIVE  01/25/2020   IR ANGIOGRAM VISCERAL SELECTIVE  01/25/2020   IR US GUIDE VASC ACCESS RIGHT   01/25/2020   TUBAL LIGATION      History reviewed. No pertinent family history.  Social History   Socioeconomic History   Marital status: Married    Spouse name: Not on file   Number of children: Not on file   Years of education: Not on file   Highest education level: Not on file  Occupational History   Not on file  Tobacco Use   Smoking status: Former   Smokeless tobacco: Never   Tobacco comments:    QUIT IN 1992  Vaping Use   Vaping status: Never Used  Substance and Sexual Activity   Alcohol use: Not Currently   Drug use: No   Sexual activity: Yes    Birth control/protection: None, Surgical  Other Topics Concern   Not on file  Social History Narrative   Not on file   Social Determinants of Health   Financial Resource Strain: Low Risk  (06/19/2023)   Received from [facility]   Overall Financial Resource Strain (CARDIA)    Difficulty of Paying Living Expenses: Not hard at all  Food Insecurity: No Food Insecurity (06/19/2023)   Received from [facility]   Hunger Vital Sign    Worried About Running Out of Food in the Last Year: Never true    Ran Out of Food in the Last Year: Never true  Transportation  Needs: No Transportation Needs (06/19/2023)   Received from [facility] - Transportation    Lack of Transportation (Medical): No    Lack of Transportation (Non-Medical): No  Physical Activity: Inactive (05/03/2021)   Received from [facility], [facility]   Exercise Vital Sign    Days of Exercise per Week: 0 days    Minutes of Exercise per Session: 0 min  Stress: No Stress Concern Present (05/03/2021)   Received from [facility], [facility] - Occupational Stress Questionnaire    Feeling of Stress : Only a little  Social Connections: Unknown (02/28/2022)   Received from [facility]   Social Network    Social Network: Not on file  Intimate Partner Violence: Unknown (01/28/2022)   Received  from [facility], [facility]   HITS    Physically Hurt: Not on file    Insult or Talk Down To: Not on file    Threaten Physical Harm: Not on file    Scream or Curse: Not on file     Physical Exam   Vitals:   09/27/23 2233  BP: 134/62  Pulse: 62  Resp: 18  Temp: 97.7 F (36.5 C)  SpO2: 100%    CONSTITUTIONAL: Well-appearing, NAD NEURO/PSYCH:  Alert and oriented x 3, no focal deficits EYES:  eyes equal and reactive ENT/NECK:  no LAD, no JVD CARDIO: Regular rate, well-perfused, normal S1 and S2 PULM:  CTAB no wheezing or rhonchi GI/GU:  non-distended, non-tender MSK/SPINE:  No gross deformities, no edema SKIN:  no rash, atraumatic   *Additional and/or pertinent findings included in MDM below  Diagnostic and Interventional Summary    EKG Interpretation Date/Time:    Ventricular Rate:    PR Interval:    QRS Duration:    QT Interval:    QTC Calculation:   R Axis:      Text Interpretation:         Labs Reviewed  URINALYSIS, ROUTINE W REFLEX MICROSCOPIC - Abnormal; Notable for the following components:      Result Value   Leukocytes,Ua SMALL (*)    All other components within normal limits  CBC - Abnormal; Notable for the following components:   Hemoglobin 10.3 (*)    HCT 32.7 (*)    All other components within normal limits  URINALYSIS, MICROSCOPIC (REFLEX) - Abnormal; Notable for the following components:   Bacteria, UA RARE (*)    All other components within normal limits  I-STAT CHEM 8, ED - Abnormal; Notable for the following components:   Chloride 97 (*)    Creatinine, Ser 1.70 (*)    Hemoglobin 11.9 (*)    HCT 35.0 (*)    All other components within normal limits  LIPASE, BLOOD  HEPATIC FUNCTION PANEL    CT ABDOMEN PELVIS W CONTRAST  Final Result      Medications  dicyclomine (BENTYL) capsule 10 mg (has no administration in time range)  iohexol (OMNIPAQUE) 300 MG/ML solution 100 mL (100 mLs Intravenous Contrast Given 09/28/23 0018)      Procedures  /  Critical Care Procedures  ED Course and Medical Decision Making  Initial Impression and Ddx Differential diagnosis includes diverticulitis, appendicitis, UTI.  Worse when standing up, moving around, could be MSK.  Past medical/surgical history that increases complexity of ED encounter: Diverticulitis  Interpretation of Diagnostics I personally reviewed the laboratory assessment and my interpretation is as follows: No significant blood count or  electrolyte disturbance  CT imaging is without acute process.  Patient Reassessment and Ultimate Disposition/Management     Patient continues to look well with normal vitals, soft abdomen, no emergent process appropriate for discharge.  Patient management required discussion with the following services or consulting groups:  None  Complexity of Problems Addressed Acute illness or injury that poses threat of life of bodily function  Additional Data Reviewed and Analyzed Further history obtained from: Further history from spouse/family member  Additional Factors Impacting ED Encounter Risk Prescriptions  Elmer Sow. Pilar Plate, MD [facility] Emergency [facility] [email or web address]  Final Clinical Impressions(s) / ED Diagnoses     ICD-10-CM   1. Lower abdominal pain  R10.30       ED Discharge Orders          Ordered    dicyclomine (BENTYL) 20 MG tablet  2 times daily        09/28/23 0050             Discharge Instructions Discussed with and Provided to Patient:     Discharge Instructions      You were evaluated in the Emergency Department and after careful evaluation, we did not find any emergent condition requiring admission or further testing in the hospital.  Your exam/testing today is overall reassuring.  Recommend continued follow-up with your primary care doctor to discuss her symptoms.  Can use the Bentyl medication for lower abdominal pain as needed up to twice daily.   Continue Tylenol.  Please return to the Emergency Department if you experience any worsening of your condition.   Thank you for allowing [place] to be a part of your care.       Sabas Sous, MD 09/28/23 [phone]

## 2023-09-28 NOTE — Discharge Instructions (Signed)
You were evaluated in the Emergency Department and after careful evaluation, we did not find any emergent condition requiring admission or further testing in the hospital.  Your exam/testing today is overall reassuring.  Recommend continued follow-up with your primary care doctor to discuss her symptoms.  Can use the Bentyl medication for lower abdominal pain as needed up to twice daily.  Continue Tylenol.  Please return to the Emergency Department if you experience any worsening of your condition.   Thank you for allowing [place] to be a part of your care.

## 2024-09-14 ENCOUNTER — Other Ambulatory Visit: Payer: Self-pay

## 2024-09-14 ENCOUNTER — Encounter (HOSPITAL_BASED_OUTPATIENT_CLINIC_OR_DEPARTMENT_OTHER): Payer: Self-pay

## 2024-09-14 ENCOUNTER — Emergency Department (HOSPITAL_BASED_OUTPATIENT_CLINIC_OR_DEPARTMENT_OTHER)
Admission: EM | Admit: 2024-09-14 | Discharge: 2024-09-15 | Disposition: A | Discharge status: Home / Self Care | Attending: Emergency Medicine | Admitting: Emergency Medicine

## 2024-09-14 ENCOUNTER — Emergency Department (HOSPITAL_BASED_OUTPATIENT_CLINIC_OR_DEPARTMENT_OTHER)

## 2024-09-14 DIAGNOSIS — R42 Dizziness and giddiness: Secondary | ICD-10-CM | POA: Diagnosis present

## 2024-09-14 DIAGNOSIS — Z79899 Other long term (current) drug therapy: Secondary | ICD-10-CM | POA: Diagnosis not present

## 2024-09-14 DIAGNOSIS — I951 Orthostatic hypotension: Secondary | ICD-10-CM | POA: Insufficient documentation

## 2024-09-14 DIAGNOSIS — R072 Precordial pain: Secondary | ICD-10-CM | POA: Insufficient documentation

## 2024-09-14 DIAGNOSIS — E871 Hypo-osmolality and hyponatremia: Secondary | ICD-10-CM | POA: Diagnosis not present

## 2024-09-14 DIAGNOSIS — I10 Essential (primary) hypertension: Secondary | ICD-10-CM | POA: Diagnosis not present

## 2024-09-14 DIAGNOSIS — E860 Dehydration: Secondary | ICD-10-CM

## 2024-09-14 LAB — BASIC METABOLIC PANEL WITH GFR
Anion gap: 12 (ref 5–15)
BUN: 28 mg/dL — ABNORMAL HIGH (ref 8–23)
CO2: 25 mmol/L (ref 22–32)
Calcium: 9.7 mg/dL (ref 8.9–10.3)
Chloride: 96 mmol/L — ABNORMAL LOW (ref 98–111)
Creatinine, Ser: 1.22 mg/dL — ABNORMAL HIGH (ref 0.44–1.00)
GFR, Estimated: 48 mL/min — ABNORMAL LOW (ref >60)
Glucose, Bld: 108 mg/dL — ABNORMAL HIGH (ref 70–99)
Potassium: 3.7 mmol/L (ref 3.5–5.1)
Sodium: 133 mmol/L — ABNORMAL LOW (ref 135–145)

## 2024-09-14 LAB — CBC WITH DIFFERENTIAL/PLATELET
Abs Immature Granulocytes: 0.02 K/uL (ref 0.00–0.07)
Basophils Absolute: 0 K/uL (ref 0.0–0.1)
Basophils Relative: 1 %
Eosinophils Absolute: 0.1 K/uL (ref 0.0–0.5)
Eosinophils Relative: 1 %
HCT: 35.9 % — ABNORMAL LOW (ref 36.0–46.0)
Hemoglobin: 11.5 g/dL — ABNORMAL LOW (ref 12.0–15.0)
Immature Granulocytes: 0 %
Lymphocytes Relative: 30 %
Lymphs Abs: 1.9 K/uL (ref 0.7–4.0)
MCH: 26.3 pg (ref 26.0–34.0)
MCHC: 32 g/dL (ref 30.0–36.0)
MCV: 82.2 fL (ref 80.0–100.0)
Monocytes Absolute: 0.7 K/uL (ref 0.1–1.0)
Monocytes Relative: 11 %
Neutro Abs: 3.6 K/uL (ref 1.7–7.7)
Neutrophils Relative %: 57 %
Platelets: 400 K/uL (ref 150–400)
RBC: 4.37 MIL/uL (ref 3.87–5.11)
RDW: 15.1 % (ref 11.5–15.5)
WBC: 6.3 K/uL (ref 4.0–10.5)
nRBC: 0 % (ref 0.0–0.2)

## 2024-09-14 LAB — CBG MONITORING, ED: Glucose-Capillary: 128 mg/dL — ABNORMAL HIGH (ref 70–99)

## 2024-09-14 LAB — TROPONIN T, HIGH SENSITIVITY: Troponin T High Sensitivity: <15 ng/L (ref 0–19)

## 2024-09-14 NOTE — ED Triage Notes (Addendum)
 Pt was seen at [facility] on Sunday dx with vertigo and sinus infection Pt states she has had dizziness over a week, tonight became diaphoretic, nauseated and had CP. Was evaluated by EMS and sent here [facility] WNLs in triage

## 2024-09-14 NOTE — ED Notes (Signed)
 Patient transported to CT

## 2024-09-15 LAB — TROPONIN T, HIGH SENSITIVITY: Troponin T High Sensitivity: <15 ng/L (ref 0–19)

## 2024-09-15 MED ORDER — SODIUM CHLORIDE 0.9 % IV BOLUS
500.0000 mL | Freq: Once | INTRAVENOUS | Status: AC
  Administered 2024-09-15: 500 mL via INTRAVENOUS

## 2024-09-15 NOTE — ED Provider Notes (Signed)
 [place] EMERGENCY DEPARTMENT AT [facility] Provider Note   CSN: [id number] Arrival date & time: 09/14/24  2025     Patient presents with: Dizziness and Chest Pain   Nancy Tran is a 68 y.o. female.   The history is provided by the patient.  Dizziness Quality:  Lightheadedness Severity:  Moderate Onset quality:  Gradual Progression:  Waxing and waning Chronicity:  New Context: not with physical activity and not when urinating   Relieved by:  Nothing Worsened by:  Nothing Associated symptoms: chest pain   Associated symptoms: no palpitations, no shortness of breath, no syncope and no weakness   Risk factors: no hx of stroke, no hx of vertigo and no Meniere's disease   Chest Pain Pain location:  Substernal area Pain quality: dull   Pain radiates to:  Does not radiate Pain severity:  Moderate Onset quality:  Gradual Duration:  8 hours Timing:  Constant Chronicity:  New Context: at rest   Relieved by:  Nothing Worsened by:  Nothing Ineffective treatments:  None tried Associated symptoms: no abdominal pain, no altered mental status, no back pain, no cough, no fever, no lower extremity edema, no near-syncope, no numbness, no orthopnea, no palpitations, no PND, no shortness of breath, no syncope and no weakness   Patient reports lightheadedness since beginning of November and then went to urgent care and was diagnosed with vertigo though denies spinning and sinus infection for which she was started on antibiotics.  No weakness no numbness. No changes in vision or speech.  Tonight also had chest discomfort.  No SOB,  no DOE.  No emesis.      Past Medical History:  Diagnosis Date   Adenoma of colon 2007   Adenomatous polyps in 2007, 2012, 2017.  GI MD is Tri Le in [place].     Complication of anesthesia    Diverticulitis 12/2019   Gastritis, erosive 08/2018   EGD by Dr Gunnar Daniels.     High cholesterol    History of cerebral aneurysm    bil internal  carotid artery aneurysms.   embollization and stent to left 12/29/19 at [facility].     Hypertension    Normocytic anemia 2016   PONV (postoperative nausea and vomiting)    Tubal ligation status      Prior to Admission medications   Medication Sig Start Date End Date Taking? Authorizing Provider  baclofen  (LIORESAL ) 10 MG tablet Take 5-10 mg by mouth every 8 (eight) hours as needed (pain). 11/10/19   [provider]  dicyclomine  (BENTYL ) 20 MG tablet Take 1 tablet (20 mg total) by mouth 2 (two) times daily. 09/28/23   Theadore Ozell HERO, MD  ferrous sulfate  325 (65 FE) MG tablet Take 1 tablet (325 mg total) by mouth 2 (two) times daily with a meal. 12/21/20   Samtani, Jai-Gurmukh, MD  Multiple Vitamin (MULTIVITAMIN WITH MINERALS) TABS tablet Take 1 tablet by mouth daily.    [provider]  omeprazole (PRILOSEC) 40 MG capsule Take 40 mg by mouth daily. 12/20/19   [provider]    Allergies: Codeine, Darvocet [propoxyphene n-acetaminophen ], Hydrocodone -acetaminophen , Lisinopril, Macrobid [nitrofurantoin macrocrystal], Morphine and codeine, Percocet [oxycodone-acetaminophen ], Amoxicillin-pot clavulanate, and Penicillins    Review of Systems  Constitutional:  Negative for fever.  Eyes:  Negative for visual disturbance.  Respiratory:  Negative for cough and shortness of breath.   Cardiovascular:  Positive for chest pain. Negative for palpitations, orthopnea, syncope, PND and near-syncope.  Gastrointestinal:  Negative for abdominal pain.  Musculoskeletal:  Negative for back pain.  Neurological:  Positive for light-headedness. Negative for tremors, syncope, facial asymmetry, speech difficulty, weakness and numbness.  All other systems reviewed and are negative.   Updated Vital Signs BP 117/60   Pulse 69   Temp 98.2 F (36.8 C) (Oral)   Resp 18   Ht 5' 7 (1.702 m)   Wt 87.1 kg   SpO2 100%   BMI 30.07 kg/m   Physical Exam Vitals and nursing note reviewed.   Constitutional:      General: She is not in acute distress.    Appearance: She is well-developed.  HENT:     Head: Normocephalic and atraumatic.     Nose: Nose normal.  Eyes:     Pupils: Pupils are equal, round, and reactive to light.  Cardiovascular:     Rate and Rhythm: Normal rate and regular rhythm.     Pulses: Normal pulses.     Heart sounds: Normal heart sounds.  Pulmonary:     Effort: Pulmonary effort is normal. No respiratory distress.     Breath sounds: Normal breath sounds.  Abdominal:     General: Bowel sounds are normal. There is no distension.     Palpations: Abdomen is soft.     Tenderness: There is no abdominal tenderness. There is no guarding or rebound.  Musculoskeletal:        General: Normal range of motion.     Cervical back: Normal range of motion and neck supple.  Skin:    General: Skin is warm and dry.     Capillary Refill: Capillary refill takes less than 2 seconds.     Findings: No erythema or rash.  Neurological:     General: No focal deficit present.     Mental Status: She is oriented to person, place, and time.     Deep Tendon Reflexes: Reflexes normal.  Psychiatric:        Mood and Affect: Mood normal.     (all labs ordered are listed, but only abnormal results are displayed) Results for orders placed or performed during the hospital encounter of 09/14/24  Basic metabolic panel   Collection Time: 09/14/24  8:49 PM  Result Value Ref Range   Sodium 133 (L) 135 - 145 mmol/L   Potassium 3.7 3.5 - 5.1 mmol/L   Chloride 96 (L) 98 - 111 mmol/L   CO2 25 22 - 32 mmol/L   Glucose, Bld 108 (H) 70 - 99 mg/dL   BUN 28 (H) 8 - 23 mg/dL   Creatinine, Ser 8.77 (H) 0.44 - 1.00 mg/dL   Calcium 9.7 8.9 - 89.6 mg/dL   GFR, Estimated 48 (L) >60 mL/min   Anion gap 12 5 - 15  CBC with Differential   Collection Time: 09/14/24  8:49 PM  Result Value Ref Range   WBC 6.3 4.0 - 10.5 K/uL   RBC 4.37 3.87 - 5.11 MIL/uL   Hemoglobin 11.5 (L) 12.0 - 15.0 g/dL    HCT 64.0 (L) 63.9 - 46.0 %   MCV 82.2 80.0 - 100.0 fL   MCH 26.3 26.0 - 34.0 pg   MCHC 32.0 30.0 - 36.0 g/dL   RDW 84.8 88.4 - 84.4 %   Platelets 400 150 - 400 K/uL   nRBC 0.0 0.0 - 0.2 %   Neutrophils Relative % 57 %   Neutro Abs 3.6 1.7 - 7.7 K/uL   Lymphocytes Relative 30 %   Lymphs Abs  1.9 0.7 - 4.0 K/uL   Monocytes Relative 11 %   Monocytes Absolute 0.7 0.1 - 1.0 K/uL   Eosinophils Relative 1 %   Eosinophils Absolute 0.1 0.0 - 0.5 K/uL   Basophils Relative 1 %   Basophils Absolute 0.0 0.0 - 0.1 K/uL   Immature Granulocytes 0 %   Abs Immature Granulocytes 0.02 0.00 - 0.07 K/uL  CBG monitoring, ED   Collection Time: 09/14/24  8:49 PM  Result Value Ref Range   Glucose-Capillary 128 (H) 70 - 99 mg/dL  Troponin T, High Sensitivity   Collection Time: 09/14/24  8:49 PM  Result Value Ref Range   Troponin T High Sensitivity <15 0 - 19 ng/L  Troponin T, High Sensitivity   Collection Time: 09/15/24 12:34 AM  Result Value Ref Range   Troponin T High Sensitivity <15 0 - 19 ng/L   CT Head Wo Contrast Result Date: 09/14/2024 CLINICAL DATA:  Dizziness with nausea, diaphoresis and chest pain. EXAM: CT HEAD WITHOUT CONTRAST TECHNIQUE: Contiguous axial images were obtained from the base of the skull through the vertex without intravenous contrast. RADIATION DOSE REDUCTION: This exam was performed according to the departmental dose-optimization program which includes automated exposure control, adjustment of the mA and/or kV according to patient size and/or use of iterative reconstruction technique. COMPARISON:  Mar 26, 2022 FINDINGS: Brain: No evidence of acute infarction, hemorrhage, hydrocephalus, extra-axial collection or mass lesion/mass effect. Vascular: A left cavernous carotid artery stent is noted. Skull: Normal. Negative for fracture or focal lesion. Sinuses/Orbits: No acute finding. Other: None. IMPRESSION: No acute intracranial abnormality. Electronically Signed   By: Suzen Dials  M.D.   On: 09/14/2024 23:46   DG Chest 2 View Result Date: 09/14/2024 EXAM: 2 VIEW(S) XRAY OF THE CHEST 09/14/2024 09:11:00 PM COMPARISON: None available. CLINICAL HISTORY: cp FINDINGS: LUNGS AND PLEURA: No focal pulmonary opacity. No pleural effusion. No pneumothorax. HEART AND MEDIASTINUM: No acute abnormality of the cardiac and mediastinal silhouettes. BONES AND SOFT TISSUES: No acute osseous abnormality. IMPRESSION: 1. No acute process. Electronically signed by: Dorethia Molt MD 09/14/2024 09:41 PM EST RP Workstation: [id number]     EKG:  Date: 09/15/2024  Rate: 67  Rhythm: normal sinus rhythm  QRS Axis: normal  Intervals: normal  ST/T Wave abnormalities: normal  Conduction Disutrbances: none  Narrative Interpretation: unremarkable     Radiology: CT Head Wo Contrast Result Date: 09/14/2024 CLINICAL DATA:  Dizziness with nausea, diaphoresis and chest pain. EXAM: CT HEAD WITHOUT CONTRAST TECHNIQUE: Contiguous axial images were obtained from the base of the skull through the vertex without intravenous contrast. RADIATION DOSE REDUCTION: This exam was performed according to the departmental dose-optimization program which includes automated exposure control, adjustment of the mA and/or kV according to patient size and/or use of iterative reconstruction technique. COMPARISON:  Mar 26, 2022 FINDINGS: Brain: No evidence of acute infarction, hemorrhage, hydrocephalus, extra-axial collection or mass lesion/mass effect. Vascular: A left cavernous carotid artery stent is noted. Skull: Normal. Negative for fracture or focal lesion. Sinuses/Orbits: No acute finding. Other: None. IMPRESSION: No acute intracranial abnormality. Electronically Signed   By: Suzen Dials M.D.   On: 09/14/2024 23:46   DG Chest 2 View Result Date: 09/14/2024 EXAM: 2 VIEW(S) XRAY OF THE CHEST 09/14/2024 09:11:00 PM COMPARISON: None available. CLINICAL HISTORY: cp FINDINGS: LUNGS AND PLEURA: No focal pulmonary opacity.  No pleural effusion. No pneumothorax. HEART AND MEDIASTINUM: No acute abnormality of the cardiac and mediastinal silhouettes. BONES AND SOFT TISSUES: No acute osseous abnormality.  IMPRESSION: 1. No acute process. Electronically signed by: Dorethia Molt MD 09/14/2024 09:41 PM EST RP Workstation: [id number]     Procedures   Medications Ordered in the ED  sodium chloride  0.9 % bolus 500 mL (0 mLs Intravenous Stopped 09/15/24 0137)                                    Medical Decision Making Patient with lightheadedness and then tonight chest pain   Amount and/or Complexity of Data Reviewed Independent Historian: spouse    Details: See above  External Data Reviewed: notes.    Details: Previous notes reviewed  Labs: ordered.    Details: Normal white count 6.3, low hemoglobin 11.5 ,normal platelets.  Normal troponin <15 x 2.  Sodium slight low 133, elevated creatinine 1.22  Radiology: ordered and independent interpretation performed.    Details: Normal head CT  Risk Risk Details: Head CT is normal without signs of sinus infection.  History is not consistent with sinus infection.  Ruled out for MI in the ED, heart score is 2. No exertional symptoms.  Likely GERD.  Meclizine  will not help with lightheadeness.  Patient given fluids for lightheadedness and orthostasis as well increase in creatinine, secondary to mild dehydration.  I do not believe this is a PE.  Patient is stable for discharge.  Follow up with PMD.  Strict returns.       Final diagnoses:  Orthostasis  Precordial pain   No signs of systemic illness or infection. The patient is nontoxic-appearing on exam and vital signs are within normal limits.  I have reviewed the triage vital signs and the nursing notes. Pertinent labs & imaging results that were available during my care of the patient were reviewed by me and considered in my medical decision making (see chart for details). After history, exam, and medical workup I feel the  patient has been appropriately medically screened and is safe for discharge home. Pertinent diagnoses were discussed with the patient. Patient was given return precautions.  ED Discharge Orders     None          Mercury Rock, MD 09/15/24 [phone]

## 2024-09-15 NOTE — ED Notes (Signed)
Shift report received, assumed care of patient at this time
